# Patient Record
Sex: Female | Born: 1945
Health system: Southern US, Community
[De-identification: ages and names within clinical notes are randomized; demographics above are authoritative.]

## PROBLEM LIST (undated history)

## (undated) DIAGNOSIS — B029 Zoster without complications: Secondary | ICD-10-CM

## (undated) DIAGNOSIS — R000 Tachycardia, unspecified: Secondary | ICD-10-CM

## (undated) DIAGNOSIS — I4711 Inappropriate sinus tachycardia, so stated: Secondary | ICD-10-CM

## (undated) DIAGNOSIS — K529 Noninfective gastroenteritis and colitis, unspecified: Secondary | ICD-10-CM

## (undated) DIAGNOSIS — M109 Gout, unspecified: Secondary | ICD-10-CM

## (undated) DIAGNOSIS — K219 Gastro-esophageal reflux disease without esophagitis: Secondary | ICD-10-CM

## (undated) DIAGNOSIS — H409 Unspecified glaucoma: Secondary | ICD-10-CM

## (undated) DIAGNOSIS — Z85828 Personal history of other malignant neoplasm of skin: Secondary | ICD-10-CM

## (undated) DIAGNOSIS — M1990 Unspecified osteoarthritis, unspecified site: Secondary | ICD-10-CM

## (undated) DIAGNOSIS — F419 Anxiety disorder, unspecified: Secondary | ICD-10-CM

## (undated) DIAGNOSIS — Q872 Congenital malformation syndromes predominantly involving limbs: Secondary | ICD-10-CM

## (undated) HISTORY — DX: Noninfective gastroenteritis and colitis, unspecified: K52.9

## (undated) HISTORY — DX: Congenital malformation syndromes predominantly involving limbs: Q87.2

## (undated) HISTORY — DX: Tachycardia, unspecified: R00.0

## (undated) HISTORY — DX: Inappropriate sinus tachycardia, so stated: I47.11

## (undated) HISTORY — DX: Personal history of other malignant neoplasm of skin: Z85.828

## (undated) HISTORY — DX: Zoster without complications: B02.9

## (undated) HISTORY — DX: Gastro-esophageal reflux disease without esophagitis: K21.9

## (undated) HISTORY — PX: TONSILLECTOMY: SUR1361

## (undated) HISTORY — DX: Gout, unspecified: M10.9

## (undated) HISTORY — PX: CATARACT EXTRACTION: SUR2

## (undated) HISTORY — PX: KNEE SURGERY: SHX244

## (undated) HISTORY — PX: OTHER SURGICAL HISTORY: SHX169

---

## 1980-01-25 HISTORY — PX: CHOLECYSTECTOMY: SHX55

## 2006-05-29 ENCOUNTER — Encounter: Payer: Self-pay | Admitting: Cardiology

## 2007-06-04 ENCOUNTER — Encounter: Payer: Self-pay | Admitting: Cardiology

## 2007-07-10 ENCOUNTER — Ambulatory Visit: Payer: Self-pay | Admitting: Cardiology

## 2007-07-20 ENCOUNTER — Ambulatory Visit: Payer: Self-pay | Admitting: Cardiology

## 2007-08-09 ENCOUNTER — Ambulatory Visit: Payer: Self-pay | Admitting: Cardiology

## 2008-09-18 ENCOUNTER — Encounter (INDEPENDENT_AMBULATORY_CARE_PROVIDER_SITE_OTHER): Payer: Self-pay | Admitting: *Deleted

## 2010-06-08 NOTE — Assessment & Plan Note (Signed)
[facility]                          [facility] OFFICE NOTE   Kathryn, Meyers                     MRN:          [id number]  DATE:07/10/2007                            DOB:          1945-03-27    Kathryn Meyers is here for the evaluation of increased heart rate.  Historically, she has had an increased resting heart rate.  For overall  evaluation in 2004, she had a 2D echo showing normal LV function.  She  also had a Cardiolite scan.  At the time of that scan, her resting heart  rate was 100.  She exercised to a heart rate of 105% predicted max.  Ejection fraction was 63%, and there were no ischemic abnormalities.   The patient does note some palpitations at times.  However, in general,  she is used to her heart rate.  She says that many many years ago, she  took some shots from a doctor in [place] practicing alternative  medicine and that this helped her heart rate.  However, this was some  type of vaccine that is no longer available.  I made it clear to her  that I could not help with any recommendations in this regard.  Dr.  Margo Common has recommended low-dose beta blockade.  The patient is hesitant  to take any medication that she has to take regularly for this at this  time unless we know why she is having it.  She does not have chest pain.  She does not have syncope.  There is no significant shortness of breath.  I do not have any thyroid studies, but she tells me she thinks they have  been checked through Dr. Margo Common.  Certainly, this would be indicated if  they have not been.  I do not want to repeat any lab studies that she  has already had.   ALLERGIES:  No known drug allergies.   MEDICATIONS:  Sulfamethoxazole for an ongoing respiratory infection that  has been treated with other meds also.   SOCIAL HISTORY:  The patient does smoke.  She smokes one-pack per day.  We had a discussion concerning stopping.  She has not inclined to  do so.   FAMILY HISTORY:  There is a no strong history of coronary artery  disease.   REVIEW OF SYSTEMS:  In general, she does well.  She has some mild  arthritis and some mild anxiety.  She has some mild allergy symptoms.  Otherwise, review of systems is negative.   PHYSICAL EXAMINATION:  VITAL SIGNS:  Blood pressure today is 117/79 with  pulse of 101.  Her weight is 133.  The patient is oriented to person,  time, and place.  Affect is normal.  HEENT:  No xanthelasma.  She has normal extraocular motion.  There are  no carotid bruits.  There is no jugular venous distention.  LUNGS:  Clear.  Respiratory effort is not labored.  CARDIAC:  S1 and S2.  There are no clicks or significant murmurs.  ABDOMEN:  Soft.  EXTREMITIES:  She has no significant peripheral edema.  There are no  major musculoskeletal deformities.   EKG reveals sinus tachycardia with a rate of 102 and nonspecific ST-T  wave changes.   PROBLEMS:  1. Status post tonsillectomy, cholecystectomy, right and left knee      surgery, and C-sections.  2. History of ongoing cigarette use.  Of course, she is encouraged and      counseled to stop smoking.  3. Some mild arthritis.  4. Mild anxiety.  5. *Persistent sinus tachycardia.  By history, she has had this      problem for many years.  In 2004, she had good LV function.  I      discussed with her that in general we would like for her overall      heart rate to be slower.  I told her I could not state any      literature, however, that a heart rate of 100 would necessarily      lead to a cardiomyopathy.  She says that a monitor at one point had      shown that at other parts of the day her heart rate gets slower.      We discussed the fact that a 24-hour monitor would help assess what      her range of heart rate is at this time, so that we could make the      best decisions possible.  Also, a 2D echo would be helpful to be      sure that she has not developed any changes  in her LV function.      These studies will be arranged.  We will see if we can get a TSH      value from Dr. Margo Common.  I told her it will be okay for her to use      the beta blocker on a p.r.n. basis if she feels some palpitations.      I will then see her back, and we will make further decisions about      the approach to her tachycardia.     Luis Abed, MD, [facility]  Electronically Signed    JDK/MedQ  DD: 07/10/2007  DT: 07/11/2007  Job #: [id number]   cc:   Wyvonnia Lora

## 2010-06-08 NOTE — Assessment & Plan Note (Signed)
[facility]                          [facility]   SHENANDOAH, YEATS                     MRN:          [id number]  DATE:08/09/2007                            DOB:          February 27, 1945    Kathryn Meyers is seen for followup.  I saw her last on July 10, 2007.  We  did a 2D echo, and fortunately, she has the continuation of normal LV  function.  I was worried that she could have a cardiomyopathy from her  persistent sinus tachycardia.  This is not the case.  Her ejection  fraction is 60%-65%.  We also did a Holter monitor.  This documents her  heart rates.  She definitely has persistent sinus tachycardia.  Her mean  heart rate for the entire 24-hour period was 111.  It can range in the  range of 115 during the day and at nighttime, the slowest mean rate was  in the range of 89-90.   I had a full discussion with the patient about this today.  She is doing  well.  There is no evidence that she has a rate-related cardiomyopathy.  I told her that in my opinion, I would still like for her to use a beta  blocker.  We will consider using metoprolol succinate 25 mg daily.  We  had a full discussion about this, and once again, we talked about my  request that she stop smoking.   PAST MEDICAL HISTORY:   ALLERGIES:  No known drug allergies.   MEDICATIONS:  Fish oil and multivitamin.   OTHER MEDICAL PROBLEMS:  See the note of July 10, 2007.   REVIEW OF SYSTEMS:  Negative and she feels fine.   PHYSICAL EXAMINATION:  VITAL SIGNS:  Blood pressure today is 127/82, and  her heart rate is 111.  Weight is 136 pounds.  The patient is stable.  LUNGS:  Clear.  CARDIAC:  Reveals an S1 with an S2, but no clicks or significant  murmurs.   Problems are listed on the note of July 10, 2007.  We had a complete  discussion about her persistent sinus tachycardia.  No further workup is  to be done.  I have recommended low-dose long-acting beta  blockade.     Luis Abed, MD, [facility]  Electronically Signed    JDK/MedQ  DD: 08/09/2007  DT: 08/10/2007  Job #: [id number]   cc:   Wyvonnia Lora

## 2010-07-22 ENCOUNTER — Ambulatory Visit (HOSPITAL_COMMUNITY)
Admission: RE | Admit: 2010-07-22 | Discharge: 2010-07-22 | Disposition: A | Discharge status: Home / Self Care | Payer: BC Managed Care – PPO | Source: Ambulatory Visit | Attending: Orthopedic Surgery | Admitting: Orthopedic Surgery

## 2010-07-22 DIAGNOSIS — Z01818 Encounter for other preprocedural examination: Secondary | ICD-10-CM | POA: Insufficient documentation

## 2010-07-22 DIAGNOSIS — IMO0002 Reserved for concepts with insufficient information to code with codable children: Secondary | ICD-10-CM | POA: Insufficient documentation

## 2010-07-22 DIAGNOSIS — Z01812 Encounter for preprocedural laboratory examination: Secondary | ICD-10-CM | POA: Insufficient documentation

## 2010-07-22 DIAGNOSIS — M1710 Unilateral primary osteoarthritis, unspecified knee: Secondary | ICD-10-CM | POA: Insufficient documentation

## 2010-07-22 LAB — CBC
MCH: 29.9 pg (ref 26.0–34.0)
MCHC: 34.2 g/dL (ref 30.0–36.0)
Platelets: 330 10*3/uL (ref 150–400)
RBC: 5.22 MIL/uL — ABNORMAL HIGH (ref 3.87–5.11)
RDW: 13.9 % (ref 11.5–15.5)

## 2010-07-22 LAB — COMPREHENSIVE METABOLIC PANEL
BUN: 8 mg/dL (ref 6–23)
Calcium: 8.8 mg/dL (ref 8.4–10.5)
Creatinine, Ser: 0.47 mg/dL — ABNORMAL LOW (ref 0.50–1.10)
GFR calc Af Amer: >60 mL/min (ref >60)
GFR calc non Af Amer: >60 mL/min (ref >60)
Glucose, Bld: 80 mg/dL (ref 70–99)
Sodium: 142 mEq/L (ref 135–145)
Total Protein: 6.8 g/dL (ref 6.0–8.3)

## 2010-07-22 LAB — DIFFERENTIAL
Basophils Relative: 1 % (ref 0–1)
Eosinophils Absolute: 0.3 10*3/uL (ref 0.0–0.7)
Eosinophils Relative: 2 % (ref 0–5)
Monocytes Relative: 6 % (ref 3–12)
Neutrophils Relative %: 62 % (ref 43–77)

## 2010-07-22 LAB — SURGICAL PCR SCREEN: MRSA, PCR: POSITIVE — AB

## 2010-07-22 LAB — URINALYSIS, ROUTINE W REFLEX MICROSCOPIC
Hgb urine dipstick: NEGATIVE
Ketones, ur: NEGATIVE mg/dL
Protein, ur: NEGATIVE mg/dL
Urobilinogen, UA: 0.2 mg/dL (ref 0.0–1.0)

## 2010-07-22 LAB — PROTIME-INR: Prothrombin Time: 12.7 seconds (ref 11.6–15.2)

## 2010-07-24 LAB — URINE CULTURE

## 2010-08-02 ENCOUNTER — Inpatient Hospital Stay (HOSPITAL_COMMUNITY)
Admission: RE | Admit: 2010-08-02 | Payer: BC Managed Care – PPO | Source: Ambulatory Visit | Admitting: Orthopedic Surgery

## 2011-03-10 ENCOUNTER — Encounter: Payer: Self-pay | Admitting: Cardiology

## 2011-03-10 DIAGNOSIS — Z720 Tobacco use: Secondary | ICD-10-CM | POA: Insufficient documentation

## 2011-03-10 DIAGNOSIS — R000 Tachycardia, unspecified: Secondary | ICD-10-CM | POA: Insufficient documentation

## 2011-03-11 ENCOUNTER — Encounter: Payer: Self-pay | Admitting: Cardiology

## 2011-03-11 ENCOUNTER — Ambulatory Visit (INDEPENDENT_AMBULATORY_CARE_PROVIDER_SITE_OTHER): Payer: BC Managed Care – PPO | Admitting: Cardiology

## 2011-03-11 VITALS — BP 143/88 | HR 112 | Ht <= 58 in | Wt 140.0 lb

## 2011-03-11 DIAGNOSIS — Q872 Congenital malformation syndromes predominantly involving limbs: Secondary | ICD-10-CM

## 2011-03-11 DIAGNOSIS — R002 Palpitations: Secondary | ICD-10-CM

## 2011-03-11 DIAGNOSIS — R9431 Abnormal electrocardiogram [ECG] [EKG]: Secondary | ICD-10-CM

## 2011-03-11 DIAGNOSIS — Q798 Other congenital malformations of musculoskeletal system: Secondary | ICD-10-CM

## 2011-03-11 DIAGNOSIS — Z720 Tobacco use: Secondary | ICD-10-CM

## 2011-03-11 DIAGNOSIS — R000 Tachycardia, unspecified: Secondary | ICD-10-CM

## 2011-03-11 DIAGNOSIS — I498 Other specified cardiac arrhythmias: Secondary | ICD-10-CM

## 2011-03-11 DIAGNOSIS — J329 Chronic sinusitis, unspecified: Secondary | ICD-10-CM

## 2011-03-11 DIAGNOSIS — F17200 Nicotine dependence, unspecified, uncomplicated: Secondary | ICD-10-CM

## 2011-03-11 MED ORDER — AMOXICILLIN 500 MG PO CAPS: 500.0000 mg | ORAL_CAPSULE | Freq: Two times a day (BID) | ORAL | Status: AC

## 2011-03-11 MED ORDER — METOPROLOL TARTRATE 50 MG PO TABS: 50.0000 mg | ORAL_TABLET | Freq: Two times a day (BID) | ORAL | Status: DC

## 2011-03-11 NOTE — Patient Instructions (Signed)
Follow up as scheduled. Stop Atenolol. Start Lopressor (metoprolol) 50 mg two times a day. Decrease caffeine. Your physician discussed the hazards of tobacco use. Tobacco use cessation is recommended and techniques and options to help you quit were discussed.

## 2011-03-11 NOTE — Assessment & Plan Note (Signed)
The patient's white count was elevated in the emergency room although she has no fever. She says she has symptoms of a sinus infection. I was willing to give her a trial of amoxicillin as it has been used in the past. She will see her primary physician for any further followup.

## 2011-03-11 NOTE — Assessment & Plan Note (Signed)
I have urged the patient to stop smoking. We discussed at length. Her husband was in the room and he does not smoke.

## 2011-03-11 NOTE — Assessment & Plan Note (Signed)
There is a history of very mild persistent sinus tachycardia that she has treated with beta blockers for many years. I am adjusting her beta blocker. I will reconsider 2-D echo to followup again to be sure that she has not developed a rate related cardiomyopathy. We know that this was not the case between 2004 and 2009

## 2011-03-11 NOTE — Assessment & Plan Note (Signed)
I was not familiar with this syndrome until the patient mentioned it to me. I have researched it. It is associated with abnormal nails of the hands and of abnormal patellas. In a small percentage of the patient's 13 be significant renal dysfunction. Therefore very careful attention has to be paid to not using any medications that can affect renal function. I cannot find any evidence of cardiac involvement in this autosomal dominant abnormality

## 2011-03-11 NOTE — Assessment & Plan Note (Signed)
The patient has ST scooping throughout her EKG. This is an old finding. It is unchanged.

## 2011-03-11 NOTE — Assessment & Plan Note (Signed)
The patient's primary complaint is palpitations. She has had excess caffeine intake. With this she gets sinus tachycardia. I suspect she also has some PACs or PVCs. I will change her atenolol to Lopressor 50 by mouth twice a day. She is to cut down her caffeine intake. I her sugar to stop smoking. We talked about the possibility of proceeding with a 2-D echo and an event recorder. However I do not feel either of these are immediately necessary as we have data from the past. She was willing to see how she feels with decreased caffeine and adjustment to her beta blocker. I will see her for followup and we will make further decisions.

## 2011-03-11 NOTE — Progress Notes (Signed)
HPI Patient is seen today to evaluate Palpitations. I had seen her in 2004 and 2009. She had Sinus tachycardia at that time.. She's had normal LV function and her ejection fraction in 2004 was 63% By echo.. She had a stress Nuclear scan in 2004  that showed no significant abnormalities. With recurrent problems in 2009 she had a followup echo again showing normal function with an ejection fraction of 60%. She wore a Holter monitor. She had persistent sinus tachycardia. The rates range from 90-115 with an average of about 105. I recommended that we try a small dose of beta blocker at that time. She has been on atenolol.  The patient was scheduled to see me today. Yesterday she went to the ER with some recurrent palpitations. In the ER she had some sinus tachycardia. Her potassium was 3.4. She was felt to be stable. Her initial EKG revealed sinus tachycardia with a rate of 120. Over time this slowed to a rate of 90. She does have nonspecific ST scooping on her EKG. Chest x-ray revealed slight hyperinflation. She says that she has felt palpitations in general over the past few weeks. She has not had any syncope or presyncope.  As part of today's evaluation I have reviewed old records from the old chart of 2009. I've carefully up dated the new electronic medical record. I have reviewed the hospital records from yesterday including the notes and x-ray and labs. I also spent greater than an hour talking with the patient and her husband and reviewing all the data.  An additional problem is that the patient thinks she has sinusitis. Her white blood count was mildly elevated in the emergency room yesterday.  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 Patient's have absent or abnormal fingernails. There is absent hypoplastic patella of the knee. Elbow joints can be abnormal. A small percentage of the patients can have significant renal dysfunction. Results for orders  placed during the hospital encounter of 07/22/10  URINALYSIS, ROUTINE W REFLEX MICROSCOPIC      Component Value Range   Color, Urine YELLOW  YELLOW    APPearance CLEAR  CLEAR    Specific Gravity, Urine 1.016  1.005 - 1.030    pH 6.0  5.0 - 8.0    Glucose, UA NEGATIVE  NEGATIVE (mg/dL)   Hgb urine dipstick NEGATIVE  NEGATIVE    Bilirubin Urine NEGATIVE  NEGATIVE    Ketones, ur NEGATIVE  NEGATIVE (mg/dL)   Protein, ur NEGATIVE  NEGATIVE (mg/dL)   Urobilinogen, UA 0.2  0.0 - 1.0 (mg/dL)   Nitrite NEGATIVE  NEGATIVE    Leukocytes, UA NEGATIVE  NEGATIVE   DIFFERENTIAL      Component Value Range   Neutrophils Relative 62  43 - 77 (%)   Neutro Abs 7.6  1.7 - 7.7 (K/uL)   Lymphocytes Relative 28  12 - 46 (%)   Lymphs Abs 3.4  0.7 - 4.0 (K/uL)   Monocytes Relative 6  3 - 12 (%)   Monocytes Absolute 0.8  0.1 - 1.0 (K/uL)   Eosinophils Relative 2  0 - 5 (%)   Eosinophils Absolute 0.3  0.0 - 0.7 (K/uL)   Basophils Relative 1  0 - 1 (%)   Basophils Absolute 0.1  0.0 - 0.1 (K/uL)  CBC      Component Value Range   WBC 12.1 (*) 4.0 - 10.5 (K/uL)   RBC 5.22 (*) 3.87 - 5.11 (MIL/uL)  Hemoglobin 15.6 (*) 12.0 - 15.0 (g/dL)   HCT 09.6  04.5 - 40.9 (%)   MCV 87.4  78.0 - 100.0 (fL)   MCH 29.9  26.0 - 34.0 (pg)   MCHC 34.2  30.0 - 36.0 (g/dL)   RDW 81.1  91.4 - 78.2 (%)   Platelets 330  150 - 400 (K/uL)  PROTIME-INR      Component Value Range   Prothrombin Time 12.7  11.6 - 15.2 (seconds)   INR 0.93  0.00 - 1.49   APTT      Component Value Range   aPTT 29  24 - 37 (seconds)  COMPREHENSIVE METABOLIC PANEL      Component Value Range   Sodium 142  135 - 145 (mEq/L)   Potassium 3.6  3.5 - 5.1 (mEq/L)   Chloride 104  96 - 112 (mEq/L)   CO2 29  19 - 32 (mEq/L)   Glucose, Bld 80  70 - 99 (mg/dL)   BUN 8  6 - 23 (mg/dL)   Creatinine, Ser 9.56 (*) 0.50 - 1.10 (mg/dL)   Calcium 8.8  8.4 - 21.3 (mg/dL)   Total Protein 6.8  6.0 - 8.3 (g/dL)   Albumin 3.8  3.5 - 5.2 (g/dL)   AST 22  0 - 37  (U/L)   ALT 26  0 - 35 (U/L)   Alkaline Phosphatase 117  39 - 117 (U/L)   Total Bilirubin 0.3  0.3 - 1.2 (mg/dL)   GFR calc non Af Amer >60  >60 (mL/min)   GFR calc Af Amer >60  >60 (mL/min)  SURGICAL PCR SCREEN      Component Value Range   MRSA, PCR POSITIVE (*) NEGATIVE    Staphylococcus aureus POSITIVE (*) NEGATIVE   URINE CULTURE      Component Value Range   Specimen Description URINE, CLEAN CATCH     Special Requests NONE     Culture  Setup Time [id number]     Colony Count 15,000 COLONIES/ML     Culture       Value: Multiple bacterial morphotypes present, none predominant. Suggest appropriate recollection if clinically indicated.   Report Status 07/24/2010 FINAL      Allergies  Allergen Reactions  . Nsaids     Current Outpatient Prescriptions  Medication Sig Dispense Refill  . atenolol (TENORMIN) 50 MG tablet Take 50 mg by mouth daily.        History   Social History  . Marital Status: Married    Spouse Name: N/A    Number of Children: N/A  . Years of Education: N/A   Occupational History  . Not on file.   Social History Main Topics  . Smoking status: Current Everyday Smoker -- 1.0 packs/day    Types: Cigarettes  . Smokeless tobacco: Never Used  . Alcohol Use: No  . Drug Use: No  . Sexually Active: Not on file   Other Topics Concern  . Not on file   Social History Narrative  . No narrative on file    No family history on file.  Past Medical History  Diagnosis Date  . Ejection fraction     EF 60-65%, 2009  . Sinus tachycardia     Mild persistent sinus tachycardia 2009,   . Tobacco abuse     No past surgical history on file.  ROS  Patient denies fever, chills, headache, sweats, rash, change in vision, change in hearing, chest pain, cough, nausea vomiting, urinary symptoms. All other  systems are reviewed and are negative.  PHYSICAL EXAM Patient is overweight. She is oriented to person time and place. Affect is normal. She is here with her  husband. Head is atraumatic. There is no xanthelasma. There is no jugular venous distention. Lungs are clear. Respiratory effort is nonlabored. Cardiac exam reveals S1 and S2. There no clicks or significant murmurs. The abdomen is soft. There is no peripheral edema. There no skin rashes. The patient does have an absent thumb nail. She has prosthetic nails. She does not have a patella. Her elbow joints are abnormal. Filed Vitals:   03/11/11 1320  BP: 143/88  Pulse: 112  Height: 4\' 10"  (1.473 m)  Weight: 140 lb (63.504 kg)   EKG was not done in the office today. I have to EKG from the emergency room from March 10, 2011. The first EKG reveals sinus tachycardia. Second EKG shows normal sinus rhythm. There is diffuse ST scooping. This is unchanged since the tracing of 2009.  ASSESSMENT & PLAN

## 2011-04-06 ENCOUNTER — Ambulatory Visit: Payer: BC Managed Care – PPO | Admitting: Cardiology

## 2011-04-08 ENCOUNTER — Telehealth: Payer: Self-pay | Admitting: *Deleted

## 2011-04-08 NOTE — Telephone Encounter (Signed)
Pt left a message on nurse's voicemail asking for a return call regarding what medications she can take with her metoprolol.   Spoke with pt who states she thinks she is getting congestion in her chest. She wants to know what she can take with the metoprolol. Pt specifically asking if she can take pseudoephedrine. Pt notified there should w/her h/o ST she may want to avoid pseudoephedrine as it may make her heart race. Pt verbalized understanding.

## 2011-04-14 ENCOUNTER — Ambulatory Visit: Payer: BC Managed Care – PPO | Admitting: Cardiology

## 2011-06-01 ENCOUNTER — Ambulatory Visit: Payer: BC Managed Care – PPO | Admitting: Cardiology

## 2011-06-07 ENCOUNTER — Ambulatory Visit (INDEPENDENT_AMBULATORY_CARE_PROVIDER_SITE_OTHER): Payer: BC Managed Care – PPO | Admitting: Cardiology

## 2011-06-07 ENCOUNTER — Encounter: Payer: Self-pay | Admitting: Cardiology

## 2011-06-07 VITALS — BP 144/87 | HR 109 | Resp 16 | Ht <= 58 in | Wt 141.0 lb

## 2011-06-07 DIAGNOSIS — Z720 Tobacco use: Secondary | ICD-10-CM

## 2011-06-07 DIAGNOSIS — F17200 Nicotine dependence, unspecified, uncomplicated: Secondary | ICD-10-CM

## 2011-06-07 DIAGNOSIS — R000 Tachycardia, unspecified: Secondary | ICD-10-CM

## 2011-06-07 DIAGNOSIS — R002 Palpitations: Secondary | ICD-10-CM

## 2011-06-07 DIAGNOSIS — I498 Other specified cardiac arrhythmias: Secondary | ICD-10-CM

## 2011-06-07 NOTE — Assessment & Plan Note (Signed)
Patient continues to smoke. As always she is encouraged to stop.

## 2011-06-07 NOTE — Assessment & Plan Note (Signed)
She is not having any marked palpitations.

## 2011-06-07 NOTE — Progress Notes (Signed)
   HPI  Patient is seen today to followup palpitations and sinus tachycardia. I had changed her atenolol to Lopressor. She is still only on small dose. She says she's having some mild dizziness. This sounds mostly like mild vertigo. I do not think it is related to the Lopressor or her cardiac status in general. The patient takes several supplements. We have reviewed some of them . I've encouraged her to use as little as possible.  Allergies  Allergen Reactions  . Nsaids     Current Outpatient Prescriptions  Medication Sig Dispense Refill  . Acetaminophen (PAIN RELIEF PO) Take by mouth as needed.      . metoprolol (LOPRESSOR) 50 MG tablet Take 1 tablet (50 mg total) by mouth 2 (two) times daily.  60 tablet  6  . naproxen sodium (ANAPROX) 220 MG tablet Take 220 mg by mouth as needed.        History   Social History  . Marital Status: Married    Spouse Name: N/A    Number of Children: N/A  . Years of Education: N/A   Occupational History  . Not on file.   Social History Main Topics  . Smoking status: Current Everyday Smoker -- 1.0 packs/day    Types: Cigarettes  . Smokeless tobacco: Never Used  . Alcohol Use: No  . Drug Use: No  . Sexually Active: Not on file   Other Topics Concern  . Not on file   Social History Narrative  . No narrative on file    No family history on file.  Past Medical History  Diagnosis Date  . Ejection fraction     EF 60-65%, 2009  . Sinus tachycardia     Mild persistent sinus tachycardia 2009,   . Tobacco abuse   . Palpitations     February, 2013  . Sinus infection     February, 2013  . Nail patellar syndrome     Abnormal nails of the hands usually the thumb, absent or hypertrophic patella,  abnormal elbows, autosomal dominant, must be very careful with renal function  . Abnormal EKG     Old diffuse ST scooping    No past surgical history on file.  ROS  Patient denies fever, chills, headache, sweats, rash, change in vision, change  in hearing, chest pain, cough, nausea vomiting, urinary symptoms. All of the systems are reviewed and are negative.  PHYSICAL EXAM  Patient is here with her husband. She is oriented to person time and place. Affect is normal. Head is atraumatic. There is no jugulovenous distention. There no carotid bruits. Lungs are clear. Respiratory effort is nonlabored. Cardiac exam reveals S1 and S2. There no clicks or significant murmurs. The abdomen is soft. There's no peripheral edema.  Filed Vitals:   06/07/11 1436  BP: 144/87  Pulse: 109  Resp: 16  Height: 4\' 10"  (1.473 m)  Weight: 141 lb (63.957 kg)    ASSESSMENT & PLAN

## 2011-06-07 NOTE — Assessment & Plan Note (Signed)
Patient continues to have mild persistent sinus tachycardia. Would like to try will higher dose of beta blocker but she is hesitant. I do want to proceed with 2-D echo to be sure that were not seen left ventricular dysfunction. Otherwise no change.

## 2011-06-07 NOTE — Patient Instructions (Signed)
Your physician recommends that you schedule a follow-up appointment in: 3-5 weeks with Dr. Myrtis Ser. You will be given an appointment at the check out desk today.  Your physician recommends that you continue on your current medications as directed. Please refer to the Current Medication list given to you today.   Your physician has requested that you have an echocardiogram. Echocardiography is a painless test that uses sound waves to create images of your heart. It provides your doctor with information about the size and shape of your heart and how well your heart's chambers and valves are working. This procedure takes approximately one hour. There are no restrictions for this procedure.

## 2011-06-16 ENCOUNTER — Other Ambulatory Visit: Payer: BC Managed Care – PPO

## 2011-06-23 ENCOUNTER — Other Ambulatory Visit (INDEPENDENT_AMBULATORY_CARE_PROVIDER_SITE_OTHER): Payer: BC Managed Care – PPO

## 2011-06-23 ENCOUNTER — Other Ambulatory Visit: Payer: Self-pay

## 2011-06-23 DIAGNOSIS — R002 Palpitations: Secondary | ICD-10-CM

## 2011-06-23 DIAGNOSIS — R000 Tachycardia, unspecified: Secondary | ICD-10-CM

## 2011-07-01 ENCOUNTER — Telehealth: Payer: Self-pay | Admitting: *Deleted

## 2011-07-01 NOTE — Telephone Encounter (Signed)
Patient informed via message machine and verbally.

## 2011-07-01 NOTE — Telephone Encounter (Signed)
Message copied by Eustace Moore on Fri Jul 01, 2011  9:30 AM ------      Message from: Myrtis Ser, [place] D      Created: Wed Jun 29, 2011  5:18 PM       Echo result is good

## 2011-07-12 ENCOUNTER — Encounter: Payer: Self-pay | Admitting: Cardiology

## 2011-07-12 DIAGNOSIS — R9430 Abnormal result of cardiovascular function study, unspecified: Secondary | ICD-10-CM | POA: Insufficient documentation

## 2011-07-13 ENCOUNTER — Ambulatory Visit (INDEPENDENT_AMBULATORY_CARE_PROVIDER_SITE_OTHER): Payer: BC Managed Care – PPO | Admitting: Cardiology

## 2011-07-13 ENCOUNTER — Encounter: Payer: Self-pay | Admitting: Cardiology

## 2011-07-13 VITALS — BP 126/82 | HR 83 | Ht <= 58 in | Wt 141.8 lb

## 2011-07-13 DIAGNOSIS — I498 Other specified cardiac arrhythmias: Secondary | ICD-10-CM

## 2011-07-13 DIAGNOSIS — R000 Tachycardia, unspecified: Secondary | ICD-10-CM

## 2011-07-13 DIAGNOSIS — Z720 Tobacco use: Secondary | ICD-10-CM

## 2011-07-13 DIAGNOSIS — F17200 Nicotine dependence, unspecified, uncomplicated: Secondary | ICD-10-CM

## 2011-07-13 NOTE — Assessment & Plan Note (Signed)
Her rate today is 90. This is good for her. I have encouraged her to stay on her low dose Lopressor. We have discussed the safety of this drug. She will continue it.

## 2011-07-13 NOTE — Patient Instructions (Addendum)
No follow up appointment needed.  Your physician recommends that you continue on your current medications as directed. Please refer to the Current Medication list given to you today.

## 2011-07-13 NOTE — Progress Notes (Signed)
   HPI  Patient is seen to followup tachycardia. I saw her last Jun 07, 2011. She has a long history of sinus tachycardia.I had changed her atenolol to Lopressor. She tolerates this relatively well. She has some mild dizziness that is from probable vertigo. When I saw her we decided to obtain a 2-D echo to assess her LV function. This was done in her EF is 60-65%.  Allergies  Allergen Reactions  . Nsaids     Current Outpatient Prescriptions  Medication Sig Dispense Refill  . Acetaminophen (PAIN RELIEF PO) Take by mouth as needed.      . metoprolol (LOPRESSOR) 50 MG tablet Take 1 tablet (50 mg total) by mouth 2 (two) times daily.  60 tablet  6  . naproxen sodium (ANAPROX) 220 MG tablet Take 220 mg by mouth as needed.        History   Social History  . Marital Status: Married    Spouse Name: N/A    Number of Children: N/A  . Years of Education: N/A   Occupational History  . Not on file.   Social History Main Topics  . Smoking status: Current Everyday Smoker -- 1.0 packs/day    Types: Cigarettes  . Smokeless tobacco: Never Used  . Alcohol Use: No  . Drug Use: No  . Sexually Active: Not on file   Other Topics Concern  . Not on file   Social History Narrative  . No narrative on file    No family history on file.  Past Medical History  Diagnosis Date  . Ejection fraction     EF 60-65%, 2009  //   EF 60-65%, no wall motion abnormalities, echo, Jun 23, 2011  . Sinus tachycardia     Mild persistent sinus tachycardia 2009,   . Tobacco abuse   . Palpitations     February, 2013  . Sinus infection     February, 2013  . Nail patellar syndrome     Abnormal nails of the hands usually the thumb, absent or hypertrophic patella,  abnormal elbows, autosomal dominant, must be very careful with renal function  . Abnormal EKG     Old diffuse ST scooping    No past surgical history on file.  ROS  Patient denies fever, chills, headache, sweats, rash, change in vision, change in  hearing, chest pain, cough, nausea vomiting, urinary symptoms. All other systems are reviewed and are negative.  PHYSICAL EXAM  Patients here with her husband. She is oriented to person time and place. Affect is normal. Lungs are clear. Respiratory effort is nonlabored. There is no jugulovenous distention. Cardiac exam reveals an S1 and S2. There no clicks or significant murmurs. The abdomen is soft. She is no peripheral edema.  Filed Vitals:   07/13/11 1331  BP: 126/82  Pulse: 83  Height: 4\' 10"  (1.473 m)  Weight: 141 lb 12.8 oz (64.32 kg)    ASSESSMENT & PLAN

## 2011-07-13 NOTE — Assessment & Plan Note (Signed)
I have again encouraged the patient to stop smoking.

## 2011-07-26 ENCOUNTER — Other Ambulatory Visit: Payer: Self-pay | Admitting: Family Medicine

## 2011-07-26 DIAGNOSIS — R921 Mammographic calcification found on diagnostic imaging of breast: Secondary | ICD-10-CM

## 2011-08-04 ENCOUNTER — Ambulatory Visit
Admission: RE | Admit: 2011-08-04 | Discharge: 2011-08-04 | Disposition: A | Discharge status: Home / Self Care | Payer: BC Managed Care – PPO | Source: Ambulatory Visit | Attending: Family Medicine | Admitting: Family Medicine

## 2011-08-04 DIAGNOSIS — R921 Mammographic calcification found on diagnostic imaging of breast: Secondary | ICD-10-CM

## 2011-10-10 ENCOUNTER — Other Ambulatory Visit: Payer: Self-pay | Admitting: Cardiology

## 2011-10-10 NOTE — Telephone Encounter (Signed)
Patient called regarding medication.  Please call when done.

## 2011-11-08 ENCOUNTER — Other Ambulatory Visit: Payer: Self-pay | Admitting: *Deleted

## 2011-11-08 MED ORDER — METOPROLOL TARTRATE 50 MG PO TABS: 50.0000 mg | ORAL_TABLET | Freq: Two times a day (BID) | ORAL | Status: DC

## 2012-07-04 ENCOUNTER — Encounter: Payer: Self-pay | Admitting: Cardiology

## 2013-01-02 ENCOUNTER — Other Ambulatory Visit: Payer: Self-pay | Admitting: Cardiology

## 2013-04-03 ENCOUNTER — Other Ambulatory Visit: Payer: Self-pay | Admitting: Cardiology

## 2013-04-03 ENCOUNTER — Other Ambulatory Visit: Payer: Self-pay

## 2013-04-03 MED ORDER — METOPROLOL TARTRATE 50 MG PO TABS: ORAL_TABLET | ORAL | Status: DC

## 2013-04-12 ENCOUNTER — Encounter: Payer: Self-pay | Admitting: Cardiology

## 2013-04-12 ENCOUNTER — Ambulatory Visit (INDEPENDENT_AMBULATORY_CARE_PROVIDER_SITE_OTHER): Payer: Medicare Other | Admitting: Cardiology

## 2013-04-12 VITALS — BP 149/81 | HR 95 | Ht <= 58 in | Wt 150.0 lb

## 2013-04-12 DIAGNOSIS — R000 Tachycardia, unspecified: Secondary | ICD-10-CM

## 2013-04-12 DIAGNOSIS — Q872 Congenital malformation syndromes predominantly involving limbs: Secondary | ICD-10-CM

## 2013-04-12 DIAGNOSIS — Q798 Other congenital malformations of musculoskeletal system: Secondary | ICD-10-CM

## 2013-04-12 DIAGNOSIS — R002 Palpitations: Secondary | ICD-10-CM

## 2013-04-12 DIAGNOSIS — I498 Other specified cardiac arrhythmias: Secondary | ICD-10-CM

## 2013-04-12 MED ORDER — METOPROLOL TARTRATE 50 MG PO TABS: ORAL_TABLET | ORAL | Status: DC

## 2013-04-12 NOTE — Assessment & Plan Note (Signed)
She's not having any significant palpitations at this time. No change in therapy.

## 2013-04-12 NOTE — Assessment & Plan Note (Signed)
The patient has mild persistent sinus tachycardia since 2009. She does well with a beta blocker. No change in therapy.

## 2013-04-12 NOTE — Assessment & Plan Note (Signed)
Patient has a very uncommon syndrome that includes abnormal nails of the hands, usually the thumb. It can be associated with an abnormal patella and abnormal elbows.

## 2013-04-12 NOTE — Patient Instructions (Signed)

## 2013-04-12 NOTE — Progress Notes (Signed)
Patient ID: Kathryn Meyers, female   DOB: [birth year], 68 y.o.   MRN: [id number]    HPI  Patient is seen to followup a history of sinus tachycardia. I've seen her in the past for this. She has normal left ventricular function. We have found no etiology for her sinus tachycardia in the past. She does well with beta blockade.  Allergies  Allergen Reactions  . Nsaids     Current Outpatient Prescriptions  Medication Sig Dispense Refill  . Acetaminophen (PAIN RELIEF PO) Take by mouth as needed.      . metoprolol (LOPRESSOR) 50 MG tablet TAKE ONE TABLET BY MOUTH TWO TIMES A DAY  60 tablet  11  . naproxen sodium (ANAPROX) 220 MG tablet Take 220 mg by mouth as needed.       No current facility-administered medications for this visit.    History   Social History  . Marital Status: Married    Spouse Name: N/A    Number of Children: N/A  . Years of Education: N/A   Occupational History  . Not on file.   Social History Main Topics  . Smoking status: Current Every Day Smoker -- 1.00 packs/day    Types: Cigarettes  . Smokeless tobacco: Never Used  . Alcohol Use: No  . Drug Use: No  . Sexual Activity: Not on file   Other Topics Concern  . Not on file   Social History Narrative  . No narrative on file    No family history on file.  Past Medical History  Diagnosis Date  . Ejection fraction     EF 60-65%, 2009  //   EF 60-65%, no wall motion abnormalities, echo, Jun 23, 2011  . Sinus tachycardia     Mild persistent sinus tachycardia 2009,   . Tobacco abuse   . Palpitations     February, 2013  . Sinus infection     February, 2013  . Nail patellar syndrome     Abnormal nails of the hands usually the thumb, absent or hypertrophic patella,  abnormal elbows, autosomal dominant, must be very careful with renal function  . Abnormal EKG     Old diffuse ST scooping    History reviewed. No pertinent past surgical history.  Patient Active Problem List   Diagnosis Date Noted  .  Ejection fraction   . Nail patellar syndrome   . Abnormal EKG   . Palpitations   . Sinus infection   . Sinus tachycardia   . Tobacco abuse     ROS   Patient denies fever, chills, headache, sweats, rash, change in vision, change in hearing, chest pain, cough, nausea or vomiting, urinary symptoms. All other systems are reviewed and are negative.  PHYSICAL EXAM  Patient is oriented to person time and place. Affect is normal. She's here with her husband. There is no jugulovenous distention. Lungs are clear. Respiratory effort is nonlabored. Cardiac exam reveals S1 and S2. There no clicks or significant murmurs. The abdomen is soft. There is no peripheral edema.  Filed Vitals:   04/12/13 1531  BP: 149/81  Pulse: 95  Height: 4\' 8"  (1.422 m)  Weight: 150 lb (68.04 kg)    EKG is done today and reviewed by me. She has a heart rate at rest 95. There is a short PR. There are old nonspecific ST-T wave changes. This is unchanged from the past.  ASSESSMENT & PLAN

## 2013-12-09 ENCOUNTER — Other Ambulatory Visit: Payer: Self-pay | Admitting: *Deleted

## 2013-12-09 MED ORDER — METOPROLOL TARTRATE 50 MG PO TABS: ORAL_TABLET | ORAL | Status: DC

## 2013-12-09 NOTE — Telephone Encounter (Signed)
Metoprolol refill sent to [facility] 

## 2013-12-23 ENCOUNTER — Encounter: Payer: Self-pay | Admitting: Cardiology

## 2013-12-23 ENCOUNTER — Ambulatory Visit (INDEPENDENT_AMBULATORY_CARE_PROVIDER_SITE_OTHER): Payer: Medicare Other | Admitting: Cardiology

## 2013-12-23 VITALS — BP 176/90 | HR 97 | Ht <= 58 in | Wt 148.4 lb

## 2013-12-23 DIAGNOSIS — I471 Supraventricular tachycardia: Secondary | ICD-10-CM

## 2013-12-23 DIAGNOSIS — R0609 Other forms of dyspnea: Secondary | ICD-10-CM | POA: Insufficient documentation

## 2013-12-23 DIAGNOSIS — R002 Palpitations: Secondary | ICD-10-CM

## 2013-12-23 DIAGNOSIS — R000 Tachycardia, unspecified: Secondary | ICD-10-CM

## 2013-12-23 DIAGNOSIS — R42 Dizziness and giddiness: Secondary | ICD-10-CM

## 2013-12-23 DIAGNOSIS — R9431 Abnormal electrocardiogram [ECG] [EKG]: Secondary | ICD-10-CM

## 2013-12-23 MED ORDER — METOPROLOL TARTRATE 50 MG PO TABS: 50.0000 mg | ORAL_TABLET | Freq: Three times a day (TID) | ORAL | Status: DC

## 2013-12-23 NOTE — Patient Instructions (Signed)
**Note De-Identified Kathryn Meyers Obfuscation** Your physician has recommended you make the following change in your medication: increase Metoprolol as advised by Dr Ron Parker  Your physician recommends that you schedule a follow-up appointment in: 1 week at the [facility] office.

## 2013-12-23 NOTE — Assessment & Plan Note (Signed)
The patient has known vertigo from the past. I told her to try meclizine carefully.

## 2013-12-23 NOTE — Assessment & Plan Note (Signed)
I think she may have exertional shortness of breath because of increased heart rate when trying to walk. I will see how she feels on a higher dose of beta blocker. She may need a follow-up echo.

## 2013-12-23 NOTE — Progress Notes (Signed)
Patient ID: Kathryn Meyers, female   DOB: 1946/01/07, 68 y.o.   MRN: [id number]    HPI The patient called the [facility] office today to say that she was feeling increased heart rate and shortness of breath and dizziness. She and her husband chose to drive to the [place] office so that she could be seen today. Historically she has mild resting sinus tachycardia. She usually responds to her ongoing dose of metoprolol. Recently she feels that she has episodes of increased heart rate during the day. She also feels some exertional shortness of breath. She is not having PND or orthopnea. She also has dizziness that clearly sounds like vertigo. She has had this in the past. She did not one to take any meclizine until she saw me for further assessment. She does admit to high caffeine intake until recently. However she cut this back significantly more than 2 weeks ago and she still feels the increased heart rate.  Allergies  Allergen Reactions  . Nsaids     Current Outpatient Prescriptions  Medication Sig Dispense Refill  . Acetaminophen (PAIN RELIEF PO) Take by mouth as needed.    . metoprolol (LOPRESSOR) 50 MG tablet Take 1 tablet (50 mg total) by mouth 3 (three) times daily. 270 tablet 3  . naproxen sodium (ANAPROX) 220 MG tablet Take 220 mg by mouth as needed.     No current facility-administered medications for this visit.    History   Social History  . Marital Status: Married    Spouse Name: N/A    Number of Children: N/A  . Years of Education: N/A   Occupational History  . Not on file.   Social History Main Topics  . Smoking status: Current Every Day Smoker -- 1.00 packs/day    Types: Cigarettes  . Smokeless tobacco: Never Used  . Alcohol Use: No  . Drug Use: No  . Sexual Activity: Not on file   Other Topics Concern  . Not on file   Social History Narrative    History reviewed. No pertinent family history.  Past Medical History  Diagnosis Date  . Ejection fraction    EF 60-65%, 2009  //   EF 60-65%, no wall motion abnormalities, echo, Jun 23, 2011  . Sinus tachycardia     Mild persistent sinus tachycardia 2009,   . Tobacco abuse   . Palpitations     February, 2013  . Sinus infection     February, 2013  . Nail patellar syndrome     Abnormal nails of the hands usually the thumb, absent or hypertrophic patella,  abnormal elbows, autosomal dominant, must be very careful with renal function  . Abnormal EKG     Old diffuse ST scooping    History reviewed. No pertinent past surgical history.  Patient Active Problem List   Diagnosis Date Noted  . Vertigo 12/23/2013  . Ejection fraction   . Nail patellar syndrome   . Abnormal EKG   . Palpitations   . Sinus infection   . Sinus tachycardia   . Tobacco abuse     ROS  Patient denies fever, chills, headache, sweats, rash, change in vision, change in hearing, chest pain, cough, nausea or vomiting, urinary symptoms. All other systems are reviewed and are negative.  PHYSICAL EXAM Patient is here with her husband and a young child. She is oriented to person time and place. Affect is normal. She is overweight. Head is atraumatic. Sclera and conjunctiva are normal. There is no  jugular venous distention. Lungs are clear. Respiratory effort is nonlabored. Cardiac exam reveals S1 and S2. The abdomen is soft. There is no peripheral edema. There are no musculoskeletal deformities. During no skin rashes. Her heart rate today is 95.  Filed Vitals:   12/23/13 1511  BP: 176/90  Pulse: 97  Height: 4\' 8"  (1.422 m)  Weight: 148 lb 6.4 oz (67.314 kg)   EKG is done today and reviewed by me. She has sinus rhythm with a rate of 97. She has diffuse ST scooping. This is unchanged from the past.  ASSESSMENT & PLAN

## 2013-12-23 NOTE — Assessment & Plan Note (Signed)
The patient has old diffuse ST scooping on her EKG. There is no change.

## 2013-12-23 NOTE — Assessment & Plan Note (Signed)
The patient has mild persistent resting sinus tachycardia. She usually feels quite well with her beta blocker. It seems that she has some breakthrough periods during the day. She is able to get metoprolol tartrate very inexpensively. Therefore we will increase the dose to 3 times daily instead of changing her to metoprolol succinate or increasing the actual dose of twice a day metoprolol tartrate. I will see how this affects her feeling of increased heart rate and her exertional shortness of breath. I will be planning to see her back for very early follow-up. We will have to recheck her thyroid status at some point.

## 2013-12-31 ENCOUNTER — Encounter: Payer: Self-pay | Admitting: Cardiology

## 2013-12-31 ENCOUNTER — Ambulatory Visit (INDEPENDENT_AMBULATORY_CARE_PROVIDER_SITE_OTHER): Payer: Medicare Other | Admitting: Cardiology

## 2013-12-31 VITALS — BP 164/87 | HR 95 | Ht <= 58 in | Wt 150.4 lb

## 2013-12-31 DIAGNOSIS — I471 Supraventricular tachycardia: Secondary | ICD-10-CM

## 2013-12-31 DIAGNOSIS — R000 Tachycardia, unspecified: Secondary | ICD-10-CM

## 2013-12-31 DIAGNOSIS — R42 Dizziness and giddiness: Secondary | ICD-10-CM

## 2013-12-31 DIAGNOSIS — R0609 Other forms of dyspnea: Secondary | ICD-10-CM

## 2013-12-31 NOTE — Patient Instructions (Signed)
Your physician recommends that you schedule a follow-up appointment in: 8 weeks  Your physician recommends that you continue on your current medications as directed. Please refer to the Current Medication list given to you today.   

## 2013-12-31 NOTE — Progress Notes (Signed)
Patient ID: Kathryn Meyers, female   DOB: 02-15-1945, 68 y.o.   MRN: [id number]    HPI Patient is seen today to follow-up her tachycardia. I saw her last week and the [facility] office for palpitations. She has a history of resting sinus tachycardia. She's had significant stress. She's also had some vertigo. We decided to adjust her beta blocker up on a when necessary basis. She has done that. Also meclizine helped her vertigo. It is very clear from our discussion today that her sensation of tachycardia relates to her excess stress.  Allergies  Allergen Reactions  . Nsaids     Current Outpatient Prescriptions  Medication Sig Dispense Refill  . Acetaminophen (PAIN RELIEF PO) Take by mouth as needed.    . metoprolol (LOPRESSOR) 50 MG tablet Take 1 tablet (50 mg total) by mouth 3 (three) times daily. 270 tablet 3  . naproxen sodium (ANAPROX) 220 MG tablet Take 220 mg by mouth as needed.     No current facility-administered medications for this visit.    History   Social History  . Marital Status: Married    Spouse Name: N/A    Number of Children: N/A  . Years of Education: N/A   Occupational History  . Not on file.   Social History Main Topics  . Smoking status: Current Every Day Smoker -- 1.00 packs/day    Types: Cigarettes  . Smokeless tobacco: Never Used  . Alcohol Use: No  . Drug Use: No  . Sexual Activity: Not on file   Other Topics Concern  . Not on file   Social History Narrative    History reviewed. No pertinent family history.  Past Medical History  Diagnosis Date  . Ejection fraction     EF 60-65%, 2009  //   EF 60-65%, no wall motion abnormalities, echo, Jun 23, 2011  . Sinus tachycardia     Mild persistent sinus tachycardia 2009,   . Tobacco abuse   . Palpitations     February, 2013  . Sinus infection     February, 2013  . Nail patellar syndrome     Abnormal nails of the hands usually the thumb, absent or hypertrophic patella,  abnormal elbows,  autosomal dominant, must be very careful with renal function  . Abnormal EKG     Old diffuse ST scooping    History reviewed. No pertinent past surgical history.  Patient Active Problem List   Diagnosis Date Noted  . Vertigo 12/23/2013  . Dyspnea on exertion 12/23/2013  . Ejection fraction   . Nail patellar syndrome   . Abnormal EKG   . Palpitations   . Sinus infection   . Sinus tachycardia   . Tobacco abuse     ROS  Patient denies fever, chills, headache, sweats, rash, change in vision, change in hearing, chest pain, cough, nausea or vomiting, urinary symptoms. All other systems are reviewed and are negative.  PHYSICAL EXAM Patient is oriented to person time and place. Affect is normal. She's here with her husband. Head is atraumatic. Sclera and conjunctiva are normal. There is no jugulovenous distention. She is overweight. Lungs are clear. Respiratory effort is nonlabored. Cardiac exam reveals S1 and S2. Abdomen is soft. There is no peripheral edema.  Filed Vitals:   12/31/13 0856  BP: 164/87  Pulse: 95  Height: 4\' 10"  (1.473 m)  Weight: 150 lb 6.4 oz (68.221 kg)  SpO2: 96%     ASSESSMENT & PLAN

## 2013-12-31 NOTE — Assessment & Plan Note (Signed)
She has old mild persistent resting sinus tachycardia. This is stable. She will adjust her beta blockers accordingly.

## 2013-12-31 NOTE — Assessment & Plan Note (Signed)
Vertigo is playing a role in meclizine helps. No change in therapy.

## 2013-12-31 NOTE — Assessment & Plan Note (Signed)
Her recent dyspnea on exertion is related to tachycardia that she feels when she is stressed. I decided at this point not to do a Holter and not to do an echo. I will see her back in 8 weeks for reassessment.

## 2014-02-09 ENCOUNTER — Encounter (HOSPITAL_COMMUNITY): Payer: Self-pay | Admitting: Emergency Medicine

## 2014-02-09 ENCOUNTER — Emergency Department (HOSPITAL_COMMUNITY)
Admission: EM | Admit: 2014-02-09 | Discharge: 2014-02-09 | Disposition: A | Discharge status: Home / Self Care | Payer: Medicare Other | Attending: Emergency Medicine | Admitting: Emergency Medicine

## 2014-02-09 ENCOUNTER — Emergency Department (HOSPITAL_COMMUNITY): Payer: Medicare Other

## 2014-02-09 DIAGNOSIS — E669 Obesity, unspecified: Secondary | ICD-10-CM | POA: Insufficient documentation

## 2014-02-09 DIAGNOSIS — Z792 Long term (current) use of antibiotics: Secondary | ICD-10-CM | POA: Insufficient documentation

## 2014-02-09 DIAGNOSIS — Z720 Tobacco use: Secondary | ICD-10-CM | POA: Insufficient documentation

## 2014-02-09 DIAGNOSIS — Z79899 Other long term (current) drug therapy: Secondary | ICD-10-CM | POA: Insufficient documentation

## 2014-02-09 DIAGNOSIS — Z8679 Personal history of other diseases of the circulatory system: Secondary | ICD-10-CM | POA: Insufficient documentation

## 2014-02-09 DIAGNOSIS — R110 Nausea: Secondary | ICD-10-CM | POA: Insufficient documentation

## 2014-02-09 DIAGNOSIS — R61 Generalized hyperhidrosis: Secondary | ICD-10-CM | POA: Diagnosis not present

## 2014-02-09 DIAGNOSIS — Q872 Congenital malformation syndromes predominantly involving limbs: Secondary | ICD-10-CM | POA: Insufficient documentation

## 2014-02-09 DIAGNOSIS — Z8709 Personal history of other diseases of the respiratory system: Secondary | ICD-10-CM | POA: Insufficient documentation

## 2014-02-09 DIAGNOSIS — R079 Chest pain, unspecified: Secondary | ICD-10-CM

## 2014-02-09 LAB — CBC WITH DIFFERENTIAL/PLATELET
Basophils Absolute: 0 10*3/uL (ref 0.0–0.1)
Basophils Relative: 0 % (ref 0–1)
EOS PCT: 4 % (ref 0–5)
Eosinophils Absolute: 0.5 10*3/uL (ref 0.0–0.7)
HCT: 47 % — ABNORMAL HIGH (ref 36.0–46.0)
Hemoglobin: 15.6 g/dL — ABNORMAL HIGH (ref 12.0–15.0)
LYMPHS ABS: 2.3 10*3/uL (ref 0.7–4.0)
Lymphocytes Relative: 18 % (ref 12–46)
MCH: 29.8 pg (ref 26.0–34.0)
MCHC: 33.2 g/dL (ref 30.0–36.0)
MCV: 89.7 fL (ref 78.0–100.0)
MONOS PCT: 3 % (ref 3–12)
Monocytes Absolute: 0.4 10*3/uL (ref 0.1–1.0)
NEUTROS ABS: 9.8 10*3/uL — AB (ref 1.7–7.7)
Neutrophils Relative %: 75 % (ref 43–77)
Platelets: 349 10*3/uL (ref 150–400)
RBC: 5.24 MIL/uL — AB (ref 3.87–5.11)
RDW: 13.5 % (ref 11.5–15.5)
WBC: 13.1 10*3/uL — ABNORMAL HIGH (ref 4.0–10.5)

## 2014-02-09 LAB — BASIC METABOLIC PANEL
Anion gap: 10 (ref 5–15)
BUN: 7 mg/dL (ref 6–23)
CO2: 27 mmol/L (ref 19–32)
CREATININE: 0.55 mg/dL (ref 0.50–1.10)
Calcium: 9.7 mg/dL (ref 8.4–10.5)
Chloride: 102 mEq/L (ref 96–112)
GFR calc Af Amer: >90 mL/min (ref >90)
GFR calc non Af Amer: >90 mL/min (ref >90)
Glucose, Bld: 126 mg/dL — ABNORMAL HIGH (ref 70–99)
POTASSIUM: 2.4 mmol/L — AB (ref 3.5–5.1)
SODIUM: 139 mmol/L (ref 135–145)

## 2014-02-09 LAB — TROPONIN I: Troponin I: <0.03 ng/mL (ref <0.031)

## 2014-02-09 MED ORDER — POTASSIUM CHLORIDE CRYS ER 20 MEQ PO TBCR: 20.0000 meq | EXTENDED_RELEASE_TABLET | Freq: Two times a day (BID) | ORAL | Status: DC

## 2014-02-09 MED ORDER — FAMOTIDINE 20 MG PO TABS: 20.0000 mg | ORAL_TABLET | Freq: Two times a day (BID) | ORAL | Status: DC

## 2014-02-09 MED ORDER — ONDANSETRON HCL 4 MG/2ML IJ SOLN
4.0000 mg | Freq: Once | INTRAMUSCULAR | Status: AC
  Administered 2014-02-09: 4 mg via INTRAVENOUS
  Filled 2014-02-09: qty 2

## 2014-02-09 MED ORDER — POTASSIUM CHLORIDE CRYS ER 20 MEQ PO TBCR
40.0000 meq | EXTENDED_RELEASE_TABLET | Freq: Once | ORAL | Status: AC
  Administered 2014-02-09: 40 meq via ORAL
  Filled 2014-02-09: qty 2

## 2014-02-09 MED ORDER — PANTOPRAZOLE SODIUM 40 MG IV SOLR
40.0000 mg | Freq: Once | INTRAVENOUS | Status: AC
  Administered 2014-02-09: 40 mg via INTRAVENOUS
  Filled 2014-02-09: qty 40

## 2014-02-09 NOTE — ED Notes (Signed)
Pt reports recent treatment for bronchitis, and hx of anxiety, pt states she has been expierencing" warm water" come up the back of her throat

## 2014-02-09 NOTE — ED Notes (Signed)
CRITICAL VALUE ALERT  Critical value received: K+:2.4  Date of notification:  02/09/2014  Time of notification: 11:17  Critical value read back:Yes.    Nurse who received alert:  Ernestene Mention  Responding MD:  Dr.Cook  Time MD responded:  11:17

## 2014-02-09 NOTE — ED Notes (Signed)
Pt states that she is feeling better and could go home. Pt denies nausea at this time states she still has some burning in her stomach. [place] notified.

## 2014-02-09 NOTE — ED Provider Notes (Signed)
CSN: [id number]     Arrival date & time 02/09/14  6861 History  This chart was scribed for Nat Christen, MD by Zola Button, ED Scribe. This patient was seen in room APA01/APA01 and the patient's care was started at 10:06 AM.       Chief Complaint  Patient presents with  . Chest Pain   The history is provided by the patient. No language interpreter was used.   HPI Comments: Kathryn Meyers is a 69 y.o. female with a hx of anxiety and sinus tachycardia who presents to the Emergency Department complaining of gradual onset, gradually worsening burning chest pain that was felt this morning around 8:00 AM after waking up. Patient states the pain feels like indigestion and she also states that it feels like "warm water" is coming up to her throat from her stomach. She reports having associated nausea last night and today. Patient also reports having diaphoresis, but she attributes this to anxiety. She took a Zofran last night with relief to her nausea. This morning, she took antacids and ASA but without relief to her chest pain. Patient denies SOB. She has been on a course of Augmentin for the past 10 days for bronchitis, and she still currently has URI symptoms, including cough. Patient notes that she does get indigestion from time to time.  Patient is on metoprolol for her tachycardia, which she states she has had for life.  PCP: Dr. Scotty Court in [place] Cardiologist: Dr. Ron Parker  Past Medical History  Diagnosis Date  . Ejection fraction     EF 60-65%, 2009  //   EF 60-65%, no wall motion abnormalities, echo, Jun 23, 2011  . Sinus tachycardia     Mild persistent sinus tachycardia 2009,   . Tobacco abuse   . Palpitations     February, 2013  . Sinus infection     February, 2013  . Nail patellar syndrome     Abnormal nails of the hands usually the thumb, absent or hypertrophic patella,  abnormal elbows, autosomal dominant, must be very careful with renal function  . Abnormal EKG     Old diffuse ST  scooping   History reviewed. No pertinent past surgical history. No family history on file. History  Substance Use Topics  . Smoking status: Current Every Day Smoker -- 1.00 packs/day    Types: Cigarettes  . Smokeless tobacco: Never Used  . Alcohol Use: No   OB History    No data available     Review of Systems  Constitutional: Positive for diaphoresis.  Respiratory: Positive for cough. Negative for shortness of breath.   Cardiovascular: Positive for chest pain.  Gastrointestinal: Positive for nausea.  All other systems reviewed and are negative.     Allergies  Nsaids  Home Medications   Prior to Admission medications   Medication Sig Start Date End Date Taking? Authorizing Provider  Acetaminophen (PAIN RELIEF PO) Take 500 mg by mouth every 6 (six) hours as needed (pain and fever).    Yes Historical Provider, MD  albuterol (PROVENTIL HFA;VENTOLIN HFA) 108 (90 BASE) MCG/ACT inhaler Inhale 2 puffs into the lungs 2 (two) times daily.   Yes Historical Provider, MD  amoxicillin-clavulanate (AUGMENTIN) 875-125 MG per tablet Take 1 tablet by mouth 2 (two) times daily.   Yes Historical Provider, MD  benzonatate (TESSALON) 200 MG capsule Take 200 mg by mouth 3 (three) times daily as needed. 11/06/13  Yes Historical Provider, MD  HYDROcodone-homatropine (HYCODAN) 5-1.5 MG/5ML syrup Take 5  mLs by mouth every 8 (eight) hours as needed for cough.   Yes Historical Provider, MD  metoprolol (LOPRESSOR) 50 MG tablet Take 1 tablet (50 mg total) by mouth 3 (three) times daily. Patient taking differently: Take 50 mg by mouth 2 (two) times daily. May take a third dose if her heart acts up 12/23/13  Yes Carlena Bjornstad, MD  MUCINEX 600 MG 12 hr tablet Take 1 tablet by mouth 2 (two) times daily. 11/06/13  Yes Historical Provider, MD  promethazine-dextromethorphan (PROMETHAZINE-DM) 6.25-15 MG/5ML syrup Take 5 mLs by mouth every 6 (six) hours as needed for cough.   Yes Historical Provider, MD   famotidine (PEPCID) 20 MG tablet Take 1 tablet (20 mg total) by mouth 2 (two) times daily. 02/09/14   Nat Christen, MD  potassium chloride SA (K-DUR,KLOR-CON) 20 MEQ tablet Take 1 tablet (20 mEq total) by mouth 2 (two) times daily. 02/09/14   Nat Christen, MD   BP 128/72 mmHg  Pulse 76  Temp(Src) 98.2 F (36.8 C)  Resp 15  Ht 4\' 10"  (1.473 m)  Wt 148 lb (67.132 kg)  BMI 30.94 kg/m2  SpO2 93% Physical Exam  Constitutional: She is oriented to person, place, and time. She appears well-developed and well-nourished.  Obese.  HENT:  Head: Normocephalic and atraumatic.  Eyes: Conjunctivae and EOM are normal. Pupils are equal, round, and reactive to light.  Neck: Normal range of motion. Neck supple.  Cardiovascular: Normal rate and regular rhythm.   Pulmonary/Chest: Effort normal and breath sounds normal.  Abdominal: Soft. Bowel sounds are normal.  Musculoskeletal: Normal range of motion.  Neurological: She is alert and oriented to person, place, and time.  Skin: Skin is warm and dry.  Psychiatric: She has a normal mood and affect. Her behavior is normal.  Nursing note and vitals reviewed.   ED Course  Procedures  DIAGNOSTIC STUDIES: Oxygen Saturation is 92% on room air, adequate by my interpretation.    COORDINATION OF CARE: 10:13 AM-Discussed treatment plan which includes EKG, CXR and blood work with pt at bedside and pt agreed to plan.   Labs Review Labs Reviewed  BASIC METABOLIC PANEL - Abnormal; Notable for the following:    Potassium 2.4 (*)    Glucose, Bld 126 (*)    All other components within normal limits  CBC WITH DIFFERENTIAL - Abnormal; Notable for the following:    WBC 13.1 (*)    RBC 5.24 (*)    Hemoglobin 15.6 (*)    HCT 47.0 (*)    Neutro Abs 9.8 (*)    All other components within normal limits  TROPONIN I    Imaging Review Dg Chest Port 1 View  02/09/2014   CLINICAL DATA:  Chest pain and cough beginning last night  EXAM: PORTABLE CHEST - 1 VIEW   COMPARISON:  Portable exam 1102 hr compared to 03/10/2011  FINDINGS: Upper normal heart size.  Normal mediastinal contours and pulmonary vascularity.  Lungs clear.  No pleural effusion or pneumothorax.  Bones unremarkable.  IMPRESSION: No acute abnormalities.   Electronically Signed   By: Lavonia Dana M.D.   On: 02/09/2014 11:27     EKG Interpretation   Date/Time:  Sunday February 09 2014 10:02:02 EST Ventricular Rate:  96 PR Interval:  79 QRS Duration: 89 QT Interval:  337 QTC Calculation: 426 R Axis:   64 Text Interpretation:  Sinus rhythm Short PR interval Repol abnrm, severe  global ischemia (LM/MVD) Confirmed by Lacinda Axon  MD, Dalyah Pla ([id number])  on 02/09/2014  10:21:59 AM      MDM   Final diagnoses:  Chest pain    History and physical more consistent with dyspepsia and reflux. EKG, chest x-ray, troponin all negative. She feels better after IV Zofran, IV Protonix. Potassium replacement ordered. Discussed findings with patient and her husband. She will follow-up with her primary care doctor. Discharge medication potassium.  I personally performed the services described in this documentation, which was scribed in my presence. The recorded information has been reviewed and is accurate.    Nat Christen, MD 02/09/14 1539

## 2014-02-09 NOTE — Discharge Instructions (Signed)
Your potassium is low. This will need to be rechecked in approximately 1 week. Prescription for potassium and stomach medicine

## 2014-02-09 NOTE — ED Notes (Signed)
Pt c/o burning cp radiating throughout chest and shoulders since last night. Some tingling in bilateral arms and nausea. Pt states no relief from 325mg  asa and otc antacids. nad noted.

## 2014-03-05 ENCOUNTER — Ambulatory Visit: Payer: 59 | Admitting: Cardiology

## 2014-03-12 ENCOUNTER — Ambulatory Visit (INDEPENDENT_AMBULATORY_CARE_PROVIDER_SITE_OTHER): Payer: Medicare Other | Admitting: Cardiology

## 2014-03-12 ENCOUNTER — Encounter: Payer: Self-pay | Admitting: *Deleted

## 2014-03-12 ENCOUNTER — Encounter: Payer: Self-pay | Admitting: Cardiology

## 2014-03-12 VITALS — BP 156/92 | HR 103 | Ht <= 58 in | Wt 147.0 lb

## 2014-03-12 DIAGNOSIS — Q872 Congenital malformation syndromes predominantly involving limbs: Secondary | ICD-10-CM

## 2014-03-12 DIAGNOSIS — I471 Supraventricular tachycardia: Secondary | ICD-10-CM

## 2014-03-12 DIAGNOSIS — E876 Hypokalemia: Secondary | ICD-10-CM

## 2014-03-12 DIAGNOSIS — R000 Tachycardia, unspecified: Secondary | ICD-10-CM

## 2014-03-12 NOTE — Progress Notes (Signed)
Cardiology Office Note   Date:  03/12/2014   ID:  Kathryn Meyers, DOB December 28, 1945, MRN [id number]  PCP:  Deloria Lair, MD  Cardiologist:  Dola Argyle, MD   Chief Complaint  Patient presents with  . Appointment    Follow-up sinus tachycardia      History of Present Illness: Kathryn Meyers is a 69 y.o. female who presents today to follow-up sinus tachycardia. I saw her last December 31, 2013. She was stable then. Since then she's had an emergency room visit on February 09, 2014. It appears that she had some significant reflux symptoms. Also she'd had some diarrhea and her potassium was low. She has been treated with potassium and she tells me that follow-up labs with her primary physician have shown stabilization of her potassium. Potassium was now been stopped. She is not feeling any marked palpitations.     Past Medical History  Diagnosis Date  . Ejection fraction     EF 60-65%, 2009  //   EF 60-65%, no wall motion abnormalities, echo, Jun 23, 2011  . Sinus tachycardia     Mild persistent sinus tachycardia 2009,   . Tobacco abuse   . Palpitations     February, 2013  . Sinus infection     February, 2013  . Nail patellar syndrome     Abnormal nails of the hands usually the thumb, absent or hypertrophic patella,  abnormal elbows, autosomal dominant, must be very careful with renal function  . Abnormal EKG     Old diffuse ST scooping    History reviewed. No pertinent past surgical history.  Patient Active Problem List   Diagnosis Date Noted  . Vertigo 12/23/2013  . Dyspnea on exertion 12/23/2013  . Ejection fraction   . Nail patellar syndrome   . Abnormal EKG   . Palpitations   . Sinus infection   . Sinus tachycardia   . Tobacco abuse       Current Outpatient Prescriptions  Medication Sig Dispense Refill  . Acetaminophen (PAIN RELIEF PO) Take 500 mg by mouth every 6 (six) hours as needed (pain and fever).     Marland Kitchen albuterol (PROVENTIL HFA;VENTOLIN HFA) 108 (90  BASE) MCG/ACT inhaler Inhale 2 puffs into the lungs 2 (two) times daily.    . benzonatate (TESSALON) 200 MG capsule Take 200 mg by mouth 3 (three) times daily as needed.  0  . famotidine (PEPCID) 20 MG tablet Take 1 tablet (20 mg total) by mouth 2 (two) times daily. 30 tablet 0  . HYDROcodone-homatropine (HYCODAN) 5-1.5 MG/5ML syrup Take 5 mLs by mouth every 8 (eight) hours as needed for cough.    . metoprolol (LOPRESSOR) 50 MG tablet Take 1 tablet (50 mg total) by mouth 3 (three) times daily. (Patient taking differently: Take 50 mg by mouth 2 (two) times daily. May take a third dose if her heart acts up) 270 tablet 3  . MUCINEX 600 MG 12 hr tablet Take 1 tablet by mouth 2 (two) times daily.  2  . potassium chloride SA (K-DUR,KLOR-CON) 20 MEQ tablet Take 1 tablet (20 mEq total) by mouth 2 (two) times daily. 20 tablet 0  . promethazine-dextromethorphan (PROMETHAZINE-DM) 6.25-15 MG/5ML syrup Take 5 mLs by mouth every 6 (six) hours as needed for cough.     No current facility-administered medications for this visit.    Allergies:   Nsaids    Social History:  The patient  reports that she has been smoking Cigarettes.  She  has been smoking about 1.00 pack per day. She has never used smokeless tobacco. She reports that she does not drink alcohol or use illicit drugs.   Family History:  There is no significant family history of coronary disease or significant arrhythmias.   ROS:  Please see the history of present illness.    Patient denies fever, chills, headache, sweats, rash, change in vision, change in hearing, chest pain, cough, nausea or vomiting, urinary symptoms. All other systems are reviewed and are negative.    PHYSICAL EXAM: VS:  BP 156/92 mmHg  Pulse 103  Ht 4\' 10"  (1.473 m)  Wt 147 lb (66.679 kg)  BMI 30.73 kg/m2  SpO2 95% , Patient is stable. She is here with her husband. Head is atraumatic. Sclera and conjunctiva are normal. There is no jugular venous distention. Lungs are  clear. Respiratory effort is nonlabored. Cardiac exam reveals S1 and S2. Abdomen is soft. There is no peripheral edema. There are no musculoskeletal form of these. There are no skin rashes. Neurologic is grossly intact.  EKG:   No EKG is done today.   Recent Labs: 02/09/2014: BUN 7; Creatinine 0.55; Hemoglobin 15.6*; Platelets 349; Potassium 2.4*; Sodium 139    Lipid Panel No results found for: CHOL, TRIG, HDL, CHOLHDL, VLDL, LDLCALC, LDLDIRECT    Wt Readings from Last 3 Encounters:  03/12/14 147 lb (66.679 kg)  02/09/14 148 lb (67.132 kg)  12/31/13 150 lb 6.4 oz (68.221 kg)      Current medicines are reviewed. The patient understands her medicines.       ASSESSMENT AND PLAN:

## 2014-03-12 NOTE — Assessment & Plan Note (Signed)
Recent hypokalemia was due to diarrhea. This has been treated and is resolved.

## 2014-03-12 NOTE — Assessment & Plan Note (Signed)
She has mild persistent resting sinus tachycardia. She responds appropriately to beta blockers. No further workup.

## 2014-03-12 NOTE — Patient Instructions (Addendum)

## 2014-03-12 NOTE — Assessment & Plan Note (Signed)
This is a congenital abnormality. It is recommended to be careful with the patient's renal function. Her BUN and creatinine were normal when checked in the hospital in January, 2016.

## 2014-04-02 ENCOUNTER — Encounter (INDEPENDENT_AMBULATORY_CARE_PROVIDER_SITE_OTHER): Payer: Self-pay | Admitting: *Deleted

## 2014-04-04 ENCOUNTER — Other Ambulatory Visit (HOSPITAL_COMMUNITY): Payer: Self-pay | Admitting: Internal Medicine

## 2014-04-04 DIAGNOSIS — Z801 Family history of malignant neoplasm of trachea, bronchus and lung: Secondary | ICD-10-CM

## 2014-04-04 DIAGNOSIS — R0602 Shortness of breath: Secondary | ICD-10-CM

## 2014-04-09 ENCOUNTER — Ambulatory Visit (HOSPITAL_COMMUNITY)
Admission: RE | Admit: 2014-04-09 | Discharge: 2014-04-09 | Disposition: A | Discharge status: Home / Self Care | Payer: Medicare Other | Source: Ambulatory Visit | Attending: Internal Medicine | Admitting: Internal Medicine

## 2014-04-09 ENCOUNTER — Encounter (HOSPITAL_COMMUNITY): Payer: Self-pay

## 2014-04-09 DIAGNOSIS — Z801 Family history of malignant neoplasm of trachea, bronchus and lung: Secondary | ICD-10-CM | POA: Diagnosis not present

## 2014-04-09 DIAGNOSIS — F17200 Nicotine dependence, unspecified, uncomplicated: Secondary | ICD-10-CM | POA: Insufficient documentation

## 2014-04-09 DIAGNOSIS — R0602 Shortness of breath: Secondary | ICD-10-CM | POA: Diagnosis present

## 2014-04-09 DIAGNOSIS — I2510 Atherosclerotic heart disease of native coronary artery without angina pectoris: Secondary | ICD-10-CM | POA: Insufficient documentation

## 2014-04-29 ENCOUNTER — Ambulatory Visit (INDEPENDENT_AMBULATORY_CARE_PROVIDER_SITE_OTHER): Payer: Medicare Other | Admitting: Internal Medicine

## 2014-05-01 ENCOUNTER — Other Ambulatory Visit (INDEPENDENT_AMBULATORY_CARE_PROVIDER_SITE_OTHER): Payer: Self-pay | Admitting: *Deleted

## 2014-05-01 ENCOUNTER — Telehealth (INDEPENDENT_AMBULATORY_CARE_PROVIDER_SITE_OTHER): Payer: Self-pay | Admitting: *Deleted

## 2014-05-01 ENCOUNTER — Ambulatory Visit (INDEPENDENT_AMBULATORY_CARE_PROVIDER_SITE_OTHER): Payer: Medicare Other | Admitting: Internal Medicine

## 2014-05-01 ENCOUNTER — Encounter (INDEPENDENT_AMBULATORY_CARE_PROVIDER_SITE_OTHER): Payer: Self-pay | Admitting: Internal Medicine

## 2014-05-01 VITALS — BP 140/72 | HR 76 | Temp 97.5°F | Ht <= 58 in | Wt 146.6 lb

## 2014-05-01 DIAGNOSIS — R197 Diarrhea, unspecified: Secondary | ICD-10-CM | POA: Diagnosis not present

## 2014-05-01 DIAGNOSIS — R195 Other fecal abnormalities: Secondary | ICD-10-CM

## 2014-05-01 DIAGNOSIS — A0472 Enterocolitis due to Clostridium difficile, not specified as recurrent: Secondary | ICD-10-CM

## 2014-05-01 NOTE — Patient Instructions (Addendum)
GI pathogen Imodium. Probiotic Colonoscopy. The risks and benefits such as perforation, bleeding, and infection were reviewed with the patient and is agreeable.

## 2014-05-01 NOTE — Progress Notes (Signed)
Subjective:    Patient ID: Kathryn Meyers, female    DOB: 10/17/1945, 69 y.o.   MRN: [id number]  HPI Referred rto our office by Dr. Janae Sauce for chronic diarrhea. Every since she was treated with H. Pylori last summer she has had diarrhea.  She tells me she has chronic diarrhea but this is worse. She was treated for H. Pylori. H. Pylori was negative, however. She was having epigastric pain and nausea. When she vomited it smelled like stool.  She says since last year she has not had a formed stool. Her stools are soft. Sometimes she has some incontience (small amt). She is having 3 stools a day.  Her last colonoscopy was 8 yrs ago by Dr. Anthony Sar and was normal.  Her appetite is okay. She says she has lost about 3 pounds over the past year. If she takes NSAIDS she has rectal bleeding. She does have a hemorrhoid.  Rarely has acid reflux. Treated in January for Bronchitis with Augmentin. In July 2015 stools studies were negative. Review of Systems  Married. Hairdresser. Four children in good health. Three have Nail Patellar syndrome Amazing Grace 1 a day.   Vitamin B12 one a month injection.   Past Medical History  Diagnosis Date  . Ejection fraction     EF 60-65%, 2009  //   EF 60-65%, no wall motion abnormalities, echo, Jun 23, 2011  . Sinus tachycardia     Mild persistent sinus tachycardia 2009,   . Tobacco abuse   . Palpitations     February, 2013  . Sinus infection     February, 2013  . Nail patellar syndrome     Abnormal nails of the hands usually the thumb, absent or hypertrophic patella,  abnormal elbows, autosomal dominant, must be very careful with renal function  . Abnormal EKG     Old diffuse ST scooping    Past Surgical History  Procedure Laterality Date  . Growth in ear removed      1994  . Tonsillectomy      age 12  . Cholecystectomy  1982    cholecystitis  . Cesarean section      x 4  . Two knee surgeries      Allergies  Allergen Reactions  . Nsaids      Current Outpatient Prescriptions on File Prior to Visit  Medication Sig Dispense Refill  . Acetaminophen (PAIN RELIEF PO) Take 500 mg by mouth every 6 (six) hours as needed (pain and fever).     Marland Kitchen albuterol (PROVENTIL HFA;VENTOLIN HFA) 108 (90 BASE) MCG/ACT inhaler Inhale 2 puffs into the lungs 2 (two) times daily.    . metoprolol (LOPRESSOR) 50 MG tablet Take 1 tablet (50 mg total) by mouth 3 (three) times daily. (Patient taking differently: Take 50 mg by mouth 2 (two) times daily. May take a third dose if her heart acts up) 270 tablet 3   No current facility-administered medications on file prior to visit.       Objective:   Physical Exam Blood pressure 140/72, pulse 76, temperature 97.5 F (36.4 C), height 4\' 10"  (1.473 m), weight 146 lb 9.6 oz (66.497 kg). Alert and oriented. Skin warm and dry. Oral mucosa is moist.   . Sclera anicteric, conjunctivae is pink. Thyroid not enlarged. No cervical lymphadenopathy. Lungs clear. Heart regular rate and rhythm.  Abdomen is soft. Bowel sounds are positive. Liver 1-2 fingerbreaths below costal margin. . No abdominal masses felt. No tenderness.  No  edema to lower extremities.      Developer: [id number], Exp 9-17 Card: Lot 02-14, Exp 07.18     Assessment & Plan:  Guaiac positive stool. Colonic neoplasm needs to be ruled. Colonoscopy. The risks and benefits such as perforation, bleeding, and infection were reviewed with the patient and is agreeable. Chronic diarrhea Get a Probiotic Imodium once a day. Stool studies.

## 2014-05-01 NOTE — Telephone Encounter (Signed)
Patient needs trilyte 

## 2014-05-02 ENCOUNTER — Other Ambulatory Visit (INDEPENDENT_AMBULATORY_CARE_PROVIDER_SITE_OTHER): Payer: Self-pay | Admitting: Internal Medicine

## 2014-05-02 LAB — CBC WITH DIFFERENTIAL/PLATELET
BASOS ABS: 0.1 10*3/uL (ref 0.0–0.1)
Basophils Relative: 1 % (ref 0–1)
Eosinophils Absolute: 0.3 10*3/uL (ref 0.0–0.7)
Eosinophils Relative: 3 % (ref 0–5)
HCT: 45.8 % (ref 36.0–46.0)
Hemoglobin: 15.4 g/dL — ABNORMAL HIGH (ref 12.0–15.0)
LYMPHS PCT: 33 % (ref 12–46)
Lymphs Abs: 3.7 10*3/uL (ref 0.7–4.0)
MCH: 30 pg (ref 26.0–34.0)
MCHC: 33.6 g/dL (ref 30.0–36.0)
MCV: 89.3 fL (ref 78.0–100.0)
MPV: 10 fL (ref 8.6–12.4)
Monocytes Absolute: 0.7 10*3/uL (ref 0.1–1.0)
Monocytes Relative: 6 % (ref 3–12)
Neutro Abs: 6.4 10*3/uL (ref 1.7–7.7)
Neutrophils Relative %: 57 % (ref 43–77)
PLATELETS: 363 10*3/uL (ref 150–400)
RBC: 5.13 MIL/uL — ABNORMAL HIGH (ref 3.87–5.11)
RDW: 13.6 % (ref 11.5–15.5)
WBC: 11.2 10*3/uL — ABNORMAL HIGH (ref 4.0–10.5)

## 2014-05-05 LAB — GASTROINTESTINAL PATHOGEN PANEL PCR
C. difficile Tox A/B, PCR: POSITIVE — CR
CRYPTOSPORIDIUM, PCR: NEGATIVE
Campylobacter, PCR: NEGATIVE
E COLI (STEC) STX1/STX2, PCR: NEGATIVE
E COLI 0157, PCR: NEGATIVE
E coli (ETEC) LT/ST PCR: NEGATIVE
GIARDIA LAMBLIA, PCR: NEGATIVE
NOROVIRUS, PCR: NEGATIVE
Rotavirus A, PCR: NEGATIVE
Salmonella, PCR: NEGATIVE
Shigella, PCR: NEGATIVE

## 2014-05-06 ENCOUNTER — Telehealth: Payer: Self-pay | Admitting: Internal Medicine

## 2014-05-06 ENCOUNTER — Telehealth (INDEPENDENT_AMBULATORY_CARE_PROVIDER_SITE_OTHER): Payer: Self-pay | Admitting: Internal Medicine

## 2014-05-06 MED ORDER — METRONIDAZOLE 500 MG PO TABS: 500.0000 mg | ORAL_TABLET | Freq: Three times a day (TID) | ORAL | Status: DC

## 2014-05-06 MED ORDER — PEG 3350-KCL-NA BICARB-NACL 420 G PO SOLR: 4000.0000 mL | Freq: Once | ORAL | Status: DC

## 2014-05-06 NOTE — Telephone Encounter (Signed)
Rx has been sent to her pharmacy for Flagyl

## 2014-05-06 NOTE — Telephone Encounter (Signed)
Kathryn Meyers, postpone colonoscopy for at least 6 weeks. Patient has C-diff

## 2014-05-06 NOTE — Telephone Encounter (Signed)
Rx for Flagyl sent to her pharmacy 

## 2014-05-06 NOTE — Telephone Encounter (Signed)
TCS resch'd to 06/19/14, patient aware

## 2014-05-06 NOTE — Telephone Encounter (Signed)
Lab called after 10 pm last night to inform me pt has a positive Cdiff assay. No action taken.  This note being sent to Allen Norris and Dr. Laural Golden for their recommendations for management.

## 2014-05-08 ENCOUNTER — Encounter (INDEPENDENT_AMBULATORY_CARE_PROVIDER_SITE_OTHER): Payer: Self-pay

## 2014-05-27 ENCOUNTER — Telehealth (INDEPENDENT_AMBULATORY_CARE_PROVIDER_SITE_OTHER): Payer: Self-pay | Admitting: *Deleted

## 2014-05-27 NOTE — Telephone Encounter (Signed)
Would like to speak with Kathryn Meyers if could return the call to [phone]. This is very important.

## 2014-05-27 NOTE — Telephone Encounter (Signed)
Stools are better. She is having 3 stools a day. Stools are formed.  Had some redness to her feet and hands: I advised her to follow up with Dr. Scotty Court.

## 2014-06-19 ENCOUNTER — Encounter (HOSPITAL_COMMUNITY): Payer: Self-pay | Admitting: *Deleted

## 2014-06-19 ENCOUNTER — Encounter (HOSPITAL_COMMUNITY)
Admission: RE | Disposition: A | Discharge status: Home / Self Care | Payer: Self-pay | Source: Ambulatory Visit | Attending: Internal Medicine

## 2014-06-19 ENCOUNTER — Ambulatory Visit (HOSPITAL_COMMUNITY)
Admission: RE | Admit: 2014-06-19 | Discharge: 2014-06-19 | Disposition: A | Discharge status: Home / Self Care | Payer: Medicare Other | Source: Ambulatory Visit | Attending: Internal Medicine | Admitting: Internal Medicine

## 2014-06-19 DIAGNOSIS — D120 Benign neoplasm of cecum: Secondary | ICD-10-CM | POA: Insufficient documentation

## 2014-06-19 DIAGNOSIS — K5730 Diverticulosis of large intestine without perforation or abscess without bleeding: Secondary | ICD-10-CM | POA: Insufficient documentation

## 2014-06-19 DIAGNOSIS — D124 Benign neoplasm of descending colon: Secondary | ICD-10-CM | POA: Insufficient documentation

## 2014-06-19 DIAGNOSIS — D123 Benign neoplasm of transverse colon: Secondary | ICD-10-CM

## 2014-06-19 DIAGNOSIS — R195 Other fecal abnormalities: Secondary | ICD-10-CM

## 2014-06-19 DIAGNOSIS — F17210 Nicotine dependence, cigarettes, uncomplicated: Secondary | ICD-10-CM | POA: Insufficient documentation

## 2014-06-19 DIAGNOSIS — K648 Other hemorrhoids: Secondary | ICD-10-CM

## 2014-06-19 DIAGNOSIS — K644 Residual hemorrhoidal skin tags: Secondary | ICD-10-CM | POA: Insufficient documentation

## 2014-06-19 DIAGNOSIS — Z79899 Other long term (current) drug therapy: Secondary | ICD-10-CM | POA: Insufficient documentation

## 2014-06-19 DIAGNOSIS — D175 Benign lipomatous neoplasm of intra-abdominal organs: Secondary | ICD-10-CM

## 2014-06-19 DIAGNOSIS — R197 Diarrhea, unspecified: Secondary | ICD-10-CM | POA: Diagnosis not present

## 2014-06-19 HISTORY — PX: COLONOSCOPY: SHX5424

## 2014-06-19 SURGERY — COLONOSCOPY
Anesthesia: Moderate Sedation

## 2014-06-19 MED ORDER — STERILE WATER FOR IRRIGATION IR SOLN
Status: DC | PRN
  Administered 2014-06-19: 12:00:00

## 2014-06-19 MED ORDER — DICYCLOMINE HCL 10 MG PO CAPS: 10.0000 mg | ORAL_CAPSULE | Freq: Two times a day (BID) | ORAL | Status: DC

## 2014-06-19 MED ORDER — MIDAZOLAM HCL 5 MG/5ML IJ SOLN
INTRAMUSCULAR | Status: DC | PRN
  Administered 2014-06-19 (×2): 2 mg via INTRAVENOUS
  Administered 2014-06-19 (×2): 1 mg via INTRAVENOUS
  Administered 2014-06-19 (×3): 2 mg via INTRAVENOUS
  Administered 2014-06-19: 1 mg via INTRAVENOUS

## 2014-06-19 MED ORDER — MEPERIDINE HCL 50 MG/ML IJ SOLN
INTRAMUSCULAR | Status: AC
  Filled 2014-06-19: qty 1

## 2014-06-19 MED ORDER — SODIUM CHLORIDE 0.9 % IV SOLN
INTRAVENOUS | Status: DC
  Administered 2014-06-19: 11:00:00 via INTRAVENOUS

## 2014-06-19 MED ORDER — MIDAZOLAM HCL 5 MG/5ML IJ SOLN
INTRAMUSCULAR | Status: DC
Start: 2014-06-19 — End: 2014-06-19
  Filled 2014-06-19: qty 5

## 2014-06-19 MED ORDER — MIDAZOLAM HCL 5 MG/5ML IJ SOLN
INTRAMUSCULAR | Status: AC
  Filled 2014-06-19: qty 10

## 2014-06-19 MED ORDER — FENTANYL CITRATE (PF) 100 MCG/2ML IJ SOLN
INTRAMUSCULAR | Status: AC
  Filled 2014-06-19: qty 2

## 2014-06-19 MED ORDER — FENTANYL CITRATE (PF) 100 MCG/2ML IJ SOLN
INTRAMUSCULAR | Status: DC | PRN
  Administered 2014-06-19 (×4): 25 ug via INTRAVENOUS

## 2014-06-19 NOTE — H&P (Addendum)
Kathryn Meyers is an 69 y.o. female.   Chief Complaint: Patient is here for colonoscopy. HPI: Patient is 69 year old Caucasian female was at diarrhea for several years. She believes diarrhea started following gallbladder surgery. She has 3-4 stools on most days usually after meals. She was seen in the office last month for worsening diarrhea and GI pathogen panel was significant for C. difficile. She's been treated. She states acute symptoms have resolved. She was noted to have heme-positive stool. She has intermittent hematochezia which she believes is secondary to hemorrhoids. Family history is negative for CRC or IBD.  Past Medical History  Diagnosis Date  . Ejection fraction     EF 60-65%, 2009  //   EF 60-65%, no wall motion abnormalities, echo, Jun 23, 2011  . Sinus tachycardia     Mild persistent sinus tachycardia 2009,   . Tobacco abuse   . Palpitations     February, 2013  . Sinus infection     February, 2013  . Nail patellar syndrome     Abnormal nails of the hands usually the thumb, absent or hypertrophic patella,  abnormal elbows, autosomal dominant, must be very careful with renal function  . Abnormal EKG     Old diffuse ST scooping    Past Surgical History  Procedure Laterality Date  . Growth in ear removed      1994  . Tonsillectomy      age 61  . Cholecystectomy  1982    cholecystitis  . Cesarean section      x 4  . Two knee surgeries      History reviewed. No pertinent family history. Social History:  reports that she has been smoking Cigarettes.  She has been smoking about 1.00 pack per day. She has never used smokeless tobacco. She reports that she does not drink alcohol or use illicit drugs.  Allergies:  Allergies  Allergen Reactions  . Nsaids Other (See Comments)    Rectal bleeding.  . Cortisone Other (See Comments)    Dizzy/flush/rapid heart beat.  . Demerol [Meperidine] Nausea And Vomiting    Medications Prior to Admission  Medication Sig  Dispense Refill  . acetaminophen (TYLENOL) 500 MG tablet Take 500 mg by mouth every 6 (six) hours as needed for moderate pain.    . calcium carbonate (OS-CAL) 600 MG TABS tablet Take 600 mg by mouth 2 (two) times daily with a meal.    . cholecalciferol (VITAMIN D) 400 UNITS TABS tablet Take 5,000 Units by mouth.    . cyanocobalamin (,VITAMIN B-12,) 1000 MCG/ML injection Inject 1,000 mcg into the muscle every 30 (thirty) days.    . metoprolol (LOPRESSOR) 50 MG tablet Take 1 tablet (50 mg total) by mouth 3 (three) times daily. (Patient taking differently: Take 50 mg by mouth 2 (two) times daily. May take a third dose if her heart acts up) 270 tablet 3  . metroNIDAZOLE (FLAGYL) 500 MG tablet Take 1 tablet (500 mg total) by mouth 3 (three) times daily. 42 tablet 0  . OVER THE COUNTER MEDICATION Take 3 tablets by mouth 2 (two) times daily. Amazingrape    . polyethylene glycol-electrolytes (NULYTELY/GOLYTELY) 420 G solution Take 4,000 mLs by mouth once. 4000 mL 0  . albuterol (PROVENTIL HFA;VENTOLIN HFA) 108 (90 BASE) MCG/ACT inhaler Inhale 2 puffs into the lungs daily as needed for wheezing or shortness of breath.       No results found for this or any previous visit (from the past 48  hour(s)). No results found.  ROS  Blood pressure 167/90, pulse 115, temperature 97.7 F (36.5 C), temperature source Oral, resp. rate 18, height 4\' 10"  (1.473 m), weight 146 lb (66.225 kg), SpO2 96 %. Physical Exam  Constitutional: She appears well-developed and well-nourished.  HENT:  Mouth/Throat: Oropharynx is clear and moist.  Eyes: Conjunctivae are normal. No scleral icterus.  Neck: No thyromegaly present.  Cardiovascular: Normal rate, regular rhythm and normal heart sounds.   No murmur heard. Respiratory: Effort normal and breath sounds normal.  GI:  Abdomen is full but soft and nontender without organomegaly or masses.  Lymphadenopathy:    She has no cervical adenopathy.  Neurological: She is alert.   Skin: Skin is warm and dry.     Assessment/Plan Chronic diarrhea. Diagnostic colonoscopy.  REHMAN,NAJEEB U 06/19/2014, 11:29 AM

## 2014-06-19 NOTE — Op Note (Signed)
COLONOSCOPY PROCEDURE REPORT  PATIENT:  Kathryn Meyers  MR#:  [id number] Birthdate:  Sep 04, 1945, 69 y.o., female Endoscopist:  Dr. Rogene Houston, MD Referred By:  Dr. Audree Camel, MD Procedure Date: 06/19/2014  Procedure:   Colonoscopy with snare polypectomy and APC therapy  Indications:  Patient is 69 year old Caucasian female who presents with chronic diarrhea which she said for several years. She was noted to have heme-positive stool. Recent worsening of for diarrhea was due to C. difficile and now she is back to having 3-4 stools per day. Patient has undergone two colonoscopies previously elsewhere and these are reportedly normal. Last colonoscopy was 8 years ago. Family history is negative for CRC.  Informed Consent:  The procedure and risks were reviewed with the patient and informed consent was obtained.  Medications:  Fentanyl 100 g IV Versed 13 mg IV  Description of procedure:  After a digital rectal exam was performed, that colonoscope was advanced from the anus through the rectum and colon to the area of the cecum, ileocecal valve and appendiceal orifice. The cecum was deeply intubated. These structures were well-seen and photographed for the record. From the level of the cecum and ileocecal valve, the scope was slowly and cautiously withdrawn. The mucosal surfaces were carefully surveyed utilizing scope tip to flexion to facilitate fold flattening as needed. The scope was pulled down into the rectum where a thorough exam including retroflexion was performed.  Findings:   Prep satisfactory. Small cecal polyp was cold snared. Complex cecal polyp noted across from ileocecal valve. This polyp was 20 cm in maximal diameter. It was snared piecemeal and residual polyp ablated with APC. Both cecal polyps were submitted together. 10 polyps were snared from transverse colon. 3 of these were snared piecemeal. All of the fragments were submitted together. Largest polyp was 25 mm in  diameter. Two polyps were snared from descending colon measuring about 7 and 10 mm submitted together. Patient had at least 6 small polyps which were not removed on today's exam. Scattered diverticula at sigmoid colon. Normal rectal mucosa. Small hemorrhoids below the dentate line.   Therapeutic/Diagnostic Maneuvers Performed:  See above  Complications:  None  Cecal Withdrawal Time: 70 minutes  Impression:  Examination performed to cecum. Fourteen polyps were removed varying in size from 5 to 25 mm. Of these two were located at cecum,  Ten were at transverse colon and two at descending colon. Patient has at least 6 more small polyps that were not removed today. Mild sigmoid colon diverticulosis. Small external hemorrhoids   Recommendations:  Standard instructions given. Dicyclomine 10 mg by mouth 30 minutes before breakfast and lunch daily. I will contact patient with biopsy results and further recommendations.  Kamorah Nevils U  06/19/2014 1:08 PM  CC: Dr. Deloria Lair, MD & Dr. Rayne Du ref. provider found

## 2014-06-19 NOTE — Discharge Instructions (Signed)
No aspirin or NSAIDs for 2 weeks. Resume usual medications and diet. Dicyclomine 10 mg by mouth 30 minutes before breakfast and lunch daily. No driving for 24 hours. Physician will call with biopsy results.   Colon Polyps Polyps are lumps of extra tissue growing inside the body. Polyps can grow in the large intestine (colon). Most colon polyps are noncancerous (benign). However, some colon polyps can become cancerous over time. Polyps that are larger than a pea may be harmful. To be safe, caregivers remove and test all polyps. CAUSES  Polyps form when mutations in the genes cause your cells to grow and divide even though no more tissue is needed. RISK FACTORS There are a number of risk factors that can increase your chances of getting colon polyps. They include:  Being older than 50 years.  Family history of colon polyps or colon cancer.  Long-term colon diseases, such as colitis or Crohn disease.  Being overweight.  Smoking.  Being inactive.  Drinking too much alcohol. SYMPTOMS  Most small polyps do not cause symptoms. If symptoms are present, they may include:  Blood in the stool. The stool may look dark red or black.  Constipation or diarrhea that lasts longer than 1 week. DIAGNOSIS People often do not know they have polyps until their caregiver finds them during a regular checkup. Your caregiver can use 4 tests to check for polyps:  Digital rectal exam. The caregiver wears gloves and feels inside the rectum. This test would find polyps only in the rectum.  Barium enema. The caregiver puts a liquid called barium into your rectum before taking X-rays of your colon. Barium makes your colon look white. Polyps are dark, so they are easy to see in the X-ray pictures.  Sigmoidoscopy. A thin, flexible tube (sigmoidoscope) is placed into your rectum. The sigmoidoscope has a light and tiny camera in it. The caregiver uses the sigmoidoscope to look at the last third of your  colon.  Colonoscopy. This test is like sigmoidoscopy, but the caregiver looks at the entire colon. This is the most common method for finding and removing polyps. TREATMENT  Any polyps will be removed during a sigmoidoscopy or colonoscopy. The polyps are then tested for cancer. PREVENTION  To help lower your risk of getting more colon polyps:  Eat plenty of fruits and vegetables. Avoid eating fatty foods.  Do not smoke.  Avoid drinking alcohol.  Exercise every day.  Lose weight if recommended by your caregiver.  Eat plenty of calcium and folate. Foods that are rich in calcium include milk, cheese, and broccoli. Foods that are rich in folate include chickpeas, kidney beans, and spinach. HOME CARE INSTRUCTIONS Keep all follow-up appointments as directed by your caregiver. You may need periodic exams to check for polyps. SEEK MEDICAL CARE IF: You notice bleeding during a bowel movement. Document Released: 10/07/2003 Document Revised: 04/04/2011 Document Reviewed: 03/22/2011 [facility] Patient Information 2015 [place], [place]. This information is not intended to replace advice given to you by your health care provider. Make sure you discuss any questions you have with your health care provider. Colonoscopy, Care After These instructions give you information on caring for yourself after your procedure. Your doctor may also give you more specific instructions. Call your doctor if you have any problems or questions after your procedure. HOME CARE  Do not drive for 24 hours.  Do not sign important papers or use machinery for 24 hours.  You may shower.  You may go back to your usual  activities, but go slower for the first 24 hours.  Take rest breaks often during the first 24 hours.  Walk around or use warm packs on your belly (abdomen) if you have belly cramping or gas.  Drink enough fluids to keep your pee (urine) clear or pale yellow.  Resume your normal diet. Avoid heavy or fried  foods.  Avoid drinking alcohol for 24 hours or as told by your doctor.  Only take medicines as told by your doctor. If a tissue sample (biopsy) was taken during the procedure:   Do not take aspirin or blood thinners for 7 days, or as told by your doctor.  Do not drink alcohol for 7 days, or as told by your doctor.  Eat soft foods for the first 24 hours. GET HELP IF: You still have a small amount of blood in your poop (stool) 2-3 days after the procedure. GET HELP RIGHT AWAY IF:  You have more than a small amount of blood in your poop.  You see clumps of tissue (blood clots) in your poop.  Your belly is puffy (swollen).  You feel sick to your stomach (nauseous) or throw up (vomit).  You have a fever.  You have belly pain that gets worse and medicine does not help. MAKE SURE YOU:  Understand these instructions.  Will watch your condition.  Will get help right away if you are not doing well or get worse. Document Released: 02/12/2010 Document Revised: 01/15/2013 Document Reviewed: 09/17/2012 [facility] Patient Information 2015 [place], [place]. This information is not intended to replace advice given to you by your health care provider. Make sure you discuss any questions you have with your health care provider.

## 2014-06-20 ENCOUNTER — Encounter (HOSPITAL_COMMUNITY): Payer: Self-pay | Admitting: Internal Medicine

## 2014-06-24 ENCOUNTER — Encounter (INDEPENDENT_AMBULATORY_CARE_PROVIDER_SITE_OTHER): Payer: Self-pay | Admitting: *Deleted

## 2014-08-21 ENCOUNTER — Other Ambulatory Visit (INDEPENDENT_AMBULATORY_CARE_PROVIDER_SITE_OTHER): Payer: Self-pay | Admitting: Internal Medicine

## 2014-08-21 ENCOUNTER — Telehealth (INDEPENDENT_AMBULATORY_CARE_PROVIDER_SITE_OTHER): Payer: Self-pay | Admitting: *Deleted

## 2014-08-21 DIAGNOSIS — R197 Diarrhea, unspecified: Secondary | ICD-10-CM

## 2014-08-21 MED ORDER — METRONIDAZOLE 500 MG PO TABS: 500.0000 mg | ORAL_TABLET | Freq: Three times a day (TID) | ORAL | Status: DC

## 2014-08-21 NOTE — Telephone Encounter (Signed)
Rx for flagyl sent to her pharmacy.She says it smells like C-diff. No recent antibiotics. Will cover her and she will call me after taking to let me know how she is doing.

## 2014-08-21 NOTE — Telephone Encounter (Signed)
Kathryn Meyers would like for [name] to please give her a call at [phone]. Her symptoms have returned just like before she started the Flagyl.

## 2014-09-01 ENCOUNTER — Encounter (INDEPENDENT_AMBULATORY_CARE_PROVIDER_SITE_OTHER): Payer: Self-pay | Admitting: *Deleted

## 2014-09-05 ENCOUNTER — Telehealth (INDEPENDENT_AMBULATORY_CARE_PROVIDER_SITE_OTHER): Payer: Self-pay | Admitting: Internal Medicine

## 2014-09-05 ENCOUNTER — Other Ambulatory Visit (INDEPENDENT_AMBULATORY_CARE_PROVIDER_SITE_OTHER): Payer: Self-pay | Admitting: Internal Medicine

## 2014-09-05 ENCOUNTER — Telehealth (INDEPENDENT_AMBULATORY_CARE_PROVIDER_SITE_OTHER): Payer: Self-pay | Admitting: *Deleted

## 2014-09-05 DIAGNOSIS — A0472 Enterocolitis due to Clostridium difficile, not specified as recurrent: Secondary | ICD-10-CM

## 2014-09-05 NOTE — Telephone Encounter (Signed)
GI pathogen for next Wednesday

## 2014-09-05 NOTE — Telephone Encounter (Signed)
Patient left message stating you wanted her to call you when she finished her medicine, she asked that you call her at [phone]

## 2014-09-11 ENCOUNTER — Other Ambulatory Visit (INDEPENDENT_AMBULATORY_CARE_PROVIDER_SITE_OTHER): Payer: Self-pay | Admitting: *Deleted

## 2014-09-11 ENCOUNTER — Telehealth (INDEPENDENT_AMBULATORY_CARE_PROVIDER_SITE_OTHER): Payer: Self-pay | Admitting: *Deleted

## 2014-09-11 DIAGNOSIS — Z86010 Personal history of colonic polyps: Secondary | ICD-10-CM

## 2014-09-11 NOTE — Telephone Encounter (Signed)
Patient needs trilyte 

## 2014-09-11 NOTE — Telephone Encounter (Signed)
Referring MD/PCP: tapper   Procedure: tcs  Reason/Indication:  Hx polyps, fam hx colon ca  Has patient had this procedure before?  Yes, 05/2014  If so, when, by whom and where?    Is there a family history of colon cancer?  Yes, grandmother  Who?  What age when diagnosed?    Is patient diabetic?   no      Does patient have prosthetic heart valve?  no  Do you have a pacemaker?  no  Has patient ever had endocarditis? no  Has patient had joint replacement within last 12 months?  no  Does patient tend to be constipated or take laxatives? no  Is patient on Coumadin, Plavix and/or Aspirin? no  Medications: see epic  Allergies: see epic  Medication Adjustment:   Procedure date & time: 09/26/14 at 1020

## 2014-09-12 MED ORDER — PEG 3350-KCL-NA BICARB-NACL 420 G PO SOLR: 4000.0000 mL | Freq: Once | ORAL | Status: DC

## 2014-09-12 NOTE — Telephone Encounter (Signed)
agree

## 2014-09-16 LAB — GASTROINTESTINAL PATHOGEN PANEL PCR

## 2014-09-19 ENCOUNTER — Telehealth (INDEPENDENT_AMBULATORY_CARE_PROVIDER_SITE_OTHER): Payer: Self-pay | Admitting: *Deleted

## 2014-09-19 NOTE — Telephone Encounter (Signed)
No answer at home #

## 2014-09-19 NOTE — Telephone Encounter (Signed)
GI pathogen not done.

## 2014-09-19 NOTE — Telephone Encounter (Signed)
addressed

## 2014-09-19 NOTE — Telephone Encounter (Signed)
Patient left message, wants you to call her at [phone] -- wants to know results of test you ordered

## 2014-09-24 ENCOUNTER — Encounter: Payer: Self-pay | Admitting: Cardiology

## 2014-09-24 ENCOUNTER — Ambulatory Visit (INDEPENDENT_AMBULATORY_CARE_PROVIDER_SITE_OTHER): Payer: Medicare Other | Admitting: Cardiology

## 2014-09-24 VITALS — BP 131/80 | HR 88 | Ht <= 58 in | Wt 145.1 lb

## 2014-09-24 DIAGNOSIS — Q872 Congenital malformation syndromes predominantly involving limbs: Secondary | ICD-10-CM

## 2014-09-24 DIAGNOSIS — R918 Other nonspecific abnormal finding of lung field: Secondary | ICD-10-CM

## 2014-09-24 DIAGNOSIS — R000 Tachycardia, unspecified: Secondary | ICD-10-CM

## 2014-09-24 DIAGNOSIS — I471 Supraventricular tachycardia: Secondary | ICD-10-CM | POA: Diagnosis not present

## 2014-09-24 NOTE — Patient Instructions (Signed)

## 2014-09-24 NOTE — Progress Notes (Signed)
Cardiology Office Note   Date:  09/24/2014   ID:  Kathryn Meyers, Kathryn Meyers 1945-12-31, MRN [id number]  PCP:  Deloria Lair, MD  Cardiologist:  Dola Argyle, MD   Chief Complaint  Patient presents with  . Appointment    Follow-up sinus tachycardia      History of Present Illness: Kathryn Meyers is a 69 y.o. female who presents today to follow-up benign sinus tachycardia. She is actually doing well with this. She is now comfortable that she does not have any major cardiac problems. If her heart rate speeds up she relaxes and feels better. She is on a beta blocker.    Past Medical History  Diagnosis Date  . Ejection fraction     EF 60-65%, 2009  //   EF 60-65%, no wall motion abnormalities, echo, Jun 23, 2011  . Sinus tachycardia     Mild persistent sinus tachycardia 2009,   . Tobacco abuse   . Palpitations     February, 2013  . Sinus infection     February, 2013  . Nail patellar syndrome     Abnormal nails of the hands usually the thumb, absent or hypertrophic patella,  abnormal elbows, autosomal dominant, must be very careful with renal function  . Abnormal EKG     Old diffuse ST scooping    Past Surgical History  Procedure Laterality Date  . Growth in ear removed      1994  . Tonsillectomy      age 18  . Cholecystectomy  1982    cholecystitis  . Cesarean section      x 4  . Two knee surgeries    . Colonoscopy N/A 06/19/2014    Procedure: COLONOSCOPY;  Surgeon: Rogene Houston, MD;  [facility];  Service: Endoscopy;  Laterality: N/A;  730 - moved to 5/26 @ 12:00    Patient Active Problem List   Diagnosis Date Noted  . Guaiac positive stools 05/01/2014  . Diarrhea 05/01/2014  . Hypokalemia 03/12/2014  . Vertigo 12/23/2013  . Dyspnea on exertion 12/23/2013  . Ejection fraction   . Nail patellar syndrome   . Abnormal EKG   . Palpitations   . Sinus infection   . Sinus tachycardia   . Tobacco abuse       Current Outpatient Prescriptions    Medication Sig Dispense Refill  . acetaminophen (TYLENOL) 500 MG tablet Take 500 mg by mouth every 6 (six) hours as needed for moderate pain.    . calcium carbonate (OS-CAL) 600 MG TABS tablet Take 600 mg by mouth 2 (two) times daily with a meal.    . cholecalciferol (VITAMIN D) 400 UNITS TABS tablet Take 5,000 Units by mouth.    . cyanocobalamin (,VITAMIN B-12,) 1000 MCG/ML injection Inject 1,000 mcg into the muscle every 30 (thirty) days.    . metoprolol (LOPRESSOR) 50 MG tablet Take 50 mg by mouth 2 (two) times daily.     No current facility-administered medications for this visit.    Allergies:   Nsaids; Cortisone; and Demerol    Social History:  The patient  reports that she has been smoking Cigarettes.  She has been smoking about 1.00 pack per day. She has never used smokeless tobacco. She reports that she does not drink alcohol or use illicit drugs.   Family History:  The patient's  There is no significant family history of coronary disease.   ROS:  Please see the history of present illness.  Patient denies fever, chills, headache, sweats, rash, change in vision, change in hearing, chest pain, cough, nausea or vomiting, urinary symptoms. All other systems are reviewed and are negative.   PHYSICAL EXAM: VS:  BP 131/80 mmHg  Pulse 88  Ht 4\' 10"  (1.473 m)  Wt 145 lb 1.9 oz (65.826 kg)  BMI 30.34 kg/m2  SpO2 98% , Patient is oriented to person time and place. Affect is normal. Head is atraumatic. Sclera and conjunctiva are normal. There is no jugulovenous distention. Lungs are clear. Respiratory effort is nonlabored. Cardiac exam reveals S1 and S2. The abdomen is soft. There is no peripheral edema. There are no musculoskeletal deformities. There are no skin rashes.  EKG:   EKG is not done today.   Recent Labs: 02/09/2014: BUN 7; Creatinine, Ser 0.55; Potassium 2.4*; Sodium 139 05/01/2014: Hemoglobin 15.4*; Platelets 363    Lipid Panel No results found for: CHOL, TRIG,  HDL, CHOLHDL, VLDL, LDLCALC, LDLDIRECT    Wt Readings from Last 3 Encounters:  09/24/14 145 lb 1.9 oz (65.826 kg)  06/19/14 146 lb (66.225 kg)  05/01/14 146 lb 9.6 oz (66.497 kg)      Current medicines are reviewed  The patient understands her medications.   ASSESSMENT AND PLAN:

## 2014-09-24 NOTE — Assessment & Plan Note (Signed)
The patient has this syndrome. It is associated with abnormal nails of the hands, usually the thumb. The patient's patella by history is either absent or hypertrophic. There can be abnormal elbows. This is autosomal dominant. Renal function has to be followed over time. There is no related cardiac abnormality with this syndrome.

## 2014-09-24 NOTE — Assessment & Plan Note (Signed)
Over time the patient has had sinus tachycardia that is treated with a beta blocker. It is benign sinus tachycardia. Heart rate today is 88. She has normal left ventricular function. No further workup.

## 2014-09-26 ENCOUNTER — Encounter (HOSPITAL_COMMUNITY)
Admission: RE | Disposition: A | Discharge status: Home / Self Care | Payer: Self-pay | Source: Ambulatory Visit | Attending: Internal Medicine

## 2014-09-26 ENCOUNTER — Ambulatory Visit (HOSPITAL_COMMUNITY)
Admission: RE | Admit: 2014-09-26 | Discharge: 2014-09-26 | Disposition: A | Discharge status: Home / Self Care | Payer: Medicare Other | Source: Ambulatory Visit | Attending: Internal Medicine | Admitting: Internal Medicine

## 2014-09-26 ENCOUNTER — Encounter (HOSPITAL_COMMUNITY): Payer: Self-pay | Admitting: *Deleted

## 2014-09-26 DIAGNOSIS — K648 Other hemorrhoids: Secondary | ICD-10-CM

## 2014-09-26 DIAGNOSIS — F17210 Nicotine dependence, cigarettes, uncomplicated: Secondary | ICD-10-CM | POA: Insufficient documentation

## 2014-09-26 DIAGNOSIS — Z888 Allergy status to other drugs, medicaments and biological substances status: Secondary | ICD-10-CM | POA: Insufficient documentation

## 2014-09-26 DIAGNOSIS — D123 Benign neoplasm of transverse colon: Secondary | ICD-10-CM | POA: Insufficient documentation

## 2014-09-26 DIAGNOSIS — D125 Benign neoplasm of sigmoid colon: Secondary | ICD-10-CM | POA: Diagnosis not present

## 2014-09-26 DIAGNOSIS — Z86010 Personal history of colonic polyps: Secondary | ICD-10-CM

## 2014-09-26 DIAGNOSIS — Z09 Encounter for follow-up examination after completed treatment for conditions other than malignant neoplasm: Secondary | ICD-10-CM | POA: Insufficient documentation

## 2014-09-26 DIAGNOSIS — Z800 Family history of malignant neoplasm of digestive organs: Secondary | ICD-10-CM | POA: Insufficient documentation

## 2014-09-26 DIAGNOSIS — K5730 Diverticulosis of large intestine without perforation or abscess without bleeding: Secondary | ICD-10-CM | POA: Insufficient documentation

## 2014-09-26 DIAGNOSIS — D128 Benign neoplasm of rectum: Secondary | ICD-10-CM | POA: Diagnosis not present

## 2014-09-26 DIAGNOSIS — Z79899 Other long term (current) drug therapy: Secondary | ICD-10-CM | POA: Insufficient documentation

## 2014-09-26 DIAGNOSIS — D124 Benign neoplasm of descending colon: Secondary | ICD-10-CM

## 2014-09-26 DIAGNOSIS — Z886 Allergy status to analgesic agent status: Secondary | ICD-10-CM | POA: Insufficient documentation

## 2014-09-26 DIAGNOSIS — K644 Residual hemorrhoidal skin tags: Secondary | ICD-10-CM | POA: Diagnosis not present

## 2014-09-26 HISTORY — PX: COLONOSCOPY: SHX5424

## 2014-09-26 SURGERY — COLONOSCOPY
Anesthesia: Moderate Sedation

## 2014-09-26 MED ORDER — MIDAZOLAM HCL 5 MG/5ML IJ SOLN
INTRAMUSCULAR | Status: AC
  Filled 2014-09-26: qty 5

## 2014-09-26 MED ORDER — FENTANYL CITRATE (PF) 100 MCG/2ML IJ SOLN
INTRAMUSCULAR | Status: DC | PRN
  Administered 2014-09-26 (×4): 25 ug via INTRAVENOUS

## 2014-09-26 MED ORDER — MIDAZOLAM HCL 5 MG/5ML IJ SOLN
INTRAMUSCULAR | Status: AC
  Filled 2014-09-26: qty 10

## 2014-09-26 MED ORDER — MIDAZOLAM HCL 5 MG/5ML IJ SOLN
INTRAMUSCULAR | Status: DC | PRN
  Administered 2014-09-26: 2 mg via INTRAVENOUS
  Administered 2014-09-26 (×2): 1 mg via INTRAVENOUS
  Administered 2014-09-26: 2 mg via INTRAVENOUS
  Administered 2014-09-26: 3 mg via INTRAVENOUS
  Administered 2014-09-26: 1 mg via INTRAVENOUS
  Administered 2014-09-26: 2 mg via INTRAVENOUS

## 2014-09-26 MED ORDER — MEPERIDINE HCL 50 MG/ML IJ SOLN
INTRAMUSCULAR | Status: DC
Start: 2014-09-26 — End: 2014-09-26
  Filled 2014-09-26: qty 1

## 2014-09-26 MED ORDER — FENTANYL CITRATE (PF) 100 MCG/2ML IJ SOLN
INTRAMUSCULAR | Status: AC
  Filled 2014-09-26: qty 2

## 2014-09-26 MED ORDER — METRONIDAZOLE 500 MG PO TABS: 500.0000 mg | ORAL_TABLET | Freq: Three times a day (TID) | ORAL | Status: DC

## 2014-09-26 MED ORDER — STERILE WATER FOR IRRIGATION IR SOLN
Status: DC | PRN
  Administered 2014-09-26 (×2)

## 2014-09-26 MED ORDER — SODIUM CHLORIDE 0.9 % IV SOLN
INTRAVENOUS | Status: DC
  Administered 2014-09-26: 11:00:00 via INTRAVENOUS

## 2014-09-26 NOTE — H&P (Signed)
Kathryn Meyers is an 69 y.o. female.   Chief Complaint: Patient is here for colonoscopy with polypectomy. HPI: Patient is 69 year old Caucasian female underwent colonoscopy on 06/19/2014 with removal of multiple polyps. She has a few more small ones left and therefore returning for repeat exam. She was treated for C. difficile over 2 months ago. She's not having normal stools. She denies rectal bleeding abdominal pain. Family history significant for colon carcinoma in maternal grandmother who was 35 at the time of diagnosis and died within few months of advanced disease.  Past Medical History  Diagnosis Date  . Ejection fraction     EF 60-65%, 2009  //   EF 60-65%, no wall motion abnormalities, echo, Jun 23, 2011  . Sinus tachycardia     Mild persistent sinus tachycardia 2009,   . Tobacco abuse   . Palpitations     February, 2013  . Sinus infection     February, 2013  . Nail patellar syndrome     Abnormal nails of the hands usually the thumb, absent or hypertrophic patella,  abnormal elbows, autosomal dominant, must be very careful with renal function  . Abnormal EKG     Old diffuse ST scooping    Past Surgical History  Procedure Laterality Date  . Growth in ear removed      1994  . Tonsillectomy      age 57  . Cholecystectomy  1982    cholecystitis  . Cesarean section      x 4  . Two knee surgeries    . Colonoscopy N/A 06/19/2014    Procedure: COLONOSCOPY;  Surgeon: Rogene Houston, MD;  [facility];  Service: Endoscopy;  Laterality: N/A;  730 - moved to 5/26 @ 12:00    No family history on file. Social History:  reports that she has been smoking Cigarettes.  She has been smoking about 1.00 pack per day. She has never used smokeless tobacco. She reports that she does not drink alcohol or use illicit drugs.  Allergies:  Allergies  Allergen Reactions  . Nsaids Other (See Comments)    Rectal bleeding.  . Cortisone Other (See Comments)    Dizzy/flush/rapid  heart beat.  . Demerol [Meperidine] Nausea And Vomiting    Medications Prior to Admission  Medication Sig Dispense Refill  . acetaminophen (TYLENOL) 500 MG tablet Take 500 mg by mouth every 6 (six) hours as needed for moderate pain.    . calcium carbonate (OS-CAL) 600 MG TABS tablet Take 600 mg by mouth 2 (two) times daily with a meal.    . cholecalciferol (VITAMIN D) 400 UNITS TABS tablet Take 5,000 Units by mouth.    . cyanocobalamin (,VITAMIN B-12,) 1000 MCG/ML injection Inject 1,000 mcg into the muscle every 30 (thirty) days.    . metoprolol (LOPRESSOR) 50 MG tablet Take 50 mg by mouth 2 (two) times daily.      No results found for this or any previous visit (from the past 48 hour(s)). No results found.  ROS  Blood pressure 136/86, pulse 91, temperature 98.4 F (36.9 C), temperature source Oral, resp. rate 18, SpO2 98 %. Physical Exam  Constitutional: She appears well-developed and well-nourished.  HENT:  Mouth/Throat: Oropharynx is clear and moist.  Eyes: Conjunctivae are normal. No scleral icterus.  Neck: No thyromegaly present.  Cardiovascular: Normal rate, regular rhythm and normal heart sounds.   No murmur heard. Respiratory: Effort normal and breath sounds normal.  GI: Soft. She exhibits no  distension and no mass. There is no tenderness.  Musculoskeletal: She exhibits no edema.  Lymphadenopathy:    She has no cervical adenopathy.  Neurological: She is alert.  Skin: Skin is warm and dry.     Assessment/Plan History of multiple colonic adenomas. Colonoscopy with polypectomy.  Dhwani Venkatesh U 09/26/2014, 11:05 AM

## 2014-09-26 NOTE — Discharge Instructions (Signed)
No aspirin or NSAIDs for 10 days. Resume usual medications and diet. No driving for 24 hours. Call office if you have to go back on metronidazole for diarrhea. Physician will call with biopsy results.    Colonoscopy A colonoscopy is an exam to look at the entire large intestine (colon). This exam can help find problems such as tumors, polyps, inflammation, and areas of bleeding. The exam takes about 1 hour.  LET [facility] CARE PROVIDER KNOW ABOUT:   Any allergies you have.  All medicines you are taking, including vitamins, herbs, eye drops, creams, and over-the-counter medicines.  Previous problems you or members of your family have had with the use of anesthetics.  Any blood disorders you have.  Previous surgeries you have had.  Medical conditions you have. RISKS AND COMPLICATIONS  Generally, this is a safe procedure. However, as with any procedure, complications can occur. Possible complications include:  Bleeding.  Tearing or rupture of the colon wall.  Reaction to medicines given during the exam.  Infection (rare). BEFORE THE PROCEDURE   Ask your health care provider about changing or stopping your regular medicines.  You may be prescribed an oral bowel prep. This involves drinking a large amount of medicated liquid, starting the day before your procedure. The liquid will cause you to have multiple loose stools until your stool is almost clear or light green. This cleans out your colon in preparation for the procedure.  Do not eat or drink anything else once you have started the bowel prep, unless your health care provider tells you it is safe to do so.  Arrange for someone to drive you home after the procedure. PROCEDURE   You will be given medicine to help you relax (sedative).  You will lie on your side with your knees bent.  A long, flexible tube with a light and camera on the end (colonoscope) will be inserted through the rectum and into the colon. The  camera sends video back to a computer screen as it moves through the colon. The colonoscope also releases carbon dioxide gas to inflate the colon. This helps your health care provider see the area better.  During the exam, your health care provider may take a small tissue sample (biopsy) to be examined under a microscope if any abnormalities are found.  The exam is finished when the entire colon has been viewed. AFTER THE PROCEDURE   Do not drive for 24 hours after the exam.  You may have a small amount of blood in your stool.  You may pass moderate amounts of gas and have mild abdominal cramping or bloating. This is caused by the gas used to inflate your colon during the exam.  Ask when your test results will be ready and how you will get your results. Make sure you get your test results.   Colon Polyps Polyps are lumps of extra tissue growing inside the body. Polyps can grow in the large intestine (colon). Most colon polyps are noncancerous (benign). However, some colon polyps can become cancerous over time. Polyps that are larger than a pea may be harmful. To be safe, caregivers remove and test all polyps. CAUSES  Polyps form when mutations in the genes cause your cells to grow and divide even though no more tissue is needed. RISK FACTORS There are a number of risk factors that can increase your chances of getting colon polyps. They include:  Being older than 50 years.  Family history of colon polyps or colon  cancer.  Long-term colon diseases, such as colitis or Crohn disease.  Being overweight.  Smoking.  Being inactive.  Drinking too much alcohol. SYMPTOMS  Most small polyps do not cause symptoms. If symptoms are present, they may include:  Blood in the stool. The stool may look dark red or black.  Constipation or diarrhea that lasts longer than 1 week. DIAGNOSIS People often do not know they have polyps until their caregiver finds them during a regular checkup. Your  caregiver can use 4 tests to check for polyps:  Digital rectal exam. The caregiver wears gloves and feels inside the rectum. This test would find polyps only in the rectum.  Barium enema. The caregiver puts a liquid called barium into your rectum before taking X-rays of your colon. Barium makes your colon look white. Polyps are dark, so they are easy to see in the X-ray pictures.  Sigmoidoscopy. A thin, flexible tube (sigmoidoscope) is placed into your rectum. The sigmoidoscope has a light and tiny camera in it. The caregiver uses the sigmoidoscope to look at the last third of your colon.  Colonoscopy. This test is like sigmoidoscopy, but the caregiver looks at the entire colon. This is the most common method for finding and removing polyps. TREATMENT  Any polyps will be removed during a sigmoidoscopy or colonoscopy. The polyps are then tested for cancer. PREVENTION  To help lower your risk of getting more colon polyps:  Eat plenty of fruits and vegetables. Avoid eating fatty foods.  Do not smoke.  Avoid drinking alcohol.  Exercise every day.  Lose weight if recommended by your caregiver.  Eat plenty of calcium and folate. Foods that are rich in calcium include milk, cheese, and broccoli. Foods that are rich in folate include chickpeas, kidney beans, and spinach. HOME CARE INSTRUCTIONS Keep all follow-up appointments as directed by your caregiver. You may need periodic exams to check for polyps. SEEK MEDICAL CARE IF: You notice bleeding during a bowel movement.

## 2014-09-26 NOTE — Op Note (Addendum)
COLONOSCOPY PROCEDURE REPORT  PATIENT:  Kathryn Meyers  MR#:  [id number] Birthdate:  [birth year], 69 y.o., female Endoscopist:  Dr. Rogene Houston, MD Referred By:  Dr. Audree Camel, MD mProcedure Date: 09/26/2014  Procedure:   Colonoscopy with snare polypectomy and APC therapy.  Indications:  Patient is 69 year old Caucasian female who underwent colonoscopy in May 2016 with removal of 14 polyps she said has or polyps to be removed.  Informed Consent:  The procedure and risks were reviewed with the patient and informed consent was obtained.  Medications:  Fentanyl 100 g IV Versed 12 mg IV  Description of procedure:  After a digital rectal exam was performed, that colonoscope was advanced from the anus through the rectum and colon to the area of the cecum, ileocecal valve and appendiceal orifice. The cecum was deeply intubated. These structures were well-seen and photographed for the record. From the level of the cecum and ileocecal valve, the scope was slowly and cautiously withdrawn. The mucosal surfaces were carefully surveyed utilizing scope tip to flexion to facilitate fold flattening as needed. The scope was pulled down into the rectum where a thorough exam including retroflexion was performed.  Findings:   Prep fair to satisfactory. 6 mm polyp hot snare from hepatic flexure. Three sessile polyps at transverse colon were snared and residual polyps treated with APC. These polyps were submitted along with one from hepatic flexure. Two small polyps distal transverse colon were ablated with APC. 12 mm polyp was snared piecemeal from region of splenic flexure. Margins were treated with APC. This polyp was submitted with olives her sigmoid colon. Five polyps were hot snared from sigmoid colon about 6 to 8 mm in diameter. 5 mm rectal polyp was hot snared and submitted with polyp from sigmoid colon and splenic flexure. Few small diverticula at sigmoid colon. Small hemorrhoids below the  dentate line.   Therapeutic/Diagnostic Maneuvers Performed:  See above  Complications:  None  Cecal Withdrawal Time:  49  minutes  Impression:  Examination performed to cecum. Four polyps were submitted together(hepatic flexure and transverse colon). Two small polyps distal transverse colon were ablated with APC. Six polyps were submitted together(splenic flexure, sigmoid colon and rectum). Mild sigmoid colon diverticulosis. Small external hemorrhoids.   APC was also used to ablated residual polyp at polypectomy site at transverse colon and splenic flexure.  Recommendations:  Standard instructions given. No aspirin or NSAIDs for 10 days. I will contact patient with biopsy results and further recommendations.  Aneudy Champlain U  09/26/2014 12:30 PM  CC: Dr. Deloria Lair, MD & Dr. Rayne Du ref. provider found

## 2014-10-02 ENCOUNTER — Encounter (HOSPITAL_COMMUNITY): Payer: Self-pay | Admitting: Internal Medicine

## 2015-04-01 ENCOUNTER — Telehealth (INDEPENDENT_AMBULATORY_CARE_PROVIDER_SITE_OTHER): Payer: Self-pay | Admitting: *Deleted

## 2015-04-01 NOTE — Telephone Encounter (Signed)
Spoke to patient, states you told her if she was ever given antibiotic to let you know -- she has had total knee replacement and one of the suture sites has gotten infected and her doctor wants her to take Keflex (cephalexin) 500 mg 4 tab daily time 7 days -- patient wants to know if this is ok with you (ph# [phone])

## 2015-04-02 NOTE — Telephone Encounter (Signed)
Patient aware of Dr Rehman's recommendation 

## 2015-04-02 NOTE — Telephone Encounter (Signed)
It is okay for her take Keflex as recommended. Please call patient.

## 2015-05-12 ENCOUNTER — Other Ambulatory Visit (HOSPITAL_COMMUNITY): Payer: Self-pay | Admitting: Internal Medicine

## 2015-05-12 DIAGNOSIS — R918 Other nonspecific abnormal finding of lung field: Secondary | ICD-10-CM

## 2015-05-19 ENCOUNTER — Ambulatory Visit (HOSPITAL_COMMUNITY)
Admission: RE | Admit: 2015-05-19 | Discharge: 2015-05-19 | Disposition: A | Discharge status: Home / Self Care | Payer: Medicare Other | Source: Ambulatory Visit | Attending: Internal Medicine | Admitting: Internal Medicine

## 2015-05-19 DIAGNOSIS — J439 Emphysema, unspecified: Secondary | ICD-10-CM | POA: Diagnosis not present

## 2015-05-19 DIAGNOSIS — R918 Other nonspecific abnormal finding of lung field: Secondary | ICD-10-CM | POA: Diagnosis not present

## 2015-05-19 DIAGNOSIS — F17219 Nicotine dependence, cigarettes, with unspecified nicotine-induced disorders: Secondary | ICD-10-CM | POA: Insufficient documentation

## 2015-05-19 DIAGNOSIS — Z720 Tobacco use: Secondary | ICD-10-CM | POA: Insufficient documentation

## 2015-05-19 DIAGNOSIS — I2510 Atherosclerotic heart disease of native coronary artery without angina pectoris: Secondary | ICD-10-CM | POA: Diagnosis not present

## 2015-09-23 ENCOUNTER — Encounter: Payer: Self-pay | Admitting: *Deleted

## 2015-09-23 NOTE — Progress Notes (Signed)
Cardiology Office Note  Date: 09/24/2015   ID: Kathryn Meyers, DOB 1945-06-01, MRN [id number]  PCP: Kathryn Lair, MD  Primary Cardiologist: Kathryn Lesches, MD   Chief Complaint  Patient presents with  . Follow-up palpitations    History of Present Illness: Kathryn Meyers is a 70 y.o. female former patient of Kathryn Meyers, last seen in August 2016. She presents to establish follow-up with me, I reviewed records and updated the chart. It sounds like she has a history of inappropriate sinus tachycardia, has been on beta blocker long-term. She does report interval palpitations, but has had no prolonged symptoms recently.  In the past she was instructed to use Lopressor 50 mg 3 times a day, although she has preferred to keep it twice daily and only use an extra dose of Lopressor as needed.  She is here with her husband today. States that she had right knee surgery at [facility] in the interval since last evaluated. She has done well in this respect.  I reviewed her ECG today which shows sinus rhythm with diffuse inferior and anterolateral ST-T wave abnormalities, actually less prominent in comparison to the prior tracing in 2016.  Past Medical History:  Diagnosis Date  . Abnormal EKG   . 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 Palpitations   . Sinus infection   . Sinus tachycardia (HCC)    Mild persistent sinus tachycardia 2009 - on beta blocker    Past Surgical History:  Procedure Laterality Date  . CESAREAN SECTION     x 4  . CHOLECYSTECTOMY  1982   cholecystitis  . COLONOSCOPY N/A 06/19/2014   Procedure: COLONOSCOPY;  Surgeon: Rogene Houston, MD;  [facility];  Service: Endoscopy;  Laterality: N/A;  730 - moved to 5/26 @ 12:00  . COLONOSCOPY N/A 09/26/2014   Procedure: COLONOSCOPY;  Surgeon: Rogene Houston, MD;  [facility];  Service: Endoscopy;  Laterality: N/A;  1020 - moved to 11:15 - [name] to notify pt  . Growth in ear removed     1994  . KNEE SURGERY    . TONSILLECTOMY     age 59    Current Outpatient Prescriptions  Medication Sig Dispense Refill  . acetaminophen (TYLENOL) 500 MG tablet Take 500 mg by mouth every 6 (six) hours as needed for moderate pain.    . cholecalciferol (VITAMIN D) 400 UNITS TABS tablet Take 5,000 Units by mouth.    . metoprolol (LOPRESSOR) 50 MG tablet Take 50 mg by mouth 2 (two) times daily.    Marland Kitchen PREBIOTIC PRODUCT PO Take by mouth. Restore    . Probiotic Product (PROBIOTIC ADVANCED PO) Take by mouth. prosynbiotic     No current facility-administered medications for this visit.    Allergies:  Nsaids; Cortisone; and Demerol [meperidine]   Social History: The patient  reports that she has been smoking Cigarettes.  She has been smoking about 1.00 pack per day. She has never used smokeless tobacco. She reports that she does not drink alcohol or use drugs.   ROS:  Please see the history of present illness. Otherwise, complete review of systems is positive for none.  All other systems are reviewed and negative.   Physical Exam: VS:  BP 138/82   Pulse 95   Ht 4\' 10"  (1.473 m)   Wt  140 lb (63.5 kg)   SpO2 95%   BMI 29.26 kg/m , BMI Body mass index is 29.26 kg/m.  Wt Readings from Last 3 Encounters:  09/24/15 140 lb (63.5 kg)  09/24/14 145 lb 1.9 oz (65.8 kg)  06/19/14 146 lb (66.2 kg)    General: Overweight, short statured woman, appears comfortable at rest. HEENT: Conjunctiva and lids normal, oropharynx clear. Neck: Supple, no elevated JVP or carotid bruits, no thyromegaly. Lungs: Clear to auscultation, nonlabored breathing at rest. Cardiac: Regular rate and rhythm, no S3 or significant systolic murmur. Abdomen: Soft, nontender, bowel sounds present, no guarding or rebound. Extremities: No pitting edema, distal pulses 2+. Skin: Warm and  dry. Musculoskeletal: No kyphosis. Neuropsychiatric: Alert and oriented x3, affect grossly appropriate.  ECG: I personally reviewed the tracing from 02/09/2014 which showed sinus rhythm with short PR and prominent diffuse ST-T wave abnormalities that are old.  Recent Labwork:  January 2016: BUN 8, creatinine 0.7, potassium 3.7  Other Studies Reviewed Today:  Echocardiogram 5/70/2013: Study Conclusions  - Left ventricle: The cavity size was normal. Wall thickness was at the upper limits of normal. Systolic function was normal. The estimated ejection fraction was in the range of 60% to 65%. Wall motion was normal; there were no regional wall motion abnormalities. Left ventricular diastolic function parameters were normal. - Mitral valve: Trivial regurgitation. - Tricuspid valve: Mild regurgitation directed eccentrically. Peak RV-RA gradient: 4mm Hg (S). - Pulmonary arteries: Systolic pressure was within the normal range. - Pericardium, extracardiac: There was no pericardial effusion.  Assessment and Plan:  1. Apparent inappropriate sinus tachycardia based on chart review, former patient of Kathryn Meyers. She has been on Lopressor long-term, currently taking 50 mg twice daily. Heart rate at rest is in the 80s to 90s. She does report occasional palpitations. If symptoms worsen, would suggest increasing Lopressor to 75 mg twice daily. Alternatively, she could use Lopressor 50 mg tablet in addition to her standing dose as needed.  2. Chronically abnormal ECG.  Current medicines were reviewed with the patient today.   Orders Placed This Encounter  Procedures  . EKG 12-Lead    Disposition: Follow-up with me in one year.  Signed, Kathryn Sark, MD, [facility] 09/24/2015 11:26 AM    [facility] [place], [place],  [address] Phone: [phone]; Fax: [phone]

## 2015-09-24 ENCOUNTER — Encounter: Payer: Self-pay | Admitting: Cardiology

## 2015-09-24 ENCOUNTER — Ambulatory Visit (INDEPENDENT_AMBULATORY_CARE_PROVIDER_SITE_OTHER): Payer: Medicare Other | Admitting: Cardiology

## 2015-09-24 VITALS — BP 138/82 | HR 95 | Ht <= 58 in | Wt 140.0 lb

## 2015-09-24 DIAGNOSIS — R000 Tachycardia, unspecified: Secondary | ICD-10-CM | POA: Diagnosis not present

## 2015-09-24 DIAGNOSIS — R9431 Abnormal electrocardiogram [ECG] [EKG]: Secondary | ICD-10-CM

## 2015-09-24 MED ORDER — METOPROLOL TARTRATE 50 MG PO TABS: 50.0000 mg | ORAL_TABLET | Freq: Three times a day (TID) | ORAL | 3 refills | Status: DC

## 2015-09-24 NOTE — Patient Instructions (Signed)
Medication Instructions:   Lopressor refilled today - sent to [facility] for 90 day supply.  Continue all other medications.    Labwork: none  Testing/Procedures: none  Follow-Up: Your physician wants you to follow up in:  1 year.  You will receive a reminder letter in the mail one-two months in advance.  If you don't receive a letter, please call our office to schedule the follow up appointment   Any Other Special Instructions Will Be Listed Below (If Applicable).  If you need a refill on your cardiac medications before your next appointment, please call your pharmacy.

## 2015-10-13 ENCOUNTER — Encounter (INDEPENDENT_AMBULATORY_CARE_PROVIDER_SITE_OTHER): Payer: Self-pay | Admitting: Internal Medicine

## 2015-10-13 ENCOUNTER — Ambulatory Visit (INDEPENDENT_AMBULATORY_CARE_PROVIDER_SITE_OTHER): Payer: Medicare Other | Admitting: Internal Medicine

## 2015-10-13 VITALS — BP 122/64 | HR 80 | Temp 97.7°F | Ht <= 58 in | Wt 144.8 lb

## 2015-10-13 DIAGNOSIS — R197 Diarrhea, unspecified: Secondary | ICD-10-CM

## 2015-10-13 MED ORDER — DICYCLOMINE HCL 10 MG PO CAPS: 10.0000 mg | ORAL_CAPSULE | Freq: Three times a day (TID) | ORAL | 3 refills | Status: DC

## 2015-10-13 NOTE — Progress Notes (Signed)
Subjective:    Patient ID: Kathryn Meyers, female    DOB: 1945/04/23, 70 y.o.   MRN: [id number]  HPI Here today for f/u. She says she has diarrhea which is chronic.  frequently. She has about 3 stools a day on a bad day. Takes Imodium as needed.  Appetite is good. No weight loss. No abdominal pain.  She denies any melena or BRRB.  Hx of C difficile in 05/02/2014. Treated with Flagyl. Had rt knee replacement this year.      09/26/2014:   Colonoscopy with snare polypectomy and APC therapy.  Indications:  Patient is 70 year old Caucasian female who underwent colonoscopy in May 2016 with removal of 14 polyps she said has or polyps to be removed. Prep fair to satisfactory. 6 mm polyp hot snare from hepatic flexure. Three sessile polyps at transverse colon were snared and residual polyps treated with APC. These polyps were submitted along with one from hepatic flexure. Two small polyps distal transverse colon were ablated with APC. 12 mm polyp was snared piecemeal from region of splenic flexure. Margins were treated with APC. This polyp was submitted with olives her sigmoid colon. Five polyps were hot snared from sigmoid colon about 6 to 8 mm in diameter. 5 mm rectal polyp was hot snared and submitted with polyp from sigmoid colon and splenic flexure. Few small diverticula at sigmoid colon. Small hemorrhoids below the dentate line.  All polyps are tubular adenomas. Results reviewed with patient. She says she has heartburn every now and then. She states is generally once a week. She tried omeprazole but it did not help. She denies dysphagia. She wonders if she should undergo EGD. So for I do not see any need or indication. Patient advised to keep symptom diary for the next month and call us with progress reports. Report to PCP and Dr. Loistine Simas. Repeat colonoscopy in 3 years     Review of Systems     Past Medical History:  Diagnosis Date  . Abnormal EKG   . Nail patellar  syndrome    Abnormal nails of the hands usually the thumb, absent or hypertrophic patella, abnormal elbows, autosomal dominant, must be very careful with renal function  . Palpitations   . Shingles   . Sinus infection   . Sinus tachycardia (HCC)    Mild persistent sinus tachycardia 2009 - on beta blocker    Past Surgical History:  Procedure Laterality Date  . CESAREAN SECTION     x 4  . CHOLECYSTECTOMY  1982   cholecystitis  . COLONOSCOPY N/A 06/19/2014   Procedure: COLONOSCOPY;  Surgeon: Rogene Houston, MD;  [facility];  Service: Endoscopy;  Laterality: N/A;  730 - moved to 5/26 @ 12:00  . COLONOSCOPY N/A 09/26/2014   Procedure: COLONOSCOPY;  Surgeon: Rogene Houston, MD;  Location: [facility];  Service: Endoscopy;  Laterality: N/A;  1020 - moved to 11:15 - [name] to notify pt  . Growth in ear removed     1994  . KNEE SURGERY    . rt knee replacement     2017  . TONSILLECTOMY     age 25    Allergies  Allergen Reactions  . Nsaids Other (See Comments)    Rectal bleeding.  . Cortisone Other (See Comments)    Dizzy/flush/rapid heart beat.  . Demerol [Meperidine] Nausea And Vomiting    Current Outpatient Prescriptions on File Prior to Visit  Medication Sig Dispense Refill  . acetaminophen (TYLENOL)  500 MG tablet Take 500 mg by mouth every 6 (six) hours as needed for moderate pain.    . cholecalciferol (VITAMIN D) 400 UNITS TABS tablet Take 5,000 Units by mouth.    . metoprolol (LOPRESSOR) 50 MG tablet Take 1 tablet (50 mg total) by mouth 3 (three) times daily. (Patient taking differently: Take 50 mg by mouth 2 (two) times daily. ) 270 tablet 3  . PREBIOTIC PRODUCT PO Take by mouth. Restore    . Probiotic Product (PROBIOTIC ADVANCED PO) Take by mouth. prosynbiotic     No current facility-administered medications on file prior to visit.     Objective:   Physical Exam Blood pressure 122/64, pulse 80, temperature 97.7 F (36.5 C), height 4\' 8"  (1.422 m), weight  144 lb 12.8 oz (65.7 kg).  Alert and oriented. Skin warm and dry. Oral mucosa is moist.   . Sclera anicteric, conjunctivae is pink. Thyroid not enlarged. No cervical lymphadenopathy. Lungs clear. Heart regular rate and rhythm.  Abdomen is soft. Bowel sounds are positive. No hepatomegaly. No abdominal masses felt. No tenderness.  No edema to lower extremities.        Assessment & Plan:  Chronic diarrhea. Continue the Imodium as needed.  Rx for Dicyclomine TID.  OV in 1 year.

## 2015-10-13 NOTE — Patient Instructions (Signed)
OV in 1 year.  

## 2016-07-12 ENCOUNTER — Other Ambulatory Visit: Payer: Self-pay | Admitting: Cardiology

## 2016-08-26 ENCOUNTER — Encounter (INDEPENDENT_AMBULATORY_CARE_PROVIDER_SITE_OTHER): Payer: Self-pay | Admitting: Internal Medicine

## 2016-09-09 NOTE — Progress Notes (Signed)
Cardiology Office Note  Date: 09/12/2016   ID: Kathryn, Meyers 17-Apr-1945, MRN [id number]  PCP: Deloria Lair., MD  Primary Cardiologist: Rozann Lesches, MD   Chief Complaint  Patient presents with  . History of inappropriate sinus tachycardia    History of Present Illness: Kathryn Meyers is a 71 y.o. female that I met in August of last year. She is here with her husband today for a routine follow-up visit. She reports no significant palpitations or chest pain. No syncope.  I reviewed her medications. She is now on Aldactone per Dr. Scotty Court for treatment of hypertension, also reportedly had low potassium which has improved.  I personally reviewed her ECG today which shows sinus rhythm with short PR interval and diffuse nonspecific ST-T wave abnormalities.  Past Medical History:  Diagnosis Date  . Abnormal EKG   . 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 Palpitations   . Shingles   . Sinus infection   . Sinus tachycardia    Mild persistent sinus tachycardia 2009 - on beta blocker    Past Surgical History:  Procedure Laterality Date  . CESAREAN SECTION     x 4  . CHOLECYSTECTOMY  1982   cholecystitis  . COLONOSCOPY N/A 06/19/2014   Procedure: COLONOSCOPY;  Surgeon: Kathryn Houston, MD;  [facility];  Service: Endoscopy;  Laterality: N/A;  730 - moved to 5/26 @ 12:00  . COLONOSCOPY N/A 09/26/2014   Procedure: COLONOSCOPY;  Surgeon: Kathryn Houston, MD;  [facility];  Service: Endoscopy;  Laterality: N/A;  1020 - moved to 11:15 - [name] to notify pt  . Growth in ear removed     1994  . KNEE SURGERY    . rt knee replacement     2017  . TONSILLECTOMY     age 89    Current Outpatient Prescriptions  Medication Sig Dispense Refill  . acetaminophen (TYLENOL) 500 MG tablet Take 500 mg by mouth every 6  (six) hours as needed for moderate pain.    . cholecalciferol (VITAMIN D) 400 UNITS TABS tablet Take 5,000 Units by mouth.    . dicyclomine (BENTYL) 10 MG capsule Take 1 capsule (10 mg total) by mouth 3 (three) times daily before meals. 90 capsule 3  . latanoprost (XALATAN) 0.005 % ophthalmic solution Place 1 drop into both eyes at bedtime.    Marland Kitchen loperamide (IMODIUM A-D) 2 MG tablet Take 2 mg by mouth 4 (four) times daily as needed for diarrhea or loose stools.    . metoprolol tartrate (LOPRESSOR) 50 MG tablet TAKE 1 TABLET THREE TIMES DAILY 270 tablet 3  . PREBIOTIC PRODUCT PO Take by mouth. Restore    . Probiotic Product (PROBIOTIC ADVANCED PO) Take by mouth. prosynbiotic    . spironolactone (ALDACTONE) 25 MG tablet Take 25 mg by mouth 2 (two) times daily.     No current facility-administered medications for this visit.    Allergies:  Nsaids; Cortisone; and Demerol [meperidine]   Social History: The patient  reports that she has been smoking Cigarettes.  She has been smoking about 1.00 pack per day. She has never used smokeless tobacco. She reports that she does not drink alcohol or use drugs.   ROS:  Please see the history of present illness. Otherwise, complete review of systems is positive for  right knee replacement.  All other systems are reviewed and negative.   Physical Exam: VS:  BP 130/74   Pulse 78   Ht _0  (1.422 m)   Wt 148 lb (67.1 kg)   SpO2 98%   BMI 33.18 kg/m , BMI Body mass index is 33.18 kg/m.  Wt Readings from Last 3 Encounters:  09/12/16 148 lb (67.1 kg)  10/13/15 144 lb 12.8 oz (65.7 kg)  09/24/15 140 lb (63.5 kg)    General: Overweight, short statured woman, appears comfortable at rest. HEENT: Conjunctiva and lids normal, oropharynx clear. Neck: Supple, no elevated JVP or carotid bruits, no thyromegaly. Lungs: Clear to auscultation, nonlabored breathing at rest. Cardiac: Regular rate and rhythm, no S3 or significant systolic murmur. Abdomen: Soft,  nontender, bowel sounds present, no guarding or rebound. Extremities: No pitting edema, distal pulses 2+.  ECG: I personally reviewed the tracing from 09/24/2015 which showed sinus rhythm with diffuse nonspecific ST-T wave abnormalities.  Recent Labwork:  January 2016: BUN 8, creatinine 0.7, potassium 3.7  Other Studies Reviewed Today:  Echocardiogram 5/70/2013: Study Conclusions  - Left ventricle: The cavity size was normal. Wall thickness was at the upper limits of normal. Systolic function was normal. The estimated ejection fraction was in the range of 60% to 65%. Wall motion was normal; there were no regional wall motion abnormalities. Left ventricular diastolic function parameters were normal. - Mitral valve: Trivial regurgitation. - Tricuspid valve: Mild regurgitation directed eccentrically. Peak RV-RA gradient: 66m Hg (S). - Pulmonary arteries: Systolic pressure was within the normal range. - Pericardium, extracardiac: There was no pericardial effusion.  Assessment and Plan:  1. History of inappropriate sinus tachycardia. She is tolerating Lopressor 50 mg 3 times daily and has good heart rate control today. I recommended a walking plan for exercise.  2. Elevated blood pressure with initiation of Aldactone per Dr. TScotty Courtwho is following lab work.  3. Chronically abnormal ECG.  Current medicines were reviewed with the patient today.   Orders Placed This Encounter  Procedures  . EKG 12-Lead    Disposition: Follow-up in one year, sooner if needed.  Signed, SSatira Sark MD, FThe [facility]8/20/2018 11:38 AM    [place] [id number]: [phone] Fax: [phone]

## 2016-09-12 ENCOUNTER — Encounter: Payer: Self-pay | Admitting: Cardiology

## 2016-09-12 ENCOUNTER — Ambulatory Visit (INDEPENDENT_AMBULATORY_CARE_PROVIDER_SITE_OTHER): Payer: Medicare Other | Admitting: Cardiology

## 2016-09-12 VITALS — BP 130/74 | HR 78 | Ht <= 58 in | Wt 148.0 lb

## 2016-09-12 DIAGNOSIS — R000 Tachycardia, unspecified: Secondary | ICD-10-CM

## 2016-09-12 DIAGNOSIS — R9431 Abnormal electrocardiogram [ECG] [EKG]: Secondary | ICD-10-CM

## 2016-09-12 DIAGNOSIS — R030 Elevated blood-pressure reading, without diagnosis of hypertension: Secondary | ICD-10-CM | POA: Diagnosis not present

## 2016-09-12 NOTE — Patient Instructions (Signed)
Medication Instructions:  Your physician recommends that you continue on your current medications as directed. Please refer to the Current Medication list given to you today.  Labwork: none  Testing/Procedures: none  Follow-Up: Your physician wants you to follow-up in: 1 year with Dr. McDowell You will receive a reminder letter in the mail two months in advance. If you don't receive a letter, please call our office to schedule the follow-up appointment.  Any Other Special Instructions Will Be Listed Below (If Applicable).  If you need a refill on your cardiac medications before your next appointment, please call your pharmacy. 

## 2016-09-22 ENCOUNTER — Telehealth: Payer: Self-pay | Admitting: Cardiology

## 2016-09-22 NOTE — Telephone Encounter (Signed)
Pt c/o "fluttering and palpitations" worsening since last week. Says last night was the worst night could not stay asleep with palpitations waking her up every couple of hours. Denies chest pain/dizziness does c/o some SOB off and on - has not checked her HR when this happens - did check HR this AM was 95 - says she felt some better this morning but was concerned since palpitations kept waking her up last night. Pt has been taking Lopressor 1 & 1/2 tablets twice daily (3 tablets daily)

## 2016-09-22 NOTE — Telephone Encounter (Signed)
I just saw her recently in the office and she indicated that she was doing well at that time. My understanding is that she was taking Lopressor 50 mg 3 times a day, not 75 mg twice daily. May want to consider the 3 times a day dosing pattern with the third dose being closer to bedtime.

## 2016-09-22 NOTE — Telephone Encounter (Signed)
Pt voiced understanding and will take Lopressor 50 mg tid

## 2016-09-22 NOTE — Telephone Encounter (Signed)
Patient called stating that she continues to have palpitations since last Wednesday 09-14-16

## 2016-10-12 ENCOUNTER — Ambulatory Visit (INDEPENDENT_AMBULATORY_CARE_PROVIDER_SITE_OTHER): Payer: Medicare Other | Admitting: Internal Medicine

## 2016-10-12 ENCOUNTER — Encounter (INDEPENDENT_AMBULATORY_CARE_PROVIDER_SITE_OTHER): Payer: Self-pay | Admitting: Internal Medicine

## 2016-10-12 VITALS — BP 170/90 | HR 80 | Temp 97.7°F | Ht <= 58 in | Wt 166.4 lb

## 2016-10-12 DIAGNOSIS — R197 Diarrhea, unspecified: Secondary | ICD-10-CM

## 2016-10-12 NOTE — Patient Instructions (Signed)
Continue the Imodium as needed Increase fiber in diet.

## 2016-10-12 NOTE — Progress Notes (Signed)
Subjective:    Patient ID: Kathryn Meyers, female    DOB: 1945-08-30, 70 y.o.   MRN: [id number]  HPI PCP Dr. Scotty Court. Last seen 10/12/2015.  Hx of chronic diarrhea. She say her appetite is terrible. Lawt wegiht 144. Today her weight is 146.4. No abdominal pain. She has a BM daily. She averages 3 stools a day. When she eats, she will have a BM.  She says her stools go from formed to mushy to watery.  Does not get up during the night to have a BM. Hx of cholecystectomy in 1982 and her diarrhea started at this point.  Eats very little fiber or milk in her diet.    Hx of C difficile in 05/02/2014. Treated with Flagyl.   09/26/2014: Colonoscopy with snare polypectomy and APC therapy.  Indications:Patient is 71 year old Caucasian female who underwent colonoscopy in May 2016 with removal of 14 polyps she said has or polyps to be removed. Prep fair to satisfactory. 6 mm polyp hot snare from hepatic flexure. Three sessile polyps at transverse colon were snared and residual polyps treated with APC. These polyps were submitted along with one from hepatic flexure. Two small polyps distal transverse colon were ablated with APC. 12 mm polyp was snared piecemeal from region of splenic flexure. Margins were treated with APC. This polyp was submitted with olives her sigmoid colon. Five polyps were hot snared from sigmoid colon about 6 to 8 mm in diameter. 5 mm rectal polyp was hot snared and submitted with polyp from sigmoid colon and splenic flexure. Few small diverticula at sigmoid colon. Small hemorrhoids below the dentate line.  All polyps are tubular adenomas. Results reviewed with patient. She says she has heartburn every now and then. She states is generally once a week. She tried omeprazole but it did not help. She denies dysphagia. She wonders if she should undergo EGD. So for I do not see any need or indication. Patient advised to keep symptom diary for the next month and call us with  progress reports. Report to PCP and Dr. Loistine Simas. Repeat colonoscopy in 3 years   Review of Systems Past Medical History:  Diagnosis Date  . Abnormal EKG   . 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 Palpitations   . Shingles   . Sinus infection   . Sinus tachycardia    Mild persistent sinus tachycardia 2009 - on beta blocker    Past Surgical History:  Procedure Laterality Date  . CESAREAN SECTION     x 4  . CHOLECYSTECTOMY  1982   cholecystitis  . COLONOSCOPY N/A 06/19/2014   Procedure: COLONOSCOPY;  Surgeon: Rogene Houston, MD;  [facility];  Service: Endoscopy;  Laterality: N/A;  730 - moved to 5/26 @ 12:00  . COLONOSCOPY N/A 09/26/2014   Procedure: COLONOSCOPY;  Surgeon: Rogene Houston, MD;  [facility];  Service: Endoscopy;  Laterality: N/A;  1020 - moved to 11:15 - [name] to notify pt  . Growth in ear removed     1994  . KNEE SURGERY    . rt knee replacement     2017  . TONSILLECTOMY     age 40    Allergies  Allergen Reactions  . Nsaids Other (See Comments)    Rectal bleeding.  . Cortisone Other (See Comments)    Dizzy/flush/rapid  heart beat.  . Demerol [Meperidine] Nausea And Vomiting    Current Outpatient Prescriptions on File Prior to Visit  Medication Sig Dispense Refill  . acetaminophen (TYLENOL) 500 MG tablet Take 500 mg by mouth every 6 (six) hours as needed for moderate pain.    . cholecalciferol (VITAMIN D) 400 UNITS TABS tablet Take 5,000 Units by mouth.    . dicyclomine (BENTYL) 10 MG capsule Take 1 capsule (10 mg total) by mouth 3 (three) times daily before meals. 90 capsule 3  . latanoprost (XALATAN) 0.005 % ophthalmic solution Place 1 drop into both eyes at bedtime.    Marland Kitchen loperamide (IMODIUM A-D) 2 MG tablet Take 2 mg by mouth 4 (four) times daily as needed for diarrhea or loose  stools.    . metoprolol tartrate (LOPRESSOR) 50 MG tablet TAKE 1 TABLET THREE TIMES DAILY 270 tablet 3  . PREBIOTIC PRODUCT PO Take by mouth. Restore    . Probiotic Product (PROBIOTIC ADVANCED PO) Take by mouth. prosynbiotic    . spironolactone (ALDACTONE) 25 MG tablet Take 25 mg by mouth 2 (two) times daily.     No current facility-administered medications on file prior to visit.         Objective:   Physical Exam  Blood pressure (!) 170/90, pulse 80, temperature 97.7 F (36.5 C), height 4\' 10"  (1.473 m), weight 166 lb 6.4 oz (75.5 kg). Alert and oriented. Skin warm and dry. Oral mucosa is moist.   . Sclera anicteric, conjunctivae is pink. Thyroid not enlarged. No cervical lymphadenopathy. Lungs clear. Heart regular rate and rhythm.  Abdomen is soft. Bowel sounds are positive. No hepatomegaly. No abdominal masses felt. No tenderness.  No edema to lower extremities.         Assessment & Plan:  Chronic diarrhea. Continue the Imodium as needed. Encouraged fiber. Report to Dr. Scotty Court.  OV in 1 year

## 2016-10-14 ENCOUNTER — Telehealth: Payer: Self-pay | Admitting: Cardiology

## 2016-10-14 DIAGNOSIS — R002 Palpitations: Secondary | ICD-10-CM

## 2016-10-14 NOTE — Telephone Encounter (Signed)
Patient called today stating that her heart continues to flutter.. Please call [phone] .

## 2016-10-14 NOTE — Telephone Encounter (Signed)
Patient notified. Coming in Monday for 48 hour monitor to be placed

## 2016-10-14 NOTE — Telephone Encounter (Signed)
Patient states she is having "fluttering and palpitations" that have continued even with taking the medication three times a day. Patient states she is taking Lopressor 50 mg TID. Denies chest pain/dizziness or shortness of breath. Patient states she notices this worse at night or when she is at rest. Patient also states she has a lot going on with her personal life as well and thinks this might have contributed to it as well.

## 2016-10-14 NOTE — Telephone Encounter (Signed)
Not certain what to make of her symptoms. Rather than just continuing to increase the dose of her beta blocker, I would recommend that we get a 48 hour Holter monitor and clearly document what her heart rate variability is doing, also assess for arrhythmia.

## 2016-10-17 ENCOUNTER — Ambulatory Visit (INDEPENDENT_AMBULATORY_CARE_PROVIDER_SITE_OTHER): Payer: Medicare Other

## 2016-10-17 DIAGNOSIS — R002 Palpitations: Secondary | ICD-10-CM

## 2016-10-25 ENCOUNTER — Telehealth: Payer: Self-pay

## 2016-10-25 DIAGNOSIS — R000 Tachycardia, unspecified: Secondary | ICD-10-CM

## 2016-10-25 DIAGNOSIS — R002 Palpitations: Secondary | ICD-10-CM

## 2016-10-25 DIAGNOSIS — R9431 Abnormal electrocardiogram [ECG] [EKG]: Secondary | ICD-10-CM

## 2016-10-25 NOTE — Telephone Encounter (Signed)
-----   Message from Acquanetta Chain, LPN sent at 12/25/2409  2:47 PM EDT -----   ----- Message ----- From: Satira Sark, MD Sent: 10/25/2016   1:24 PM To: Merlene Laughter, LPN  Results reviewed. Monitor shows sinus rhythm but frequent PACs which she is likely feeling as palpitations. There were no sustained arrhythmias. I wonder if she might do better on long-acting metoprolol, consider switching to Toprol-XL 50 mg twice daily and then we can increase the dose from there. Would also please obtain an echocardiogram to ensure stable cardiac function. A copy of this test should be forwarded to Deloria Lair., MD.

## 2016-10-26 ENCOUNTER — Telehealth: Payer: Self-pay | Admitting: Cardiology

## 2016-10-26 MED ORDER — METOPROLOL SUCCINATE ER 50 MG PO TB24: 50.0000 mg | ORAL_TABLET | Freq: Two times a day (BID) | ORAL | 0 refills | Status: DC

## 2016-10-26 NOTE — Telephone Encounter (Signed)
Pre-cert Verification for the following procedure   ECHO scheduled for 11-03-16

## 2016-10-26 NOTE — Telephone Encounter (Signed)
Patient made aware and notified. Routed to PCP. Echo scheduled. New prescription sent to pharmacy.

## 2016-10-26 NOTE — Telephone Encounter (Signed)
-----   Message from Acquanetta Chain, LPN sent at 67/06/1946  2:47 PM EDT -----   ----- Message ----- From: Satira Sark, MD Sent: 10/25/2016   1:24 PM To: Merlene Laughter, LPN  Results reviewed. Monitor shows sinus rhythm but frequent PACs which she is likely feeling as palpitations. There were no sustained arrhythmias. I wonder if she might do better on long-acting metoprolol, consider switching to Toprol-XL 50 mg twice daily and then we can increase the dose from there. Would also please obtain an echocardiogram to ensure stable cardiac function. A copy of this test should be forwarded to Deloria Lair., MD.

## 2016-10-31 ENCOUNTER — Other Ambulatory Visit: Payer: Self-pay

## 2016-11-03 ENCOUNTER — Ambulatory Visit (INDEPENDENT_AMBULATORY_CARE_PROVIDER_SITE_OTHER): Payer: Medicare Other

## 2016-11-03 ENCOUNTER — Other Ambulatory Visit: Payer: Self-pay

## 2016-11-03 ENCOUNTER — Telehealth: Payer: Self-pay

## 2016-11-03 DIAGNOSIS — R000 Tachycardia, unspecified: Secondary | ICD-10-CM

## 2016-11-03 DIAGNOSIS — R9431 Abnormal electrocardiogram [ECG] [EKG]: Secondary | ICD-10-CM | POA: Diagnosis not present

## 2016-11-03 DIAGNOSIS — R002 Palpitations: Secondary | ICD-10-CM | POA: Diagnosis not present

## 2016-11-03 NOTE — Telephone Encounter (Signed)
Patient notified. Routed to PCP 

## 2016-11-03 NOTE — Telephone Encounter (Signed)
-----   Message from Acquanetta Chain, LPN sent at 40/34/7425 12:35 PM EDT -----   ----- Message ----- From: Satira Sark, MD Sent: 11/03/2016  11:57 AM To: Merlene Laughter, LPN  Results reviewed. Please let her know that LVEF remains normal at 60-65%. Continue with current plan. A copy of this test should be forwarded to Deloria Lair., MD.

## 2016-11-03 NOTE — Telephone Encounter (Signed)
LMTCB

## 2017-01-03 ENCOUNTER — Other Ambulatory Visit: Payer: Self-pay | Admitting: Cardiology

## 2017-03-06 ENCOUNTER — Emergency Department (HOSPITAL_COMMUNITY)
Admission: EM | Admit: 2017-03-06 | Discharge: 2017-03-06 | Disposition: A | Discharge status: Home / Self Care | Payer: Medicare Other | Source: Home / Self Care | Attending: Emergency Medicine | Admitting: Emergency Medicine

## 2017-03-06 ENCOUNTER — Emergency Department (HOSPITAL_COMMUNITY): Payer: Medicare Other

## 2017-03-06 ENCOUNTER — Encounter (HOSPITAL_COMMUNITY): Payer: Self-pay | Admitting: Emergency Medicine

## 2017-03-06 ENCOUNTER — Other Ambulatory Visit: Payer: Self-pay

## 2017-03-06 DIAGNOSIS — J449 Chronic obstructive pulmonary disease, unspecified: Secondary | ICD-10-CM | POA: Insufficient documentation

## 2017-03-06 DIAGNOSIS — Z96651 Presence of right artificial knee joint: Secondary | ICD-10-CM | POA: Insufficient documentation

## 2017-03-06 DIAGNOSIS — R1110 Vomiting, unspecified: Secondary | ICD-10-CM

## 2017-03-06 DIAGNOSIS — J111 Influenza due to unidentified influenza virus with other respiratory manifestations: Secondary | ICD-10-CM | POA: Insufficient documentation

## 2017-03-06 DIAGNOSIS — K8050 Calculus of bile duct without cholangitis or cholecystitis without obstruction: Secondary | ICD-10-CM | POA: Diagnosis not present

## 2017-03-06 DIAGNOSIS — R197 Diarrhea, unspecified: Secondary | ICD-10-CM | POA: Insufficient documentation

## 2017-03-06 DIAGNOSIS — Z79899 Other long term (current) drug therapy: Secondary | ICD-10-CM

## 2017-03-06 DIAGNOSIS — F17210 Nicotine dependence, cigarettes, uncomplicated: Secondary | ICD-10-CM

## 2017-03-06 DIAGNOSIS — A0472 Enterocolitis due to Clostridium difficile, not specified as recurrent: Secondary | ICD-10-CM | POA: Diagnosis not present

## 2017-03-06 DIAGNOSIS — E860 Dehydration: Secondary | ICD-10-CM

## 2017-03-06 HISTORY — DX: Unspecified glaucoma: H40.9

## 2017-03-06 LAB — COMPREHENSIVE METABOLIC PANEL
ALT: 46 U/L (ref 14–54)
AST: 70 U/L — ABNORMAL HIGH (ref 15–41)
Albumin: 3.6 g/dL (ref 3.5–5.0)
Alkaline Phosphatase: 91 U/L (ref 38–126)
Anion gap: 14 (ref 5–15)
BILIRUBIN TOTAL: 0.3 mg/dL (ref 0.3–1.2)
BUN: 23 mg/dL — ABNORMAL HIGH (ref 6–20)
CO2: 15 mmol/L — AB (ref 22–32)
CREATININE: 1.16 mg/dL — AB (ref 0.44–1.00)
Calcium: 8.4 mg/dL — ABNORMAL LOW (ref 8.9–10.3)
Chloride: 105 mmol/L (ref 101–111)
GFR calc non Af Amer: 47 mL/min — ABNORMAL LOW (ref >60)
GFR, EST AFRICAN AMERICAN: 54 mL/min — AB (ref >60)
GLUCOSE: 125 mg/dL — AB (ref 65–99)
Potassium: 3.3 mmol/L — ABNORMAL LOW (ref 3.5–5.1)
SODIUM: 134 mmol/L — AB (ref 135–145)
TOTAL PROTEIN: 7.8 g/dL (ref 6.5–8.1)

## 2017-03-06 LAB — CBC
HCT: 50.4 % — ABNORMAL HIGH (ref 36.0–46.0)
Hemoglobin: 16.8 g/dL — ABNORMAL HIGH (ref 12.0–15.0)
MCH: 29.5 pg (ref 26.0–34.0)
MCHC: 33.3 g/dL (ref 30.0–36.0)
MCV: 88.6 fL (ref 78.0–100.0)
Platelets: 276 10*3/uL (ref 150–400)
RBC: 5.69 MIL/uL — AB (ref 3.87–5.11)
RDW: 13.9 % (ref 11.5–15.5)
WBC: 6.1 10*3/uL (ref 4.0–10.5)

## 2017-03-06 LAB — URINALYSIS, ROUTINE W REFLEX MICROSCOPIC
Bilirubin Urine: NEGATIVE
Glucose, UA: NEGATIVE mg/dL
Hgb urine dipstick: NEGATIVE
Ketones, ur: NEGATIVE mg/dL
Leukocytes, UA: NEGATIVE
Nitrite: NEGATIVE
PH: 5 (ref 5.0–8.0)
Protein, ur: 30 mg/dL — AB
SPECIFIC GRAVITY, URINE: 1.015 (ref 1.005–1.030)

## 2017-03-06 LAB — INFLUENZA PANEL BY PCR (TYPE A & B)
INFLBPCR: NEGATIVE
Influenza A By PCR: POSITIVE — AB

## 2017-03-06 MED ORDER — ONDANSETRON HCL 4 MG/2ML IJ SOLN
4.0000 mg | Freq: Once | INTRAMUSCULAR | Status: AC
  Administered 2017-03-06: 4 mg via INTRAVENOUS
  Filled 2017-03-06: qty 2

## 2017-03-06 MED ORDER — ALBUTEROL SULFATE (2.5 MG/3ML) 0.083% IN NEBU
5.0000 mg | INHALATION_SOLUTION | Freq: Once | RESPIRATORY_TRACT | Status: AC
  Administered 2017-03-06: 5 mg via RESPIRATORY_TRACT
  Filled 2017-03-06: qty 6

## 2017-03-06 MED ORDER — ONDANSETRON 4 MG PO TBDP: 4.0000 mg | ORAL_TABLET | Freq: Three times a day (TID) | ORAL | 0 refills | Status: DC | PRN

## 2017-03-06 MED ORDER — METOPROLOL SUCCINATE ER 50 MG PO TB24
50.0000 mg | ORAL_TABLET | ORAL | Status: AC
  Administered 2017-03-06: 50 mg via ORAL
  Filled 2017-03-06: qty 1

## 2017-03-06 MED ORDER — SODIUM CHLORIDE 0.9 % IV SOLN: INTRAVENOUS | Status: DC

## 2017-03-06 MED ORDER — SODIUM CHLORIDE 0.9 % IV BOLUS (SEPSIS)
1000.0000 mL | Freq: Once | INTRAVENOUS | Status: AC
  Administered 2017-03-06: 1000 mL via INTRAVENOUS

## 2017-03-06 NOTE — ED Triage Notes (Signed)
Pt reports chills, body aches,fever,chills since last Monday. Pt reports emesis, diarrhea, cough has developed within last several days. Pt reports last dose of tylenol last night.

## 2017-03-06 NOTE — ED Notes (Signed)
Pt was informed that we need a urine sample. Pt states she can not urinate at this time. 

## 2017-03-06 NOTE — Discharge Instructions (Signed)
Your tests were positive for the flu and some dehydration Please have your kidney test rechecked within the next week Drink lots of Gatorade over the next couple of days Zofran every 6 hours as needed for nausea and vomiting Emergency department for severe or worsening symptoms

## 2017-03-06 NOTE — ED Provider Notes (Signed)
[facility] EMERGENCY DEPARTMENT Provider Note   CSN: [id number] Arrival date & time: 03/06/17  1225     History   Chief Complaint Chief Complaint  Patient presents with  . Generalized Body Aches    HPI Kathryn Meyers is a 72 y.o. female.  HPI  The patient is a 72 year old female, she has a known history of "sinus tachycardia", palpitations, chronic tobacco use and likely COPD as well as a history of hypokalemia.  She reports that about 1 week ago she had developed respiratory symptoms with coughing associated with body aches fevers chills and fatigue.  With taking some over-the-counter cough medications including Robitussin and Coricidin she improved and on Friday she was feeling better with her coughing however that is when she developed severe onset of acute nausea vomiting and diarrhea.  This has been persistent throughout the weekend, she did take a Zofran with some mild improvement but then it came back again.  Over the last 2 days she has not been able to take any food or fluids, she does not remember the last time she made urine, she reports that the stool is watery green without any blood and she is now dry heaving without any vomitus.  There is no chest pain or back pain, no headache, no sore throat though she feels like her mouth is dry.  She denies any rashes or dysuria and has diffuse mild abdominal pain.  Her significant other had similar symptoms though they were short-lived and several other family members have been diagnosed with positive flu.  Past Medical History:  Diagnosis Date  . Abnormal EKG   . Glaucoma   . 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 Palpitations   . Shingles   . Sinus infection   . Sinus tachycardia    Mild persistent sinus tachycardia 2009 - on beta blocker    Patient Active Problem List   Diagnosis Date Noted  . Lung nodules 09/24/2014  . Guaiac positive stools 05/01/2014  . Diarrhea 05/01/2014  . Hypokalemia 03/12/2014  . Vertigo 12/23/2013  . Dyspnea on exertion 12/23/2013  . Ejection fraction   . Nail patellar syndrome   . Abnormal EKG   . Palpitations   . Sinus infection   . Sinus tachycardia   . Tobacco abuse     Past Surgical History:  Procedure Laterality Date  . CESAREAN SECTION     x 4  . CHOLECYSTECTOMY  1982   cholecystitis  . COLONOSCOPY N/A 06/19/2014   Procedure: COLONOSCOPY;  Surgeon: Rogene Houston, MD;  Location: [facility];  Service: Endoscopy;  Laterality: N/A;  730 - moved to 5/26 @ 12:00  . COLONOSCOPY N/A 09/26/2014   Procedure: COLONOSCOPY;  Surgeon: Rogene Houston, MD;  Location: [facility];  Service: Endoscopy;  Laterality: N/A;  1020 - moved to 11:15 - [name] to notify pt  . Growth in ear removed     1994  . KNEE SURGERY    . rt knee replacement     2017  . TONSILLECTOMY     age 74    OB History    No data available       Home Medications    Prior to Admission medications   Medication Sig Start Date End Date Taking? Authorizing Provider  acetaminophen (TYLENOL) 500 MG tablet Take 500 mg  by mouth every 6 (six) hours as needed for moderate pain.   Yes [provider]  cholecalciferol (VITAMIN D) 400 UNITS TABS tablet Take 5,000 Units by mouth.   Yes [provider]  latanoprost (XALATAN) 0.005 % ophthalmic solution Place 1 drop into both eyes at bedtime.   Yes [provider]  loperamide (IMODIUM A-D) 2 MG tablet Take 2 mg by mouth 4 (four) times daily as needed for diarrhea or loose stools.   Yes [provider]  metoprolol succinate (TOPROL-XL) 50 MG 24 hr tablet TAKE 1 TABLET 2 TIMES DAILY. TAKE WITH OR IMMEDIATELY FOLLOWING A MEAL. 01/04/17  Yes Satira Sark, MD  spironolactone (ALDACTONE) 25 MG tablet Take 50 mg by mouth 2 (two) times daily.    Yes [provider]    ondansetron (ZOFRAN ODT) 4 MG disintegrating tablet Take 1 tablet (4 mg total) by mouth every 8 (eight) hours as needed for nausea. 03/06/17   Noemi Chapel, MD    Family History History reviewed. No pertinent family history.  Social History Social History   Tobacco Use  . Smoking status: Current Every Day Smoker    Packs/day: 0.50    Types: Cigarettes  . Smokeless tobacco: Never Used  Substance Use Topics  . Alcohol use: No    Alcohol/week: 0.0 oz  . Drug use: No     Allergies   Nsaids; Cortisone; and Demerol [meperidine]   Review of Systems Review of Systems  All other systems reviewed and are negative.    Physical Exam Updated Vital Signs BP (!) 144/85 (BP Location: Left Arm)   Pulse (!) 106   Temp 98 F (36.7 C) (Oral)   Resp 18   Ht 4\' 8"  (1.422 m)   Wt 67.1 kg (148 lb)   SpO2 97%   BMI 33.18 kg/m   Physical Exam  Constitutional: She appears well-developed and well-nourished. No distress.  HENT:  Head: Normocephalic and atraumatic.  Mouth/Throat: No oropharyngeal exudate.  Mucous membranes are dry  Eyes: Conjunctivae and EOM are normal. Pupils are equal, round, and reactive to light. Right eye exhibits no discharge. Left eye exhibits no discharge. No scleral icterus.  Neck: Normal range of motion. Neck supple. No JVD present. No thyromegaly present.  Cardiovascular: Regular rhythm, normal heart sounds and intact distal pulses. Exam reveals no gallop and no friction rub.  No murmur heard. Tachycardic to 110, palpable radial artery pulses, no edema  Pulmonary/Chest: Effort normal. No respiratory distress. She has wheezes ( Expiratory wheezing, prolonged expiratory phase, mild pursed lipped breathing). She has no rales.  Abdominal: Soft. Bowel sounds are normal. She exhibits no distension and no mass. There is tenderness.  Mild diffuse abdominal tenderness without guarding  Musculoskeletal: Normal range of motion. She exhibits no edema or tenderness.   Lymphadenopathy:    She has no cervical adenopathy.  Neurological: She is alert. Coordination normal.  Skin: Skin is warm and dry. No rash noted. No erythema.  Psychiatric: She has a normal mood and affect. Her behavior is normal.  Nursing note and vitals reviewed.    ED Treatments / Results  Labs (all labs ordered are listed, but only abnormal results are displayed) Labs Reviewed  COMPREHENSIVE METABOLIC PANEL - Abnormal; Notable for the following components:      Result Value   Sodium 134 (*)    Potassium 3.3 (*)    CO2 15 (*)    Glucose, Bld 125 (*)    BUN 23 (*)  Creatinine, Ser 1.16 (*)    Calcium 8.4 (*)    AST 70 (*)    GFR calc non Af Amer 47 (*)    GFR calc Af Amer 54 (*)    All other components within normal limits  CBC - Abnormal; Notable for the following components:   RBC 5.69 (*)    Hemoglobin 16.8 (*)    HCT 50.4 (*)    All other components within normal limits  URINALYSIS, ROUTINE W REFLEX MICROSCOPIC - Abnormal; Notable for the following components:   APPearance CLOUDY (*)    Protein, ur 30 (*)    Bacteria, UA RARE (*)    Squamous Epithelial / LPF 6-30 (*)    All other components within normal limits  INFLUENZA PANEL BY PCR (TYPE A & B) - Abnormal; Notable for the following components:   Influenza A By PCR POSITIVE (*)    All other components within normal limits    EKG  EKG Interpretation None       Radiology Dg Chest 2 View  Result Date: 03/06/2017 CLINICAL DATA:  Productive cough and intermittent shortness of breath for the past week associated with fever and body aches. Patient also reports chest wall discomfort. Current smoker. History of bronchitis. EXAM: CHEST  2 VIEW COMPARISON:  Chest x-ray of February 09, 2014 and CT scan of the chest of May 19, 2015. FINDINGS: The lungs are well-expanded. The interstitial markings are coarse diffusely. There are patchy densities in the lingula which are stable as well. There is no alveolar  infiltrate. There is no pleural effusion or pneumothorax. The heart and pulmonary vascularity are normal. There calcification in the wall of the aortic arch. There is moderate levocurvature centered in the midthoracic spine with multilevel degenerative disc disease. IMPRESSION: COPD. Stable scarring in the lingula. No alveolar pneumonia nor CHF. Thoracic aortic atherosclerosis. Electronically Signed   By: David  Martinique M.D.   On: 03/06/2017 13:46    Procedures Procedures (including critical care time)  Medications Ordered in ED Medications  0.9 %  sodium chloride infusion (not administered)  sodium chloride 0.9 % bolus 1,000 mL (0 mLs Intravenous Stopped 03/06/17 1807)  albuterol (PROVENTIL) (2.5 MG/3ML) 0.083% nebulizer solution 5 mg (5 mg Nebulization Given 03/06/17 1517)  ondansetron (ZOFRAN) injection 4 mg (4 mg Intravenous Given 03/06/17 1515)  metoprolol succinate (TOPROL-XL) 24 hr tablet 50 mg (50 mg Oral Given 03/06/17 1807)     Initial Impression / Assessment and Plan / ED Course  I have reviewed the triage vital signs and the nursing notes.  Pertinent labs & imaging results that were available during my care of the patient were reviewed by me and considered in my medical decision making (see chart for details).  Clinical Course as of Mar 07 1823  Mon Mar 06, 2017  1717 Patient states she feels better after Zofran and fluids.  She has been informed of her positive flu result, her urinalysis was negative according to my interpretation.  She will be given an oral dose of her Toprol-XL which she takes for chronic tachycardia which is likely why she is still tachycardic as well.  [BM]  1822 On repeat exam at 6:20 PM the patient is feeling much better, heart rate is down to 95, she is tolerated oral fluids and her metoprolol, she feels like she wants to go home but is aware that she can return if she worsens.  Stable for discharge with Zofran, she is made aware of her results  and the need to  have these followed up by her family doctor including her creatinine.  [BM]    Clinical Course User Index [BM] Noemi Chapel, MD    Results show that the patient is hypokalemic, she has an acute kidney injury though her creatinine is only 1.16 this is double her baseline and her BUN is up to 23 which I suspect is prerenal.  Blood counts with a white blood cell count of 6100, her hemoglobin is high at 16.8 which I suspect is hemoconcentration secondary to dehydration.  Chest x-ray shows COPD with no signs of acute infiltrate.  There is some stable scarring seen.  Overall the patient appears dehydrated and I suspect she has a flulike illness.  Flu sample pending however at this time supportive care with IV fluids and potassium replacement as indicated.  She does not need nephrology consultation nor acute intervention for this acute renal failure which is likely prerenal and needing IV fluids.  She will also need albuterol for her wheezing.  Final Clinical Impressions(s) / ED Diagnoses   Final diagnoses:  Influenza  Dehydration  Vomiting and diarrhea    ED Discharge Orders        Ordered    ondansetron (ZOFRAN ODT) 4 MG disintegrating tablet  Every 8 hours PRN     03/06/17 Jeri Lager       Noemi Chapel, MD 03/06/17 1825

## 2017-03-08 ENCOUNTER — Other Ambulatory Visit: Payer: Self-pay

## 2017-03-08 ENCOUNTER — Encounter (HOSPITAL_COMMUNITY): Payer: Self-pay

## 2017-03-08 ENCOUNTER — Inpatient Hospital Stay (HOSPITAL_COMMUNITY)
Admission: EM | Admit: 2017-03-08 | Discharge: 2017-03-11 | DRG: 445 | Disposition: A | Discharge status: Home / Self Care | Payer: Medicare Other | Attending: Internal Medicine | Admitting: Internal Medicine

## 2017-03-08 ENCOUNTER — Emergency Department (HOSPITAL_COMMUNITY): Payer: Medicare Other

## 2017-03-08 DIAGNOSIS — H409 Unspecified glaucoma: Secondary | ICD-10-CM | POA: Diagnosis present

## 2017-03-08 DIAGNOSIS — E860 Dehydration: Secondary | ICD-10-CM | POA: Diagnosis present

## 2017-03-08 DIAGNOSIS — I10 Essential (primary) hypertension: Secondary | ICD-10-CM | POA: Diagnosis present

## 2017-03-08 DIAGNOSIS — J101 Influenza due to other identified influenza virus with other respiratory manifestations: Secondary | ICD-10-CM | POA: Diagnosis present

## 2017-03-08 DIAGNOSIS — Z888 Allergy status to other drugs, medicaments and biological substances status: Secondary | ICD-10-CM

## 2017-03-08 DIAGNOSIS — E871 Hypo-osmolality and hyponatremia: Secondary | ICD-10-CM | POA: Diagnosis present

## 2017-03-08 DIAGNOSIS — Z96651 Presence of right artificial knee joint: Secondary | ICD-10-CM | POA: Diagnosis present

## 2017-03-08 DIAGNOSIS — R112 Nausea with vomiting, unspecified: Secondary | ICD-10-CM

## 2017-03-08 DIAGNOSIS — R197 Diarrhea, unspecified: Secondary | ICD-10-CM

## 2017-03-08 DIAGNOSIS — Z9889 Other specified postprocedural states: Secondary | ICD-10-CM

## 2017-03-08 DIAGNOSIS — K639 Disease of intestine, unspecified: Secondary | ICD-10-CM | POA: Diagnosis present

## 2017-03-08 DIAGNOSIS — Z79899 Other long term (current) drug therapy: Secondary | ICD-10-CM | POA: Diagnosis not present

## 2017-03-08 DIAGNOSIS — E876 Hypokalemia: Secondary | ICD-10-CM | POA: Diagnosis present

## 2017-03-08 DIAGNOSIS — Z885 Allergy status to narcotic agent status: Secondary | ICD-10-CM | POA: Diagnosis not present

## 2017-03-08 DIAGNOSIS — R740 Nonspecific elevation of levels of transaminase and lactic acid dehydrogenase [LDH]: Secondary | ICD-10-CM | POA: Diagnosis present

## 2017-03-08 DIAGNOSIS — K838 Other specified diseases of biliary tract: Secondary | ICD-10-CM | POA: Diagnosis present

## 2017-03-08 DIAGNOSIS — Z9049 Acquired absence of other specified parts of digestive tract: Secondary | ICD-10-CM | POA: Diagnosis not present

## 2017-03-08 DIAGNOSIS — F17210 Nicotine dependence, cigarettes, uncomplicated: Secondary | ICD-10-CM | POA: Diagnosis present

## 2017-03-08 DIAGNOSIS — K6389 Other specified diseases of intestine: Secondary | ICD-10-CM | POA: Diagnosis not present

## 2017-03-08 DIAGNOSIS — K8050 Calculus of bile duct without cholangitis or cholecystitis without obstruction: Principal | ICD-10-CM | POA: Diagnosis present

## 2017-03-08 DIAGNOSIS — A0472 Enterocolitis due to Clostridium difficile, not specified as recurrent: Secondary | ICD-10-CM | POA: Diagnosis present

## 2017-03-08 DIAGNOSIS — R52 Pain, unspecified: Secondary | ICD-10-CM

## 2017-03-08 DIAGNOSIS — K839 Disease of biliary tract, unspecified: Secondary | ICD-10-CM | POA: Diagnosis not present

## 2017-03-08 DIAGNOSIS — Z886 Allergy status to analgesic agent status: Secondary | ICD-10-CM

## 2017-03-08 LAB — URINALYSIS, ROUTINE W REFLEX MICROSCOPIC
Bilirubin Urine: NEGATIVE
Glucose, UA: NEGATIVE mg/dL
Hgb urine dipstick: NEGATIVE
Ketones, ur: NEGATIVE mg/dL
Leukocytes, UA: NEGATIVE
NITRITE: NEGATIVE
PROTEIN: 30 mg/dL — AB
Specific Gravity, Urine: 1.014 (ref 1.005–1.030)
pH: 5 (ref 5.0–8.0)

## 2017-03-08 LAB — COMPREHENSIVE METABOLIC PANEL
ALBUMIN: 3.6 g/dL (ref 3.5–5.0)
ALK PHOS: 79 U/L (ref 38–126)
ALT: 68 U/L — AB (ref 14–54)
AST: 99 U/L — AB (ref 15–41)
Anion gap: 13 (ref 5–15)
BUN: 10 mg/dL (ref 6–20)
CALCIUM: 8.8 mg/dL — AB (ref 8.9–10.3)
CO2: 21 mmol/L — ABNORMAL LOW (ref 22–32)
CREATININE: 0.81 mg/dL (ref 0.44–1.00)
Chloride: 104 mmol/L (ref 101–111)
GFR calc Af Amer: >60 mL/min (ref >60)
GFR calc non Af Amer: >60 mL/min (ref >60)
GLUCOSE: 108 mg/dL — AB (ref 65–99)
POTASSIUM: 3.5 mmol/L (ref 3.5–5.1)
Sodium: 138 mmol/L (ref 135–145)
TOTAL PROTEIN: 7.6 g/dL (ref 6.5–8.1)
Total Bilirubin: 0.4 mg/dL (ref 0.3–1.2)

## 2017-03-08 LAB — C DIFFICILE QUICK SCREEN W PCR REFLEX
C DIFFICLE (CDIFF) ANTIGEN: POSITIVE — AB
C Diff toxin: NEGATIVE

## 2017-03-08 LAB — CBC
HEMATOCRIT: 45.9 % (ref 36.0–46.0)
Hemoglobin: 15.6 g/dL — ABNORMAL HIGH (ref 12.0–15.0)
MCH: 29.7 pg (ref 26.0–34.0)
MCHC: 34 g/dL (ref 30.0–36.0)
MCV: 87.4 fL (ref 78.0–100.0)
Platelets: 341 10*3/uL (ref 150–400)
RBC: 5.25 MIL/uL — ABNORMAL HIGH (ref 3.87–5.11)
RDW: 14 % (ref 11.5–15.5)
WBC: 7.8 10*3/uL (ref 4.0–10.5)

## 2017-03-08 LAB — LIPASE, BLOOD: Lipase: 30 U/L (ref 11–51)

## 2017-03-08 MED ORDER — VANCOMYCIN HCL 250 MG PO CAPS: 250.0000 mg | ORAL_CAPSULE | Freq: Four times a day (QID) | ORAL | 0 refills | Status: DC

## 2017-03-08 MED ORDER — ACETAMINOPHEN 325 MG PO TABS
650.0000 mg | ORAL_TABLET | Freq: Four times a day (QID) | ORAL | Status: DC | PRN
  Administered 2017-03-11: 650 mg via ORAL
  Filled 2017-03-08: qty 2

## 2017-03-08 MED ORDER — IOPAMIDOL (ISOVUE-300) INJECTION 61%
100.0000 mL | Freq: Once | INTRAVENOUS | Status: AC | PRN
  Administered 2017-03-08: 100 mL via INTRAVENOUS

## 2017-03-08 MED ORDER — VANCOMYCIN 50 MG/ML ORAL SOLUTION: 125.0000 mg | Freq: Four times a day (QID) | ORAL | Status: DC

## 2017-03-08 MED ORDER — PROMETHAZINE HCL 12.5 MG PO TABS: 12.5000 mg | ORAL_TABLET | Freq: Four times a day (QID) | ORAL | 0 refills | Status: DC | PRN

## 2017-03-08 MED ORDER — ONDANSETRON HCL 4 MG/2ML IJ SOLN
4.0000 mg | Freq: Four times a day (QID) | INTRAMUSCULAR | Status: DC | PRN
  Administered 2017-03-10: 4 mg via INTRAVENOUS
  Filled 2017-03-08: qty 2

## 2017-03-08 MED ORDER — LOPERAMIDE HCL 2 MG PO TABS
2.0000 mg | ORAL_TABLET | Freq: Four times a day (QID) | ORAL | Status: DC | PRN
  Filled 2017-03-08: qty 1

## 2017-03-08 MED ORDER — ENOXAPARIN SODIUM 40 MG/0.4ML ~~LOC~~ SOLN: 40.0000 mg | SUBCUTANEOUS | Status: DC

## 2017-03-08 MED ORDER — PROMETHAZINE HCL 25 MG/ML IJ SOLN
12.5000 mg | Freq: Once | INTRAMUSCULAR | Status: AC
  Administered 2017-03-08: 12.5 mg via INTRAVENOUS
  Filled 2017-03-08: qty 1

## 2017-03-08 MED ORDER — LOPERAMIDE HCL 2 MG PO CAPS: 2.0000 mg | ORAL_CAPSULE | Freq: Four times a day (QID) | ORAL | Status: DC | PRN

## 2017-03-08 MED ORDER — METOPROLOL SUCCINATE ER 50 MG PO TB24
50.0000 mg | ORAL_TABLET | Freq: Every day | ORAL | Status: DC
  Administered 2017-03-09 – 2017-03-11 (×3): 50 mg via ORAL
  Filled 2017-03-08 (×3): qty 1

## 2017-03-08 MED ORDER — LATANOPROST 0.005 % OP SOLN
1.0000 [drp] | Freq: Every day | OPHTHALMIC | Status: DC
  Administered 2017-03-09 – 2017-03-10 (×3): 1 [drp] via OPHTHALMIC
  Filled 2017-03-08 (×3): qty 2.5

## 2017-03-08 MED ORDER — POTASSIUM CHLORIDE IN NACL 20-0.9 MEQ/L-% IV SOLN
INTRAVENOUS | Status: AC
  Administered 2017-03-09: 01:00:00 via INTRAVENOUS

## 2017-03-08 MED ORDER — SODIUM CHLORIDE 0.9 % IV BOLUS (SEPSIS)
1000.0000 mL | Freq: Once | INTRAVENOUS | Status: AC
  Administered 2017-03-08: 1000 mL via INTRAVENOUS

## 2017-03-08 MED ORDER — ACETAMINOPHEN 650 MG RE SUPP: 650.0000 mg | Freq: Four times a day (QID) | RECTAL | Status: DC | PRN

## 2017-03-08 MED ORDER — VANCOMYCIN 50 MG/ML ORAL SOLUTION
125.0000 mg | Freq: Four times a day (QID) | ORAL | Status: DC
  Administered 2017-03-08 – 2017-03-11 (×10): 125 mg via ORAL
  Filled 2017-03-08 (×23): qty 2.5

## 2017-03-08 NOTE — ED Notes (Signed)
AC to bring Vancomycin. 

## 2017-03-08 NOTE — ED Triage Notes (Signed)
Patient was here dx with the flu Monday. States she has had had vomiting and diarrhea since. Unable to keep fluids down. Took imodium yesterday with some relief.

## 2017-03-08 NOTE — Discharge Instructions (Signed)
You will likely have some ongoing diarrhea for the next week however it should gradually get better You should notice improved nausea while taking Phenergan, 12.5 mg every 6 hours as needed for nausea, be aware that this can be sedating and make you sleepy Please try to drink fluids including meal replacement shakes like Ensure Return to the emergency department for increasing pain fevers vomiting or diarrhea

## 2017-03-08 NOTE — H&P (Addendum)
[facility] H&P   Patient Demographics:    Kathryn Meyers, is a 72 y.o. female  MRN: [id number]   DOB - Jan 13, 1946  Admit Date - 03/08/2017  Outpatient Primary MD for the patient is Scotty Court Zella Richer., MD  Referring MD/NP/PA:   Noemi Chapel   Outpatient Specialists:     Patient coming from: home  Chief Complaint  Patient presents with  . Emesis      HPI:    Kathryn Meyers  is a 72 y.o. female, w recent diagnosis of influenza A, didn't take tamiflu .  Pt has been having  Diarrhea since last Friday. Some slight nausea/  Pt has had C. Diff in the past and taken flagyl but it made her sick.  Pt presented to ED due to diarrhea.    In ED,  CT scan abd/ pelvis IMPRESSION: Abnormal intrahepatic and extrahepatic biliary ductal dilatation associated with a suspected enhancing lesion in the ampulla; concern for partially obstructing neoplasm. Consider MRCP or ERCP for further evaluation.  Suspected mild colitis in the cecum/ascending colon. No bowel obstruction.  CXR IMPRESSION: Chronic smoking related bronchitic change of the lungs.  Wbc 7.8, Hgb 15.6, Plt 341 Na 138, K 3.5,  Bun 10, Creatinine 0.81 Ast 99, Alt 68, Alk phos 79, T. Bili 0.4 Lipase 30,    Urinalysis negative  C. Diff positive  Pt will be admitted for C. Diff colitis, and concern for ampullary neoplasm.      Review of systems:    In addition to the HPI above, No Fever-chills, No Headache, No changes with Vision or hearing, No problems swallowing food or Liquids, No Chest pain, Cough or Shortness of Breath, No Abdominal pain, No Blood in stool or Urine, No dysuria, No new skin rashes or bruises, No new joints pains-aches,  No new weakness, tingling, numbness in any extremity, No recent weight gain or loss, No polyuria, polydypsia or polyphagia, No significant Mental Stressors.  A full 10 point  Review of Systems was done, except as stated above, all other Review of Systems were negative.   With Past History of the following :    Past Medical History:  Diagnosis Date  . Abnormal EKG   . Glaucoma   . 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 Palpitations   . Shingles   . Sinus infection   . Sinus tachycardia    Mild persistent sinus tachycardia 2009 - on beta blocker      Past Surgical History:  Procedure Laterality Date  . CATARACT EXTRACTION    . CESAREAN SECTION     x 4  . CHOLECYSTECTOMY  1982   cholecystitis  . COLONOSCOPY N/A 06/19/2014   Procedure: COLONOSCOPY;  Surgeon: Rogene Houston, MD;  Location:  [facility];  Service: Endoscopy;  Laterality: N/A;  730 - moved to 5/26 @ 12:00  . COLONOSCOPY N/A 09/26/2014   Procedure: COLONOSCOPY;  Surgeon: Rogene Houston, MD;  Location: [facility];  Service: Endoscopy;  Laterality: N/A;  1020 - moved to 11:15 - [name] to notify pt  . Growth in ear removed     1994  . KNEE SURGERY    . rt knee replacement     2017  . TONSILLECTOMY     age 47      Social History:     Social History   Tobacco Use  . Smoking status: Current Every Day Smoker    Packs/day: 0.50    Types: Cigarettes  . Smokeless tobacco: Never Used  Substance Use Topics  . Alcohol use: No    Alcohol/week: 0.0 oz     Lives - at home  Mobility - walks by self   Family History :     Family History  Problem Relation Age of Onset  . Dementia Mother   . Alcoholism Father       Home Medications:   Prior to Admission medications   Medication Sig Start Date End Date Taking? Authorizing Provider  acetaminophen (TYLENOL) 500 MG tablet Take 500 mg by mouth every 6 (six) hours as needed for moderate pain.   Yes [provider]  Cholecalciferol 5000 units capsule Take 5,000 Units by mouth  daily.    Yes [provider]  latanoprost (XALATAN) 0.005 % ophthalmic solution Place 1 drop into both eyes at bedtime.   Yes [provider]  loperamide (IMODIUM A-D) 2 MG tablet Take 2 mg by mouth 4 (four) times daily as needed for diarrhea or loose stools.   Yes [provider]  metoprolol succinate (TOPROL-XL) 50 MG 24 hr tablet TAKE 1 TABLET 2 TIMES DAILY. TAKE WITH OR IMMEDIATELY FOLLOWING A MEAL. 01/04/17  Yes Satira Sark, MD  ondansetron (ZOFRAN ODT) 4 MG disintegrating tablet Take 1 tablet (4 mg total) by mouth every 8 (eight) hours as needed for nausea. 03/06/17  Yes Noemi Chapel, MD  spironolactone (ALDACTONE) 25 MG tablet Take 50 mg by mouth 2 (two) times daily.    Yes [provider]  promethazine (PHENERGAN) 12.5 MG tablet Take 1 tablet (12.5 mg total) by mouth every 6 (six) hours as needed for nausea or vomiting. 03/08/17   Noemi Chapel, MD  vancomycin (VANCOCIN) 250 MG capsule Take 1 capsule (250 mg total) by mouth 4 (four) times daily for 10 days. 03/08/17 03/18/17  Noemi Chapel, MD     Allergies:     Allergies  Allergen Reactions  . Nsaids Other (See Comments)    Rectal bleeding.  . Cortisone Other (See Comments)    Dizzy/flush/rapid heart beat.  . Demerol [Meperidine] Nausea And Vomiting     Physical Exam:   Vitals  Blood pressure 135/63, pulse (!) 52, temperature 98 F (36.7 C), temperature source Oral, resp. rate 18, height '4\' 8"'  (1.422 m), weight 67.1 kg (148 lb), SpO2 95 %.   1. General  lying in bed in NAD,  2. Normal affect and insight, Not Suicidal or Homicidal, Awake Alert, Oriented X 3.  3. No F.N deficits, ALL C.Nerves Intact, Strength 5/5 all 4 extremities, Sensation intact all 4 extremities, Plantars down going.  4. Ears and Eyes appear Normal, Conjunctivae clear, PERRLA. Moist Oral Mucosa.  5. Supple Neck, No JVD, No cervical lymphadenopathy appriciated, No Carotid Bruits.  6. Symmetrical Chest wall  movement, Good air movement bilaterally, CTAB.  7. RRR, No Gallops, Rubs or Murmurs, No Parasternal Heave.  8. Positive Bowel Sounds, Abdomen Soft, No tenderness, No organomegaly appriciated,No rebound -guarding or rigidity.  9.  No Cyanosis, Normal Skin Turgor, No Skin Rash or Bruise.  10. Good muscle tone,  joints appear normal , no effusions, Normal ROM.  11. No Palpable Lymph Nodes in Neck or Axillae    Data Review:    CBC Recent Labs  Lab 03/06/17 1252 03/08/17 1159  WBC 6.1 7.8  HGB 16.8* 15.6*  HCT 50.4* 45.9  PLT 276 341  MCV 88.6 87.4  MCH 29.5 29.7  MCHC 33.3 34.0  RDW 13.9 14.0   ------------------------------------------------------------------------------------------------------------------  Chemistries  Recent Labs  Lab 03/06/17 1252 03/08/17 1159  NA 134* 138  K 3.3* 3.5  CL 105 104  CO2 15* 21*  GLUCOSE 125* 108*  BUN 23* 10  CREATININE 1.16* 0.81  CALCIUM 8.4* 8.8*  AST 70* 99*  ALT 46 68*  ALKPHOS 91 79  BILITOT 0.3 0.4   ------------------------------------------------------------------------------------------------------------------ estimated creatinine clearance is 50.3 mL/min (by C-G formula based on SCr of 0.81 mg/dL). ------------------------------------------------------------------------------------------------------------------ No results for input(s): TSH, T4TOTAL, T3FREE, THYROIDAB in the last 72 hours.  Invalid input(s): FREET3  Coagulation profile No results for input(s): INR, PROTIME in the last 168 hours. ------------------------------------------------------------------------------------------------------------------- No results for input(s): DDIMER in the last 72 hours. -------------------------------------------------------------------------------------------------------------------  Cardiac Enzymes No results for input(s): CKMB, TROPONINI, MYOGLOBIN in the last 168 hours.  Invalid input(s):  CK ------------------------------------------------------------------------------------------------------------------ No results found for: BNP   ---------------------------------------------------------------------------------------------------------------  Urinalysis    Component Value Date/Time   COLORURINE YELLOW 03/08/2017 1027   APPEARANCEUR HAZY (A) 03/08/2017 1027   LABSPEC 1.014 03/08/2017 1027   PHURINE 5.0 03/08/2017 1027   GLUCOSEU NEGATIVE 03/08/2017 1027   HGBUR NEGATIVE 03/08/2017 1027   BILIRUBINUR NEGATIVE 03/08/2017 1027   KETONESUR NEGATIVE 03/08/2017 1027   PROTEINUR 30 (A) 03/08/2017 1027   UROBILINOGEN 0.2 07/22/2010 1314   NITRITE NEGATIVE 03/08/2017 1027   LEUKOCYTESUR NEGATIVE 03/08/2017 1027    ----------------------------------------------------------------------------------------------------------------   Imaging Results:    Dg Chest 2 View  Result Date: 03/08/2017 CLINICAL DATA:  Dyspnea x2 years. Diagnosed with flu on Monday. Diarrhea and vomiting. Everyday smoker. EXAM: CHEST  2 VIEW COMPARISON:  03/06/2017 FINDINGS: The heart size and mediastinal contours are within normal limits. Aortic atherosclerosis along the transverse portion without aneurysmal dilatation. Chronic bilateral interstitial prominence consistent with smoking related bronchitic change. No alveolar consolidation or CHF. No effusion or pneumothorax. The visualized skeletal structures are nonacute. IMPRESSION: Chronic smoking related bronchitic change of the lungs. Electronically Signed   By: Ashley Royalty M.D.   On: 03/08/2017 18:31   Ct Abdomen Pelvis W Contrast  Result Date: 03/08/2017 CLINICAL DATA:  Diagnosis of flu.  Vomiting. EXAM: CT ABDOMEN AND PELVIS WITH CONTRAST TECHNIQUE: Multidetector CT imaging of the abdomen and pelvis was performed using the standard protocol following bolus administration of intravenous contrast. CONTRAST:  158m ISOVUE-300 IOPAMIDOL (ISOVUE-300)  INJECTION 61% COMPARISON:  None. FINDINGS: Lower chest: No acute abnormality. Hepatobiliary: Few simple cysts are seen, unchanged from prior visualization on CT. No visible gallstones. There is intrahepatic and extrahepatic biliary ductal dilatation, with common bile duct measuring 13 mm. There is an enhancing lesion at the ampulla, roughly 1.5 cm in diameter, projecting into the duodenum, concern for partially obstructing neoplasm. Consider MRCP or ERCP for further evaluation. See series 2, image 33, series  4, image 44. Pancreas: Unremarkable. No pancreatic ductal dilatation or surrounding inflammatory changes. Spleen: Normal in size without focal abnormality. Adrenals/Urinary Tract: Adrenal glands are unremarkable. Kidneys are normal, without renal calculi, focal lesion, or hydronephrosis. Bladder is unremarkable. Stomach/Bowel: Although motion degraded, there is fluid in some wall thickening in the cecum and ascending colon consistent with enteritis. No bowel obstruction. No localized perforation. No appendiceal inflammation. Vascular/Lymphatic: Aortic atherosclerosis. No enlarged lymph nodes. Reproductive: Uterus and bilateral adnexa are unremarkable. Other: No abdominal wall hernia or abnormality. No abdominopelvic ascites. Musculoskeletal: No acute or significant osseous findings. IMPRESSION: Abnormal intrahepatic and extrahepatic biliary ductal dilatation associated with a suspected enhancing lesion in the ampulla; concern for partially obstructing neoplasm. Consider MRCP or ERCP for further evaluation. Suspected mild colitis in the cecum/ascending colon. No bowel obstruction. Electronically Signed   By: Staci Righter M.D.   On: 03/08/2017 18:33       Assessment & Plan:    Principal Problem:   Diarrhea Active Problems:   Hypokalemia   Hyponatremia    C diff diarrhea Sick from flagyl in the past.  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 Dehydration)  Glaucoma Cont Xalatan   DVT Prophylaxis   Lovenox - SCDs  AM Labs Ordered, also please review Full Orders  Family Communication: Admission, patients condition and plan of care including tests being ordered have been discussed with the patient  who indicate understanding and agree with the plan and Code Status.  Code Status FULL CODE  Likely DC to  home  Condition GUARDED    Consults called: none  Admission status: inpatient   Time spent in minutes :45    JJani GravelM.D on 03/08/2017 at 10:29 PM  Between 7am to 7pm - Pager - [phone] After 7pm go to [email or web address] - password [email or web address] - Office  [phone]

## 2017-03-08 NOTE — ED Provider Notes (Addendum)
[facility] Provider Note   CSN: [id number] Arrival date & time: 03/08/17  1018     History   Chief Complaint Chief Complaint  Patient presents with  . Emesis    HPI Kathryn Meyers is a 72 y.o. female.  HPI  The patient is a 72 year old female who I saw 2 days ago for similar symptoms.  She was diagnosed with a positive flu sample after having an respiratory illness followed by gastrointestinal symptoms of vomiting and diarrhea.  Since going home 2 days ago after improving in the emergency department she has had some ongoing nausea vomiting and diarrhea which she states is not improving with Zofran.  She has approximately 3 or 4 watery bowel movements a day, she states that when she sits down to use the bathroom to urinate she has the diarrhea with it.  She has been trying to take oral fluids and Gatorade but states that when she tries to do this she vomits it back up.  She has some abdominal discomfort, she has ongoing coughing with a rattling in her chest though she states it is getting better and she denies fevers or chills.  Past Medical History:  Diagnosis Date  . Abnormal EKG   . Glaucoma   . 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 Palpitations   . Shingles   . Sinus infection   . Sinus tachycardia    Mild persistent sinus tachycardia 2009 - on beta blocker    Patient Active Problem List   Diagnosis Date Noted  . Lung nodules 09/24/2014  . Guaiac positive stools 05/01/2014  . Diarrhea 05/01/2014  . Hypokalemia 03/12/2014  . Vertigo 12/23/2013  . Dyspnea on exertion 12/23/2013  . Ejection fraction   . Nail patellar syndrome   . Abnormal EKG   . Palpitations   . Sinus infection   . Sinus tachycardia   . Tobacco abuse     Past Surgical History:  Procedure Laterality Date  . CESAREAN SECTION       x 4  . CHOLECYSTECTOMY  1982   cholecystitis  . COLONOSCOPY N/A 06/19/2014   Procedure: COLONOSCOPY;  Surgeon: Rogene Houston, MD;  Location: [facility];  Service: Endoscopy;  Laterality: N/A;  730 - moved to 5/26 @ 12:00  . COLONOSCOPY N/A 09/26/2014   Procedure: COLONOSCOPY;  Surgeon: Rogene Houston, MD;  Location: [facility];  Service: Endoscopy;  Laterality: N/A;  1020 - moved to 11:15 - [name] to notify pt  . Growth in ear removed     1994  . KNEE SURGERY    . rt knee replacement     2017  . TONSILLECTOMY     age 88    OB History    No data available       Home Medications    Prior to Admission medications   Medication Sig Start Date End Date Taking? Authorizing Provider  acetaminophen (TYLENOL) 500 MG tablet Take 500 mg by mouth every 6 (six) hours as needed for moderate pain.   Yes [provider]  Cholecalciferol 5000 units capsule Take 5,000 Units by mouth daily.    Yes [provider]  latanoprost (XALATAN) 0.005 % ophthalmic solution Place 1 drop into both eyes at bedtime.   Yes [provider]  loperamide (IMODIUM A-D) 2 MG tablet  Take 2 mg by mouth 4 (four) times daily as needed for diarrhea or loose stools.   Yes [provider]  metoprolol succinate (TOPROL-XL) 50 MG 24 hr tablet TAKE 1 TABLET 2 TIMES DAILY. TAKE WITH OR IMMEDIATELY FOLLOWING A MEAL. 01/04/17  Yes Satira Sark, MD  ondansetron (ZOFRAN ODT) 4 MG disintegrating tablet Take 1 tablet (4 mg total) by mouth every 8 (eight) hours as needed for nausea. 03/06/17  Yes Noemi Chapel, MD  spironolactone (ALDACTONE) 25 MG tablet Take 50 mg by mouth 2 (two) times daily.    Yes [provider]  promethazine (PHENERGAN) 12.5 MG tablet Take 1 tablet (12.5 mg total) by mouth every 6 (six) hours as needed for nausea or vomiting. 03/08/17   Noemi Chapel, MD  vancomycin (VANCOCIN) 250 MG capsule Take 1 capsule (250 mg total) by mouth 4 (four) times daily for 10 days.  03/08/17 03/18/17  Noemi Chapel, MD    Family History No family history on file.  Social History Social History   Tobacco Use  . Smoking status: Current Every Day Smoker    Packs/day: 0.50    Types: Cigarettes  . Smokeless tobacco: Never Used  Substance Use Topics  . Alcohol use: No    Alcohol/week: 0.0 oz  . Drug use: No     Allergies   Nsaids; Cortisone; and Demerol [meperidine]   Review of Systems Review of Systems  All other systems reviewed and are negative.    Physical Exam Updated Vital Signs BP (!) 153/78 (BP Location: Left Arm)   Pulse 99   Temp 98 F (36.7 C) (Oral)   Resp 18   Ht 4\' 8"  (1.422 m)   Wt 67.1 kg (148 lb)   SpO2 96%   BMI 33.18 kg/m   Physical Exam  Constitutional: She appears well-developed and well-nourished. No distress.  HENT:  Head: Normocephalic and atraumatic.  Mouth/Throat: No oropharyngeal exudate.  The mucous membranes are dry in appearance  Eyes: Conjunctivae and EOM are normal. Pupils are equal, round, and reactive to light. Right eye exhibits no discharge. Left eye exhibits no discharge. No scleral icterus.  Neck: Normal range of motion. Neck supple. No JVD present. No thyromegaly present.  Cardiovascular: Normal rate, regular rhythm, normal heart sounds and intact distal pulses. Exam reveals no gallop and no friction rub.  No murmur heard. Pulmonary/Chest: Effort normal and breath sounds normal. No respiratory distress. She has no wheezes. She has no rales.  Abdominal: Soft. Bowel sounds are normal. She exhibits no distension and no mass. There is tenderness ( Tender to palpation over the left lower anterior mid axillary line to the midclavicular line on the lowest rib).  There is no abdominal tenderness to palpation, bowel sounds are normal  Musculoskeletal: Normal range of motion. She exhibits no edema or tenderness.  Lymphadenopathy:    She has no cervical adenopathy.  Neurological: She is alert. Coordination normal.    Skin: Skin is warm and dry. No rash noted. No erythema.  Psychiatric: She has a normal mood and affect. Her behavior is normal.  Nursing note and vitals reviewed.    ED Treatments / Results  Labs (all labs ordered are listed, but only abnormal results are displayed) Labs Reviewed  C DIFFICILE QUICK SCREEN W PCR REFLEX - Abnormal; Notable for the following components:      Result Value   C Diff antigen POSITIVE (*)    All other components within normal limits  COMPREHENSIVE METABOLIC PANEL -  Abnormal; Notable for the following components:   CO2 21 (*)    Glucose, Bld 108 (*)    Calcium 8.8 (*)    AST 99 (*)    ALT 68 (*)    All other components within normal limits  CBC - Abnormal; Notable for the following components:   RBC 5.25 (*)    Hemoglobin 15.6 (*)    All other components within normal limits  URINALYSIS, ROUTINE W REFLEX MICROSCOPIC - Abnormal; Notable for the following components:   APPearance HAZY (*)    Protein, ur 30 (*)    Bacteria, UA RARE (*)    Squamous Epithelial / LPF 0-5 (*)    All other components within normal limits  CLOSTRIDIUM DIFFICILE BY PCR, REFLEXED  LIPASE, BLOOD     Radiology Dg Chest 2 View  Result Date: 03/08/2017 CLINICAL DATA:  Dyspnea x2 years. Diagnosed with flu on Monday. Diarrhea and vomiting. Everyday smoker. EXAM: CHEST  2 VIEW COMPARISON:  03/06/2017 FINDINGS: The heart size and mediastinal contours are within normal limits. Aortic atherosclerosis along the transverse portion without aneurysmal dilatation. Chronic bilateral interstitial prominence consistent with smoking related bronchitic change. No alveolar consolidation or CHF. No effusion or pneumothorax. The visualized skeletal structures are nonacute. IMPRESSION: Chronic smoking related bronchitic change of the lungs. Electronically Signed   By: Ashley Royalty M.D.   On: 03/08/2017 18:31   Ct Abdomen Pelvis W Contrast  Result Date: 03/08/2017 CLINICAL DATA:  Diagnosis of flu.   Vomiting. EXAM: CT ABDOMEN AND PELVIS WITH CONTRAST TECHNIQUE: Multidetector CT imaging of the abdomen and pelvis was performed using the standard protocol following bolus administration of intravenous contrast. CONTRAST:  114mL ISOVUE-300 IOPAMIDOL (ISOVUE-300) INJECTION 61% COMPARISON:  None. FINDINGS: Lower chest: No acute abnormality. Hepatobiliary: Few simple cysts are seen, unchanged from prior visualization on CT. No visible gallstones. There is intrahepatic and extrahepatic biliary ductal dilatation, with common bile duct measuring 13 mm. There is an enhancing lesion at the ampulla, roughly 1.5 cm in diameter, projecting into the duodenum, concern for partially obstructing neoplasm. Consider MRCP or ERCP for further evaluation. See series 2, image 33, series 4, image 44. Pancreas: Unremarkable. No pancreatic ductal dilatation or surrounding inflammatory changes. Spleen: Normal in size without focal abnormality. Adrenals/Urinary Tract: Adrenal glands are unremarkable. Kidneys are normal, without renal calculi, focal lesion, or hydronephrosis. Bladder is unremarkable. Stomach/Bowel: Although motion degraded, there is fluid in some wall thickening in the cecum and ascending colon consistent with enteritis. No bowel obstruction. No localized perforation. No appendiceal inflammation. Vascular/Lymphatic: Aortic atherosclerosis. No enlarged lymph nodes. Reproductive: Uterus and bilateral adnexa are unremarkable. Other: No abdominal wall hernia or abnormality. No abdominopelvic ascites. Musculoskeletal: No acute or significant osseous findings. IMPRESSION: Abnormal intrahepatic and extrahepatic biliary ductal dilatation associated with a suspected enhancing lesion in the ampulla; concern for partially obstructing neoplasm. Consider MRCP or ERCP for further evaluation. Suspected mild colitis in the cecum/ascending colon. No bowel obstruction. Electronically Signed   By: Staci Righter M.D.   On: 03/08/2017 18:33     Procedures Procedures (including critical care time)  Medications Ordered in ED Medications  vancomycin (VANCOCIN) 50 mg/mL oral solution 125 mg (125 mg Oral Given 03/08/17 2026)  sodium chloride 0.9 % bolus 1,000 mL (1,000 mLs Intravenous New Bag/Given 03/08/17 1743)  promethazine (PHENERGAN) injection 12.5 mg (12.5 mg Intravenous Given 03/08/17 1743)  iopamidol (ISOVUE-300) 61 % injection 100 mL (100 mLs Intravenous Contrast Given 03/08/17 1802)     Initial Impression /  Assessment and Plan / ED Course  I have reviewed the triage vital signs and the nursing notes.  Pertinent labs & imaging results that were available during my care of the patient were reviewed by me and considered in my medical decision making (see chart for details).  Clinical Course as of Mar 09 2111  Wed Mar 08, 2017  1940 C. difficile test positive, patient will be started on oral vancomycin C Diff interpretation: Results are indeterminate. See PCR results. [BM]    Clinical Course User Index [BM] Noemi Chapel, MD    The patient actually looks better today, though she still looks dehydrated she is no longer tachypneic, she has no abdominal tenderness but has had some persistent diarrhea.  She is not tolerating fluids very well.  All this being said her labs are rather unremarkable with a normal BUN, creatinine, electrolyte panel.  Blood counts with no leukocytosis.  Will obtain a C. difficile, urinalysis, IV fluids and symptomatic control.  Chest x-ray to rule out developing pneumonia and a post influenza patient  Discussed case with Dr. Maudie Mercury - will admit CT reviewed and shows possible obstructing mass at the ampulla with some biliary dilatation C dif positive Vanc given.  Final Clinical Impressions(s) / ED Diagnoses   Final diagnoses:  C. difficile diarrhea  Non-intractable vomiting with nausea, unspecified vomiting type  Intestinal mass      Noemi Chapel, MD 03/08/17 2113    Noemi Chapel,  MD 03/08/17 2118

## 2017-03-08 NOTE — ED Notes (Signed)
Report given to Jennifer, RN

## 2017-03-09 ENCOUNTER — Inpatient Hospital Stay (HOSPITAL_COMMUNITY): Payer: Medicare Other

## 2017-03-09 DIAGNOSIS — K839 Disease of biliary tract, unspecified: Secondary | ICD-10-CM

## 2017-03-09 DIAGNOSIS — E876 Hypokalemia: Secondary | ICD-10-CM

## 2017-03-09 DIAGNOSIS — A0472 Enterocolitis due to Clostridium difficile, not specified as recurrent: Secondary | ICD-10-CM

## 2017-03-09 DIAGNOSIS — K8050 Calculus of bile duct without cholangitis or cholecystitis without obstruction: Principal | ICD-10-CM

## 2017-03-09 DIAGNOSIS — K838 Other specified diseases of biliary tract: Secondary | ICD-10-CM

## 2017-03-09 LAB — COMPREHENSIVE METABOLIC PANEL
ALBUMIN: 3 g/dL — AB (ref 3.5–5.0)
ALK PHOS: 64 U/L (ref 38–126)
ALT: 60 U/L — ABNORMAL HIGH (ref 14–54)
AST: 74 U/L — AB (ref 15–41)
Anion gap: 11 (ref 5–15)
BILIRUBIN TOTAL: 0.7 mg/dL (ref 0.3–1.2)
BUN: 9 mg/dL (ref 6–20)
CALCIUM: 8.2 mg/dL — AB (ref 8.9–10.3)
CO2: 20 mmol/L — ABNORMAL LOW (ref 22–32)
Chloride: 109 mmol/L (ref 101–111)
Creatinine, Ser: 0.73 mg/dL (ref 0.44–1.00)
GFR calc Af Amer: >60 mL/min (ref >60)
GLUCOSE: 85 mg/dL (ref 65–99)
Potassium: 3.3 mmol/L — ABNORMAL LOW (ref 3.5–5.1)
Sodium: 140 mmol/L (ref 135–145)
TOTAL PROTEIN: 6.6 g/dL (ref 6.5–8.1)

## 2017-03-09 LAB — CBC
HCT: 42.6 % (ref 36.0–46.0)
Hemoglobin: 14.1 g/dL (ref 12.0–15.0)
MCH: 29.2 pg (ref 26.0–34.0)
MCHC: 33.1 g/dL (ref 30.0–36.0)
MCV: 88.2 fL (ref 78.0–100.0)
PLATELETS: 329 10*3/uL (ref 150–400)
RBC: 4.83 MIL/uL (ref 3.87–5.11)
RDW: 14.2 % (ref 11.5–15.5)
WBC: 7.2 10*3/uL (ref 4.0–10.5)

## 2017-03-09 LAB — CLOSTRIDIUM DIFFICILE BY PCR, REFLEXED: CDIFFPCR: POSITIVE — AB

## 2017-03-09 MED ORDER — GADOBENATE DIMEGLUMINE 529 MG/ML IV SOLN
15.0000 mL | Freq: Once | INTRAVENOUS | Status: AC | PRN
  Administered 2017-03-09: 13 mL via INTRAVENOUS

## 2017-03-09 MED ORDER — LOPERAMIDE HCL 1 MG/5ML PO LIQD
2.0000 mg | Freq: Four times a day (QID) | ORAL | Status: DC | PRN
  Filled 2017-03-09: qty 10

## 2017-03-09 NOTE — Progress Notes (Signed)
PROGRESS NOTE                                                                                                                                                                                                             Patient Demographics:    Kathryn Meyers, is a 72 y.o. female, DOB - 1945/09/11, OMB:[id number]  Admit date - 03/08/2017   Admitting Physician Jani Gravel, MD  Outpatient Primary MD for the patient is Deloria Lair., MD  LOS - 1  Outpatient Specialists:    Chief Complaint  Patient presents with  . Emesis       Brief Narrative 72 year old female with history of recently diagnosed influenza A but did not take Tamiflu presented with diarrhea since last 5-6 days with associated nausea.  Patient has history of C. difficile in the past (took Flagyl which made her sick).  Patient denies any fevers, chills, vomiting, has mild lower abdominal pain.  Labs showed mild transaminitis with normal bilirubin.  CT of the abdomen showed abnormal intra-and extrahepatic biliary ductal dilatation with questionable enhancing lesion in the ampulla concerning for a partially obstructing neoplasm.  Also had mild colitis in the cecum/ascending colon.    Subjective:   Patient reports having watery diarrhea this morning (almost 4 episodes since yesterday.)  Reports having some nausea but no further abdominal pain.   Assessment  & Plan :    Principal Problem: C. difficile colitis ([place]) Continue oral vancomycin.  IV hydration with normal saline.  Enteric precautions.  Active Problems: Elevated transaminases with abnormal CT scan Intra-and extrahepatic biliary ductal dilatation with concerning lesion in the ampulla.  MRCP shows intrahepatic and CBD dilatation with an impacted stone in the CBD measuring 1.3X 0.6 cm.  There is filling defect at the ampulla which could be due to edema from inflammation with impacted stone versus  ampullary neoplasm.  Will need ERCP. Patient denies any weight loss and does not have further abdominal pain.  GI consulted.  Recent influenza A Did not take Tamiflu.  Reports her URI symptoms to have improved.  Hypokalemia Replenished   Code Status : Full code  Family Communication  : Husband at bedside  Disposition Plan  : Home once improved, possibly in the next 1-2 days  Barriers For Discharge : Active symptoms  Consults  : GI  Procedures  :  CT abdomen MRCP  DVT Prophylaxis  :  Lovenox -   Lab Results  Component Value Date   PLT 329 03/09/2017    Antibiotics  :    Anti-infectives (From admission, onward)   Start     Dose/Rate Route Frequency Ordered Stop   03/09/17 0000  vancomycin (VANCOCIN) 50 mg/mL oral solution 125 mg  Status:  Discontinued     125 mg Oral Every 6 hours 03/08/17 2247 03/08/17 2253   03/08/17 2000  vancomycin (VANCOCIN) 50 mg/mL oral solution 125 mg     125 mg Oral Every 6 hours 03/08/17 1942     03/08/17 0000  vancomycin (VANCOCIN) 250 MG capsule     250 mg Oral 4 times daily 03/08/17 2100 03/18/17 2359        Objective:   Vitals:   03/08/17 2136 03/08/17 2255 03/08/17 2256 03/09/17 0632  BP: 135/63  (!) 161/71 (!) 151/70  Pulse: (!) 52  79 70  Resp: 18  18 18   Temp:   98.3 F (36.8 C) 98.4 F (36.9 C)  TempSrc:   Oral Oral  SpO2: 95%  95% 98%  Weight:  65 kg (143 lb 4.8 oz)    Height:  4\' 8"  (1.422 m)      Wt Readings from Last 3 Encounters:  03/08/17 65 kg (143 lb 4.8 oz)  03/06/17 67.1 kg (148 lb)  10/12/16 75.5 kg (166 lb 6.4 oz)     Intake/Output Summary (Last 24 hours) at 03/09/2017 0935 Last data filed at 03/09/2017 1610 Gross per 24 hour  Intake 1563.33 ml  Output -  Net 1563.33 ml     Physical Exam  Gen: not in distress HEENT: no pallor, moist mucosa, supple neck Chest: clear b/l, no added sounds CVS: N S1&S2, no murmurs GI: soft, NT, ND, BS+ Musculoskeletal: warm, no edema     Data Review:     CBC Recent Labs  Lab 03/06/17 1252 03/08/17 1159 03/09/17 0400  WBC 6.1 7.8 7.2  HGB 16.8* 15.6* 14.1  HCT 50.4* 45.9 42.6  PLT 276 341 329  MCV 88.6 87.4 88.2  MCH 29.5 29.7 29.2  MCHC 33.3 34.0 33.1  RDW 13.9 14.0 14.2    Chemistries  Recent Labs  Lab 03/06/17 1252 03/08/17 1159 03/09/17 0400  NA 134* 138 140  K 3.3* 3.5 3.3*  CL 105 104 109  CO2 15* 21* 20*  GLUCOSE 125* 108* 85  BUN 23* 10 9  CREATININE 1.16* 0.81 0.73  CALCIUM 8.4* 8.8* 8.2*  AST 70* 99* 74*  ALT 46 68* 60*  ALKPHOS 91 79 64  BILITOT 0.3 0.4 0.7   ------------------------------------------------------------------------------------------------------------------ No results for input(s): CHOL, HDL, LDLCALC, TRIG, CHOLHDL, LDLDIRECT in the last 72 hours.  No results found for: HGBA1C ------------------------------------------------------------------------------------------------------------------ No results for input(s): TSH, T4TOTAL, T3FREE, THYROIDAB in the last 72 hours.  Invalid input(s): FREET3 ------------------------------------------------------------------------------------------------------------------ No results for input(s): VITAMINB12, FOLATE, FERRITIN, TIBC, IRON, RETICCTPCT in the last 72 hours.  Coagulation profile No results for input(s): INR, PROTIME in the last 168 hours.  No results for input(s): DDIMER in the last 72 hours.  Cardiac Enzymes No results for input(s): CKMB, TROPONINI, MYOGLOBIN in the last 168 hours.  Invalid input(s): CK ------------------------------------------------------------------------------------------------------------------ No results found for: BNP  Inpatient Medications  Scheduled Meds: . enoxaparin (LOVENOX) injection  40 mg Subcutaneous Q24H  . latanoprost  1 drop Both Eyes QHS  . metoprolol succinate  50 mg Oral Daily  . vancomycin  125 mg Oral Q6H   Continuous Infusions: . 0.9 % NaCl with KCl 20 mEq / L 100 mL/hr at 03/09/17  5188   PRN Meds:.acetaminophen **OR** acetaminophen, loperamide, ondansetron (ZOFRAN) IV  Micro Results Recent Results (from the past 240 hour(s))  C difficile quick scan w PCR reflex     Status: Abnormal   Collection Time: 03/08/17  5:24 PM  Result Value Ref Range Status   C Diff antigen POSITIVE (A) NEGATIVE Final   C Diff toxin NEGATIVE NEGATIVE Final   C Diff interpretation Results are indeterminate. See PCR results.  Final    Comment: Performed at [facility], [address], [place], [place] [address]  C. Diff by PCR, Reflexed     Status: Abnormal   Collection Time: 03/08/17  5:24 PM  Result Value Ref Range Status   Toxigenic C. Difficile by PCR POSITIVE (A) NEGATIVE Final    Comment: Positive for toxigenic C. difficile with little to no toxin production. Only treat if clinical presentation suggests symptomatic illness. Performed at [facility], [place] [address] [place], [place] [address]     Radiology Reports Dg Chest 2 View  Result Date: 03/08/2017 CLINICAL DATA:  Dyspnea x2 years. Diagnosed with flu on Monday. Diarrhea and vomiting. Everyday smoker. EXAM: CHEST  2 VIEW COMPARISON:  03/06/2017 FINDINGS: The heart size and mediastinal contours are within normal limits. Aortic atherosclerosis along the transverse portion without aneurysmal dilatation. Chronic bilateral interstitial prominence consistent with smoking related bronchitic change. No alveolar consolidation or CHF. No effusion or pneumothorax. The visualized skeletal structures are nonacute. IMPRESSION: Chronic smoking related bronchitic change of the lungs. Electronically Signed   By: Ashley Royalty M.D.   On: 03/08/2017 18:31   Dg Chest 2 View  Result Date: 03/06/2017 CLINICAL DATA:  Productive cough and intermittent shortness of breath for the past week associated with fever and body aches. Patient also reports chest wall discomfort. Current smoker. History of bronchitis. EXAM: CHEST  2 VIEW COMPARISON:  Chest  x-ray of February 09, 2014 and CT scan of the chest of May 19, 2015. FINDINGS: The lungs are well-expanded. The interstitial markings are coarse diffusely. There are patchy densities in the lingula which are stable as well. There is no alveolar infiltrate. There is no pleural effusion or pneumothorax. The heart and pulmonary vascularity are normal. There calcification in the wall of the aortic arch. There is moderate levocurvature centered in the midthoracic spine with multilevel degenerative disc disease. IMPRESSION: COPD. Stable scarring in the lingula. No alveolar pneumonia nor CHF. Thoracic aortic atherosclerosis. Electronically Signed   By: David  Martinique M.D.   On: 03/06/2017 13:46   Ct Abdomen Pelvis W Contrast  Result Date: 03/08/2017 CLINICAL DATA:  Diagnosis of flu.  Vomiting. EXAM: CT ABDOMEN AND PELVIS WITH CONTRAST TECHNIQUE: Multidetector CT imaging of the abdomen and pelvis was performed using the standard protocol following bolus administration of intravenous contrast. CONTRAST:  153mL ISOVUE-300 IOPAMIDOL (ISOVUE-300) INJECTION 61% COMPARISON:  None. FINDINGS: Lower chest: No acute abnormality. Hepatobiliary: Few simple cysts are seen, unchanged from prior visualization on CT. No visible gallstones. There is intrahepatic and extrahepatic biliary ductal dilatation, with common bile duct measuring 13 mm. There is an enhancing lesion at the ampulla, roughly 1.5 cm in diameter, projecting into the duodenum, concern for partially obstructing neoplasm. Consider MRCP or ERCP for further evaluation. See series 2, image 33, series 4, image 44. Pancreas: Unremarkable. No pancreatic ductal dilatation or surrounding inflammatory changes. Spleen: Normal in  size without focal abnormality. Adrenals/Urinary Tract: Adrenal glands are unremarkable. Kidneys are normal, without renal calculi, focal lesion, or hydronephrosis. Bladder is unremarkable. Stomach/Bowel: Although motion degraded, there is fluid in some  wall thickening in the cecum and ascending colon consistent with enteritis. No bowel obstruction. No localized perforation. No appendiceal inflammation. Vascular/Lymphatic: Aortic atherosclerosis. No enlarged lymph nodes. Reproductive: Uterus and bilateral adnexa are unremarkable. Other: No abdominal wall hernia or abnormality. No abdominopelvic ascites. Musculoskeletal: No acute or significant osseous findings. IMPRESSION: Abnormal intrahepatic and extrahepatic biliary ductal dilatation associated with a suspected enhancing lesion in the ampulla; concern for partially obstructing neoplasm. Consider MRCP or ERCP for further evaluation. Suspected mild colitis in the cecum/ascending colon. No bowel obstruction. Electronically Signed   By: Staci Righter M.D.   On: 03/08/2017 18:33   Mr 3d Recon At Scanner  Result Date: 03/09/2017 CLINICAL DATA:  Pain.  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 EXAM: MRI ABDOMEN WITHOUT CONTRAST  (INCLUDING MRCP) TECHNIQUE: Multiplanar multisequence MR imaging of the abdomen was performed. Heavily T2-weighted images of the biliary and pancreatic ducts were obtained, and three-dimensional MRCP images were rendered by post processing. COMPARISON:  03/08/2017 FINDINGS: Lower chest: No acute findings. Hepatobiliary: Exam detail diminished secondary to motion artifact. Several small cysts are identified within the left lobe of liver. These measure up to 8 mm. Moderate intrahepatic biliary dilatation. The common bile duct is increased in caliber measuring 1.3 cm. There is a filling defect within the common bile duct which measures 1.3 cm in length and has a diameter of 0.6 cm, image 4 of series 4. At the level of the ampulla there is a filling defect extending into the lumen of the duodenum which measures approximately 1.5 cm, image 50 of series 10. This is also seen on the 5 minutes delayed postcontrast images measuring 1.5 cm, image 57 of series 29. Pancreas: No pancreatic inflammation.  No main duct dilatation identified. Spleen:  Within normal limits in size and appearance. Adrenals/Urinary Tract: The adrenal glands appear normal. Small bilateral kidney cysts. No mass or hydronephrosis. Stomach/Bowel: Visualized portions within the abdomen are unremarkable. Vascular/Lymphatic: Aortic atherosclerosis. No adenopathy identified. Other:  No ascites or focal fluid collections identified. Musculoskeletal: No abnormal signal from within the bone marrow. IMPRESSION: 1. Diminished exam detail due to motion artifact. 2. Common bile duct dilatation and intrahepatic biliary dilatation is identified. Within the common bile duct there is a stone which measures 1.3 x 0.6 cm. 3. At the level of the ampulla there is an indeterminate filling defect which extends into the lumen of the duodenum measuring approximately 1.5 cm. Etiology indeterminate. Differential considerations include edema and inflammation secondary to impaction of stone at the ampulla versus ampullary neoplasm. Correlation with ERCP 4. Small liver and kidney cysts. 5.  Aortic Atherosclerosis (ICD10-I70.0). Electronically Signed   By: Kerby Moors M.D.   On: 03/09/2017 09:13   Mr Abdomen With Mrcp W Contrast  Result Date: 03/09/2017 CLINICAL DATA:  Pain.  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 EXAM: MRI ABDOMEN WITHOUT CONTRAST  (INCLUDING MRCP) TECHNIQUE: Multiplanar multisequence MR imaging of the abdomen was performed. Heavily T2-weighted images of the biliary and pancreatic ducts were obtained, and three-dimensional MRCP images were rendered by post processing. COMPARISON:  03/08/2017 FINDINGS: Lower chest: No acute findings. Hepatobiliary: Exam detail diminished secondary to motion artifact. Several small cysts are identified within the left lobe of liver. These measure up to 8 mm. Moderate intrahepatic biliary dilatation. The common bile duct is increased in  caliber measuring 1.3 cm. There is a filling defect within the common bile  duct which measures 1.3 cm in length and has a diameter of 0.6 cm, image 4 of series 4. At the level of the ampulla there is a filling defect extending into the lumen of the duodenum which measures approximately 1.5 cm, image 50 of series 10. This is also seen on the 5 minutes delayed postcontrast images measuring 1.5 cm, image 57 of series 29. Pancreas: No pancreatic inflammation. No main duct dilatation identified. Spleen:  Within normal limits in size and appearance. Adrenals/Urinary Tract: The adrenal glands appear normal. Small bilateral kidney cysts. No mass or hydronephrosis. Stomach/Bowel: Visualized portions within the abdomen are unremarkable. Vascular/Lymphatic: Aortic atherosclerosis. No adenopathy identified. Other:  No ascites or focal fluid collections identified. Musculoskeletal: No abnormal signal from within the bone marrow. IMPRESSION: 1. Diminished exam detail due to motion artifact. 2. Common bile duct dilatation and intrahepatic biliary dilatation is identified. Within the common bile duct there is a stone which measures 1.3 x 0.6 cm. 3. At the level of the ampulla there is an indeterminate filling defect which extends into the lumen of the duodenum measuring approximately 1.5 cm. Etiology indeterminate. Differential considerations include edema and inflammation secondary to impaction of stone at the ampulla versus ampullary neoplasm. Correlation with ERCP 4. Small liver and kidney cysts. 5.  Aortic Atherosclerosis (ICD10-I70.0). Electronically Signed   By: Kerby Moors M.D.   On: 03/09/2017 09:13    Time Spent in minutes  25   Asa Baudoin M.D on 03/09/2017 at 9:35 AM  Between 7am to 7pm - Pager - [phone]  After 7pm go to [email or web address] - password [email or web address]  [facility] -  Office  [phone]

## 2017-03-09 NOTE — Consult Note (Signed)
Reason for Consult: Referring Physician:  [name] is an 72 y.o. female.  HPI: Adm,tted thru the ED yesterday with c/o diarrhea and abdominal pain.  She says she has had epigastric pain off and on for 6 months. She presented to the ED for same. She has had diarrhea every since her GB was removed. 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 Suspected mild colitis in the cecum/ascending colon. No bowel obstruction.    MRCP this morning revealed: 2. Common bile duct dilatation and intrahepatic biliary dilatation is identified. Within the common bile duct there is a stone which measures 1.3 x 0.6 cm. 3. At the level of the ampulla there is an indeterminate filling defect which extends into the lumen of the duodenum measuring approximately 1.5 cm. Etiology indeterminate. Differential considerations include edema and inflammation secondary to impaction of stone at the ampulla versus ampullary neoplasm.   Her C diff came back positive and has been started on Vancomycin 155m every six hours Hx of C-diff in the past.  She has not been on any recent antibiotics.   Past Medical History:  Diagnosis Date  . Abnormal EKG   . Glaucoma   . 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 Palpitations   . Shingles   . Sinus infection   . Sinus tachycardia    Mild persistent sinus tachycardia 2009 - on beta blocker    Past Surgical History:  Procedure Laterality Date  . CATARACT EXTRACTION    . CESAREAN SECTION     x 4  . CHOLECYSTECTOMY  1982   cholecystitis  . COLONOSCOPY N/A 06/19/2014   Procedure: COLONOSCOPY;  Surgeon: NRogene Houston MD;  [facility];  Service:  Endoscopy;  Laterality: N/A;  730 - moved to 5/26 @ 12:00  . COLONOSCOPY N/A 09/26/2014   Procedure: COLONOSCOPY;  Surgeon: NRogene Houston MD;  Location: [facility];  Service: Endoscopy;  Laterality: N/A;  1020 - moved to 11:15 - [name] to notify pt  . Growth in ear removed     1994  . KNEE SURGERY    . rt knee replacement     2017  . TONSILLECTOMY     age 72   Family History  Problem Relation Age of Onset  . Dementia Mother   . Alcoholism Father     Social History:  reports that she has been smoking cigarettes.  She has been smoking about 0.50 packs per day. she has never used smokeless tobacco. She reports that she does not drink alcohol or use drugs.  Allergies:  Allergies  Allergen Reactions  . Nsaids Other (See Comments)    Rectal bleeding.  . Cortisone Other (See Comments)    Dizzy/flush/rapid heart beat.  . Demerol [Meperidine] Nausea And Vomiting    Medications: I have reviewed the patient's current medications.  Results for orders placed or performed during the hospital encounter of 03/08/17 (from the past 48 hour(s))  Urinalysis, Routine w reflex microscopic     Status: Abnormal   Collection Time: 03/08/17 10:27 AM  Result Value Ref Range   Color, Urine YELLOW YELLOW   APPearance HAZY (A) CLEAR   Specific Gravity, Urine 1.014 1.005 - 1.030   pH  5.0 5.0 - 8.0   Glucose, UA NEGATIVE NEGATIVE mg/dL   Hgb urine dipstick NEGATIVE NEGATIVE   Bilirubin Urine NEGATIVE NEGATIVE   Ketones, ur NEGATIVE NEGATIVE mg/dL   Protein, ur 30 (A) NEGATIVE mg/dL   Nitrite NEGATIVE NEGATIVE   Leukocytes, UA NEGATIVE NEGATIVE   RBC / HPF 0-5 0 - 5 RBC/hpf   WBC, UA 0-5 0 - 5 WBC/hpf   Bacteria, UA RARE (A) NONE SEEN   Squamous Epithelial / LPF 0-5 (A) NONE SEEN   Mucus PRESENT    Hyaline Casts, UA PRESENT     Comment: Performed at [facility], [address] [place], [place] [address]  Lipase, blood     Status: None   Collection Time: 03/08/17 11:59 AM  Result Value Ref  Range   Lipase 30 11 - 51 U/L    Comment: Performed at [facility], [address] [place], [place] [address]  Comprehensive metabolic panel     Status: Abnormal   Collection Time: 03/08/17 11:59 AM  Result Value Ref Range   Sodium 138 135 - 145 mmol/L   Potassium 3.5 3.5 - 5.1 mmol/L   Chloride 104 101 - 111 mmol/L   CO2 21 (L) 22 - 32 mmol/L   Glucose, Bld 108 (H) 65 - 99 mg/dL   BUN 10 6 - 20 mg/dL   Creatinine, Ser 0.81 0.44 - 1.00 mg/dL   Calcium 8.8 (L) 8.9 - 10.3 mg/dL   Total Protein 7.6 6.5 - 8.1 g/dL   Albumin 3.6 3.5 - 5.0 g/dL   AST 99 (H) 15 - 41 U/L   ALT 68 (H) 14 - 54 U/L   Alkaline Phosphatase 79 38 - 126 U/L   Total Bilirubin 0.4 0.3 - 1.2 mg/dL   GFR calc non Af Amer >60 >60 mL/min   GFR calc Af Amer >60 >60 mL/min    Comment: (NOTE) The eGFR has been calculated using the CKD EPI equation. This calculation has not been validated in all clinical situations. eGFR's persistently <60 mL/min signify possible Chronic Kidney Disease.    Anion gap 13 5 - 15    Comment: Performed at [facility], [address], [place], [place] [address]  CBC     Status: Abnormal   Collection Time: 03/08/17 11:59 AM  Result Value Ref Range   WBC 7.8 4.0 - 10.5 K/uL   RBC 5.25 (H) 3.87 - 5.11 MIL/uL   Hemoglobin 15.6 (H) 12.0 - 15.0 g/dL   HCT 45.9 36.0 - 46.0 %   MCV 87.4 78.0 - 100.0 fL   MCH 29.7 26.0 - 34.0 pg   MCHC 34.0 30.0 - 36.0 g/dL   RDW 14.0 11.5 - 15.5 %   Platelets 341 150 - 400 K/uL    Comment: Performed at [facility], [address], [place], [place] [address]  C difficile quick scan w PCR reflex     Status: Abnormal   Collection Time: 03/08/17  5:24 PM  Result Value Ref Range   C Diff antigen POSITIVE (A) NEGATIVE   C Diff toxin NEGATIVE NEGATIVE   C Diff interpretation Results are indeterminate. See PCR results.     Comment: Performed at [facility], [address], [place], [place] [address]  C. Diff by PCR, Reflexed     Status: Abnormal   Collection  Time: 03/08/17  5:24 PM  Result Value Ref Range   Toxigenic C. Difficile by PCR POSITIVE (A) NEGATIVE    Comment: Positive for  toxigenic C. difficile with little to no toxin production. Only treat if clinical presentation suggests symptomatic illness. Performed at [facility], [place] [address], [place], [place] [address]   Comprehensive metabolic panel     Status: Abnormal   Collection Time: 03/09/17  4:00 AM  Result Value Ref Range   Sodium 140 135 - 145 mmol/L   Potassium 3.3 (L) 3.5 - 5.1 mmol/L   Chloride 109 101 - 111 mmol/L   CO2 20 (L) 22 - 32 mmol/L   Glucose, Bld 85 65 - 99 mg/dL   BUN 9 6 - 20 mg/dL   Creatinine, Ser 0.73 0.44 - 1.00 mg/dL   Calcium 8.2 (L) 8.9 - 10.3 mg/dL   Total Protein 6.6 6.5 - 8.1 g/dL   Albumin 3.0 (L) 3.5 - 5.0 g/dL   AST 74 (H) 15 - 41 U/L   ALT 60 (H) 14 - 54 U/L   Alkaline Phosphatase 64 38 - 126 U/L   Total Bilirubin 0.7 0.3 - 1.2 mg/dL   GFR calc non Af Amer >60 >60 mL/min   GFR calc Af Amer >60 >60 mL/min    Comment: (NOTE) The eGFR has been calculated using the CKD EPI equation. This calculation has not been validated in all clinical situations. eGFR's persistently <60 mL/min signify possible Chronic Kidney Disease.    Anion gap 11 5 - 15    Comment: Performed at [facility], [address], [place], [place] [address]  CBC     Status: None   Collection Time: 03/09/17  4:00 AM  Result Value Ref Range   WBC 7.2 4.0 - 10.5 K/uL   RBC 4.83 3.87 - 5.11 MIL/uL   Hemoglobin 14.1 12.0 - 15.0 g/dL   HCT 42.6 36.0 - 46.0 %   MCV 88.2 78.0 - 100.0 fL   MCH 29.2 26.0 - 34.0 pg   MCHC 33.1 30.0 - 36.0 g/dL   RDW 14.2 11.5 - 15.5 %   Platelets 329 150 - 400 K/uL    Comment: Performed at [facility], [address], [place], [place] [address]    Dg Chest 2 View  Result Date: 03/08/2017 CLINICAL DATA:  Dyspnea x2 years. Diagnosed with flu on Monday. Diarrhea and vomiting. Everyday smoker. EXAM: CHEST  2 VIEW COMPARISON:  03/06/2017  FINDINGS: The heart size and mediastinal contours are within normal limits. Aortic atherosclerosis along the transverse portion without aneurysmal dilatation. Chronic bilateral interstitial prominence consistent with smoking related bronchitic change. No alveolar consolidation or CHF. No effusion or pneumothorax. The visualized skeletal structures are nonacute. IMPRESSION: Chronic smoking related bronchitic change of the lungs. Electronically Signed   By: Ashley Royalty M.D.   On: 03/08/2017 18:31   Ct Abdomen Pelvis W Contrast  Result Date: 03/08/2017 CLINICAL DATA:  Diagnosis of flu.  Vomiting. EXAM: CT ABDOMEN AND PELVIS WITH CONTRAST TECHNIQUE: Multidetector CT imaging of the abdomen and pelvis was performed using the standard protocol following bolus administration of intravenous contrast. CONTRAST:  179m ISOVUE-300 IOPAMIDOL (ISOVUE-300) INJECTION 61% COMPARISON:  None. FINDINGS: Lower chest: No acute abnormality. Hepatobiliary: Few simple cysts are seen, unchanged from prior visualization on CT. No visible gallstones. There is intrahepatic and extrahepatic biliary ductal dilatation, with common bile duct measuring 13 mm. There is an enhancing lesion at the ampulla, roughly 1.5 cm in diameter, projecting into the duodenum, concern for partially obstructing neoplasm. Consider MRCP or ERCP for further evaluation. See series 2, image 33, series 4, image 44. Pancreas: Unremarkable.  No pancreatic ductal dilatation or surrounding inflammatory changes. Spleen: Normal in size without focal abnormality. Adrenals/Urinary Tract: Adrenal glands are unremarkable. Kidneys are normal, without renal calculi, focal lesion, or hydronephrosis. Bladder is unremarkable. Stomach/Bowel: Although motion degraded, there is fluid in some wall thickening in the cecum and ascending colon consistent with enteritis. No bowel obstruction. No localized perforation. No appendiceal inflammation. Vascular/Lymphatic: Aortic atherosclerosis. No  enlarged lymph nodes. Reproductive: Uterus and bilateral adnexa are unremarkable. Other: No abdominal wall hernia or abnormality. No abdominopelvic ascites. Musculoskeletal: No acute or significant osseous findings. IMPRESSION: Abnormal intrahepatic and extrahepatic biliary ductal dilatation associated with a suspected enhancing lesion in the ampulla; concern for partially obstructing neoplasm. Consider MRCP or ERCP for further evaluation. Suspected mild colitis in the cecum/ascending colon. No bowel obstruction. Electronically Signed   By: Staci Righter M.D.   On: 03/08/2017 18:33   Mr 3d Recon At Scanner  Result Date: 03/09/2017 CLINICAL DATA:  Pain.  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 EXAM: MRI ABDOMEN WITHOUT CONTRAST  (INCLUDING MRCP) TECHNIQUE: Multiplanar multisequence MR imaging of the abdomen was performed. Heavily T2-weighted images of the biliary and pancreatic ducts were obtained, and three-dimensional MRCP images were rendered by post processing. COMPARISON:  03/08/2017 FINDINGS: Lower chest: No acute findings. Hepatobiliary: Exam detail diminished secondary to motion artifact. Several small cysts are identified within the left lobe of liver. These measure up to 8 mm. Moderate intrahepatic biliary dilatation. The common bile duct is increased in caliber measuring 1.3 cm. There is a filling defect within the common bile duct which measures 1.3 cm in length and has a diameter of 0.6 cm, image 4 of series 4. At the level of the ampulla there is a filling defect extending into the lumen of the duodenum which measures approximately 1.5 cm, image 50 of series 10. This is also seen on the 5 minutes delayed postcontrast images measuring 1.5 cm, image 57 of series 29. Pancreas: No pancreatic inflammation. No main duct dilatation identified. Spleen:  Within normal limits in size and appearance. Adrenals/Urinary Tract: The adrenal glands appear normal. Small bilateral kidney cysts. No mass or  hydronephrosis. Stomach/Bowel: Visualized portions within the abdomen are unremarkable. Vascular/Lymphatic: Aortic atherosclerosis. No adenopathy identified. Other:  No ascites or focal fluid collections identified. Musculoskeletal: No abnormal signal from within the bone marrow. IMPRESSION: 1. Diminished exam detail due to motion artifact. 2. Common bile duct dilatation and intrahepatic biliary dilatation is identified. Within the common bile duct there is a stone which measures 1.3 x 0.6 cm. 3. At the level of the ampulla there is an indeterminate filling defect which extends into the lumen of the duodenum measuring approximately 1.5 cm. Etiology indeterminate. Differential considerations include edema and inflammation secondary to impaction of stone at the ampulla versus ampullary neoplasm. Correlation with ERCP 4. Small liver and kidney cysts. 5.  Aortic Atherosclerosis (ICD10-I70.0). Electronically Signed   By: Kerby Moors M.D.   On: 03/09/2017 09:13   Mr Abdomen With Mrcp W Contrast  Result Date: 03/09/2017 CLINICAL DATA:  Pain.  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 EXAM: MRI ABDOMEN WITHOUT CONTRAST  (INCLUDING MRCP) TECHNIQUE: Multiplanar multisequence MR imaging of the abdomen was performed. Heavily T2-weighted images of the biliary and pancreatic ducts were obtained, and three-dimensional MRCP images were rendered by post processing. COMPARISON:  03/08/2017 FINDINGS: Lower chest: No acute findings. Hepatobiliary: Exam detail diminished secondary to motion artifact. Several small cysts are identified within the left lobe of liver. These measure up to 8 mm.  Moderate intrahepatic biliary dilatation. The common bile duct is increased in caliber measuring 1.3 cm. There is a filling defect within the common bile duct which measures 1.3 cm in length and has a diameter of 0.6 cm, image 4 of series 4. At the level of the ampulla there is a filling defect extending into the lumen of the duodenum which  measures approximately 1.5 cm, image 50 of series 10. This is also seen on the 5 minutes delayed postcontrast images measuring 1.5 cm, image 57 of series 29. Pancreas: No pancreatic inflammation. No main duct dilatation identified. Spleen:  Within normal limits in size and appearance. Adrenals/Urinary Tract: The adrenal glands appear normal. Small bilateral kidney cysts. No mass or hydronephrosis. Stomach/Bowel: Visualized portions within the abdomen are unremarkable. Vascular/Lymphatic: Aortic atherosclerosis. No adenopathy identified. Other:  No ascites or focal fluid collections identified. Musculoskeletal: No abnormal signal from within the bone marrow. IMPRESSION: 1. Diminished exam detail due to motion artifact. 2. Common bile duct dilatation and intrahepatic biliary dilatation is identified. Within the common bile duct there is a stone which measures 1.3 x 0.6 cm. 3. At the level of the ampulla there is an indeterminate filling defect which extends into the lumen of the duodenum measuring approximately 1.5 cm. Etiology indeterminate. Differential considerations include edema and inflammation secondary to impaction of stone at the ampulla versus ampullary neoplasm. Correlation with ERCP 4. Small liver and kidney cysts. 5.  Aortic Atherosclerosis (ICD10-I70.0). Electronically Signed   By: Kerby Moors M.D.   On: 03/09/2017 09:13    ROS Blood pressure (!) 151/70, pulse 70, temperature 98.4 F (36.9 C), temperature source Oral, resp. rate 18, height '4\' 8"'  (1.422 m), weight 143 lb 4.8 oz (65 kg), SpO2 98 %. Physical Exam Alert and oriented. Skin warm and dry. Oral mucosa is moist.   . Sclera anicteric, conjunctivae is pink. Thyroid not enlarged. No cervical lymphadenopathy. Lungs clear. Heart regular rate and rhythm.  Abdomen is soft. Bowel sounds are positive. No hepatomegaly. No abdominal masses felt. No tenderness.  No edema to lower extremities.        Assessment/Plan:  CBD stone.Dr. Laural Golden is  aware. ERCP probably tomorrow. C-diff. Agree with Vancomycin 131m QID.   [name] L Setzer 03/09/2017, 10:24 AM

## 2017-03-10 ENCOUNTER — Inpatient Hospital Stay (HOSPITAL_COMMUNITY): Payer: Medicare Other | Admitting: Anesthesiology

## 2017-03-10 ENCOUNTER — Encounter (HOSPITAL_COMMUNITY)
Admission: EM | Disposition: A | Discharge status: Home / Self Care | Payer: Self-pay | Source: Home / Self Care | Attending: Internal Medicine

## 2017-03-10 ENCOUNTER — Encounter (HOSPITAL_COMMUNITY): Payer: Self-pay | Admitting: Anesthesiology

## 2017-03-10 ENCOUNTER — Inpatient Hospital Stay (HOSPITAL_COMMUNITY): Payer: Medicare Other

## 2017-03-10 HISTORY — PX: SPHINCTEROTOMY: SHX5544

## 2017-03-10 HISTORY — PX: BILIARY STENT PLACEMENT: SHX5538

## 2017-03-10 HISTORY — PX: STONE EXTRACTION WITH BASKET: SHX5318

## 2017-03-10 HISTORY — PX: ERCP: SHX5425

## 2017-03-10 LAB — COMPREHENSIVE METABOLIC PANEL
ALK PHOS: 69 U/L (ref 38–126)
ALT: 48 U/L (ref 14–54)
AST: 47 U/L — ABNORMAL HIGH (ref 15–41)
Albumin: 3.2 g/dL — ABNORMAL LOW (ref 3.5–5.0)
Anion gap: 11 (ref 5–15)
BILIRUBIN TOTAL: 0.8 mg/dL (ref 0.3–1.2)
BUN: 6 mg/dL (ref 6–20)
CALCIUM: 8.4 mg/dL — AB (ref 8.9–10.3)
CO2: 21 mmol/L — AB (ref 22–32)
Chloride: 107 mmol/L (ref 101–111)
Creatinine, Ser: 0.55 mg/dL (ref 0.44–1.00)
GFR calc non Af Amer: >60 mL/min (ref >60)
Glucose, Bld: 83 mg/dL (ref 65–99)
POTASSIUM: 3 mmol/L — AB (ref 3.5–5.1)
SODIUM: 139 mmol/L (ref 135–145)
TOTAL PROTEIN: 6.9 g/dL (ref 6.5–8.1)

## 2017-03-10 LAB — HEPATITIS PANEL, ACUTE
Hep A IgM: NEGATIVE
Hep B C IgM: NEGATIVE
Hepatitis B Surface Ag: NEGATIVE

## 2017-03-10 LAB — PROTIME-INR
INR: 1.03
Prothrombin Time: 13.5 seconds (ref 11.4–15.2)

## 2017-03-10 SURGERY — ERCP, WITH INTERVENTION IF INDICATED
Anesthesia: General

## 2017-03-10 MED ORDER — IPRATROPIUM-ALBUTEROL 0.5-2.5 (3) MG/3ML IN SOLN
RESPIRATORY_TRACT | Status: AC
  Filled 2017-03-10: qty 3

## 2017-03-10 MED ORDER — FENTANYL CITRATE (PF) 100 MCG/2ML IJ SOLN: 25.0000 ug | INTRAMUSCULAR | Status: DC | PRN

## 2017-03-10 MED ORDER — MIDAZOLAM HCL 2 MG/2ML IJ SOLN
INTRAMUSCULAR | Status: AC
  Filled 2017-03-10: qty 2

## 2017-03-10 MED ORDER — LORAZEPAM 2 MG/ML IJ SOLN
0.5000 mg | INTRAMUSCULAR | Status: DC | PRN
  Administered 2017-03-10: 0.5 mg via INTRAVENOUS
  Filled 2017-03-10: qty 1

## 2017-03-10 MED ORDER — GLUCAGON HCL RDNA (DIAGNOSTIC) 1 MG IJ SOLR
INTRAMUSCULAR | Status: DC | PRN
  Administered 2017-03-10 (×5): 0.25 mg via INTRAVENOUS

## 2017-03-10 MED ORDER — SUCCINYLCHOLINE CHLORIDE 20 MG/ML IJ SOLN
INTRAMUSCULAR | Status: DC | PRN
  Administered 2017-03-10: 120 mg via INTRAVENOUS

## 2017-03-10 MED ORDER — EPHEDRINE SULFATE 50 MG/ML IJ SOLN
INTRAMUSCULAR | Status: DC | PRN
  Administered 2017-03-10 (×2): 5 mg via INTRAVENOUS
  Administered 2017-03-10: 15 mg via INTRAVENOUS
  Administered 2017-03-10: 5 mg via INTRAVENOUS

## 2017-03-10 MED ORDER — SUGAMMADEX SODIUM 200 MG/2ML IV SOLN
INTRAVENOUS | Status: AC
  Filled 2017-03-10: qty 2

## 2017-03-10 MED ORDER — SODIUM CHLORIDE 0.9 % IV SOLN
INTRAVENOUS | Status: DC
  Administered 2017-03-10: 04:00:00 via INTRAVENOUS

## 2017-03-10 MED ORDER — IOPAMIDOL (ISOVUE-300) INJECTION 61%
INTRAVENOUS | Status: DC | PRN
  Administered 2017-03-10: 50 mL

## 2017-03-10 MED ORDER — PROPOFOL 10 MG/ML IV BOLUS: INTRAVENOUS | Status: DC | PRN

## 2017-03-10 MED ORDER — SUGAMMADEX SODIUM 500 MG/5ML IV SOLN
INTRAVENOUS | Status: DC | PRN
  Administered 2017-03-10: 200 mg via INTRAVENOUS

## 2017-03-10 MED ORDER — FENTANYL CITRATE (PF) 100 MCG/2ML IJ SOLN
INTRAMUSCULAR | Status: DC | PRN
  Administered 2017-03-10: 50 ug via INTRAVENOUS
  Administered 2017-03-10: 100 ug via INTRAVENOUS

## 2017-03-10 MED ORDER — PHENYLEPHRINE HCL 10 MG/ML IJ SOLN
INTRAMUSCULAR | Status: DC | PRN
  Administered 2017-03-10 (×4): 40 ug via INTRAVENOUS

## 2017-03-10 MED ORDER — STERILE WATER FOR IRRIGATION IR SOLN
Status: DC | PRN
  Administered 2017-03-10: 5 mL

## 2017-03-10 MED ORDER — SODIUM CHLORIDE 0.9 % IJ SOLN
INTRAMUSCULAR | Status: AC
  Filled 2017-03-10: qty 10

## 2017-03-10 MED ORDER — LACTATED RINGERS IV SOLN
INTRAVENOUS | Status: DC
  Administered 2017-03-10: 1000 mL via INTRAVENOUS

## 2017-03-10 MED ORDER — IPRATROPIUM-ALBUTEROL 0.5-2.5 (3) MG/3ML IN SOLN
3.0000 mL | Freq: Once | RESPIRATORY_TRACT | Status: AC
  Administered 2017-03-10: 3 mL via RESPIRATORY_TRACT

## 2017-03-10 MED ORDER — MIDAZOLAM HCL 2 MG/2ML IJ SOLN
1.0000 mg | INTRAMUSCULAR | Status: DC
  Administered 2017-03-10: 2 mg via INTRAVENOUS

## 2017-03-10 MED ORDER — IOPAMIDOL (ISOVUE-300) INJECTION 61%
INTRAVENOUS | Status: AC
  Filled 2017-03-10: qty 100

## 2017-03-10 MED ORDER — EPHEDRINE SULFATE 50 MG/ML IJ SOLN
INTRAMUSCULAR | Status: AC
  Filled 2017-03-10: qty 1

## 2017-03-10 MED ORDER — ROCURONIUM BROMIDE 100 MG/10ML IV SOLN
INTRAVENOUS | Status: DC | PRN
  Administered 2017-03-10: 5 mg via INTRAVENOUS
  Administered 2017-03-10: 15 mg via INTRAVENOUS

## 2017-03-10 MED ORDER — METOPROLOL TARTRATE 5 MG/5ML IV SOLN
INTRAVENOUS | Status: DC | PRN
  Administered 2017-03-10: 2 mg via INTRAVENOUS

## 2017-03-10 MED ORDER — METOPROLOL TARTRATE 5 MG/5ML IV SOLN
INTRAVENOUS | Status: AC
  Filled 2017-03-10: qty 5

## 2017-03-10 MED ORDER — POTASSIUM CHLORIDE 10 MEQ/100ML IV SOLN
10.0000 meq | INTRAVENOUS | Status: AC
  Administered 2017-03-10 (×2): 10 meq via INTRAVENOUS
  Filled 2017-03-10 (×2): qty 100

## 2017-03-10 MED ORDER — FENTANYL CITRATE (PF) 100 MCG/2ML IJ SOLN
INTRAMUSCULAR | Status: AC
  Filled 2017-03-10: qty 2

## 2017-03-10 MED ORDER — PROPOFOL 10 MG/ML IV BOLUS
INTRAVENOUS | Status: DC | PRN
  Administered 2017-03-10: 130 mg via INTRAVENOUS

## 2017-03-10 MED ORDER — GLUCAGON HCL RDNA (DIAGNOSTIC) 1 MG IJ SOLR
INTRAMUSCULAR | Status: AC
  Filled 2017-03-10: qty 2

## 2017-03-10 MED ORDER — GLUCAGON HCL RDNA (DIAGNOSTIC) 1 MG IJ SOLR
INTRAMUSCULAR | Status: AC
  Filled 2017-03-10: qty 1

## 2017-03-10 NOTE — Progress Notes (Signed)
  Subjective:  Patient denies chest pain shortness of breath or abdominal pain.  She has not had any bowel movement since yesterday afternoon.  She has cough but no sputum.  According to her husband she has been very anxious.  She was given lorazepam which has helped.  Patient wants to proceed with ERCP today rather than waiting.   Objective: Blood pressure 130/71, pulse 63, temperature 98.4 F (36.9 C), temperature source Oral, resp. rate 16, height 4\' 8"  (1.422 m), weight 143 lb 4.8 oz (65 kg), SpO2 95 %. Patient is alert and in no acute distress. Conjunctiva is pink. Sclera is nonicteric Heart exam with regular rhythm normal S1 and S2.  No murmur or gallop. Auscultation of lungs reveal vesicular breath sounds bilaterally without rales or rhonchi. Abdomen is full.  Bowel sounds are normal.  On palpation it is soft and nontender without organomegaly or masses. No LE edema or clubbing noted.  Labs/studies Results:  Recent Labs    03-23-17 1159 03/09/17 0400  WBC 7.8 7.2  HGB 15.6* 14.1  HCT 45.9 42.6  PLT 341 329    BMET  Recent Labs    2017/03/23 1159 03/09/17 0400 03/10/17 0835  NA 138 140 139  K 3.5 3.3* 3.0*  CL 104 109 107  CO2 21* 20* 21*  GLUCOSE 108* 85 83  BUN 10 9 6   CREATININE 0.81 0.73 0.55  CALCIUM 8.8* 8.2* 8.4*    LFT  Recent Labs    March 23, 2017 1159 03/09/17 0400 03/10/17 0835  PROT 7.6 6.6 6.9  ALBUMIN 3.6 3.0* 3.2*  AST 99* 74* 47*  ALT 68* 60* 48  ALKPHOS 79 64 69  BILITOT 0.4 0.7 0.8    PT/INR  Recent Labs    03/10/17 0835  LABPROT 13.5  INR 1.03    Hepatitis Panel  Recent Labs    03/09/17 0400  HEPBSAG Negative  HCVAB <0.1  HEPAIGM Negative  HEPBIGM Negative     Assessment:  #1.  Choledocholithiasis.  AST and ALT are coming down.  Stone is not obstructing the duct completely.  Hence no symptoms but she is at risk for cholangitis and needs to have the stone removed.  Patient is deemed to be stable from GI and respiratory  standpoint.  #2.  C. difficile colitis.  She has not had a bowel movement in 18 hours.  She is on p.o. vancomycin.  She does not appear toxic and does not have leukocytosis.  #3.  Positive influenza A by PCR.  Patient has cough which has improved since last week.  Resume on no medications.  #4.  Hypokalemia.  We will give her IV potassium since she is n.p.o.   Recommendations:  KCL IV 10 mEq/h x3 doses. ERCP with sphincterotomy and stone extraction later today. Procedure and risks reviewed with the patient and her husband in detail and all their questions answered.  She is agreeable to proceed with this procedure today.

## 2017-03-10 NOTE — Progress Notes (Signed)
Brief ERCP note.  Gastroduodenitis with duodenal erosions. Ulcer involving ampulla of water with marked edema. Dilated biliary system with single dates shaped large stone. Biliary sphincterotomy performed. Stone too large to be removed in one piece. Mechanical lithotripsy performed and multiple fragments removed. 10 French 5 cm long plastic biliary stent placed because of marked edema and ampullary ulceration.

## 2017-03-10 NOTE — Anesthesia Procedure Notes (Signed)
Procedure Name: Intubation Date/Time: 03/10/2017 2:37 PM Performed by: Vista Deck, CRNA Pre-anesthesia Checklist: Patient identified, Patient being monitored, Timeout performed, Emergency Drugs available and Suction available Patient Re-evaluated:Patient Re-evaluated prior to induction Oxygen Delivery Method: Circle System Utilized Preoxygenation: Pre-oxygenation with 100% oxygen Induction Type: IV induction Ventilation: Mask ventilation without difficulty Laryngoscope Size: Mac and 3 Grade View: Grade II Tube type: Oral Tube size: 7.0 mm Number of attempts: 1 Airway Equipment and Method: stylet Placement Confirmation: ETT inserted through vocal cords under direct vision,  positive ETCO2 and breath sounds checked- equal and bilateral Secured at: 21 cm Tube secured with: Tape Dental Injury: Teeth and Oropharynx as per pre-operative assessment

## 2017-03-10 NOTE — Anesthesia Postprocedure Evaluation (Signed)
Anesthesia Post Note  Patient: Kathryn MCKENNY  Procedure(s) Performed: ENDOSCOPIC RETROGRADE CHOLANGIOPANCREATOGRAPHY (ERCP) With sphincterotomy and stone extraction. (N/A ) SPHINCTEROTOMY (N/A ) BILIARY STENT PLACEMENT (N/A ) STONE EXTRACTION WITH BASKET (N/A )  Patient location during evaluation: PACU Anesthesia Type: General Level of consciousness: awake and alert and patient cooperative Pain management: satisfactory to patient Vital Signs Assessment: post-procedure vital signs reviewed and stable Respiratory status: spontaneous breathing Cardiovascular status: stable Postop Assessment: no apparent nausea or vomiting Anesthetic complications: no     Last Vitals:  Vitals:   03/10/17 1615 03/10/17 1630  BP: 113/87 (!) 155/79  Pulse: (!) 109 (!) 110  Resp: 17 19  Temp:    SpO2: 100% 92%    Last Pain:  Vitals:   03/10/17 0645  TempSrc: Oral  PainSc:                  Drucie Opitz

## 2017-03-10 NOTE — Transfer of Care (Signed)
Immediate Anesthesia Transfer of Care Note  Patient: Kathryn Meyers  Procedure(s) Performed: ENDOSCOPIC RETROGRADE CHOLANGIOPANCREATOGRAPHY (ERCP) With sphincterotomy and stone extraction. (N/A ) SPHINCTEROTOMY (N/A ) BILIARY STENT PLACEMENT (N/A ) STONE EXTRACTION WITH BASKET (N/A )  Patient Location: PACU  Anesthesia Type:General  Level of Consciousness: awake, alert  and patient cooperative  Airway & Oxygen Therapy: Patient Spontanous Breathing and non-rebreather face mask  Post-op Assessment: Report given to RN and Post -op Vital signs reviewed and stable  Post vital signs: Reviewed and stable  Last Vitals:  Vitals:   03/10/17 1410 03/10/17 1415  BP:  (!) 156/84  Pulse:    Resp: (!) 28 (!) 26  Temp:    SpO2: 95% 93%    Last Pain:  Vitals:   03/10/17 0645  TempSrc: Oral  PainSc:          Complications: No apparent anesthesia complications

## 2017-03-10 NOTE — Op Note (Signed)
[facility] Patient Name: Kathryn Meyers Procedure Date: 03/10/2017 2:03 PM MRN: [id number] Date of Birth: 1945/07/20 Attending MD: Hildred Laser , MD CSN: [id number] Age: 72 Admit Type: Inpatient Procedure:                ERCP Indications:              Bile duct stone(s), For therapy of bile duct                            stone(s) Providers:                Hildred Laser, MD, Rosina Lowenstein, RN, Randa Spike, Technician Referring MD:             Louellen Molder, MD Medicines:                General Anesthesia Complications:            No immediate complications. Estimated Blood Loss:     None Procedure:                Pre-Anesthesia Assessment:                           - Prior to the procedure, a History and Physical                            was performed, and patient medications and                            allergies were reviewed. The patient's tolerance of                            previous anesthesia was also reviewed. The risks                            and benefits of the procedure and the sedation                            options and risks were discussed with the patient.                            All questions were answered, and informed consent                            was obtained. Prior Anticoagulants: The patient                            last took Lovenox (enoxaparin) 1 day prior to the                            procedure. ASA Grade Assessment: II - A patient                            with mild systemic  disease. After reviewing the                            risks and benefits, the patient was deemed in                            satisfactory condition to undergo the procedure.                           After obtaining informed consent, the scope was                            passed under direct vision. Throughout the                            procedure, the patient's blood pressure, pulse, and   oxygen saturations were monitored continuously. The                            [id number]) scope was introduced through                            the mouth, and used to inject contrast into and                            used to inject contrast into the bile duct. The                            ERCP was accomplished without difficulty. The                            patient tolerated the procedure well. Scope In: 2:54:45 PM Scope Out: 3:43:07 PM Total Procedure Duration: 0 hours 48 minutes 22 seconds  Findings:      The scout film was normal. The major papilla was edematous. The major       papilla was enlarged. The major papilla was ulcerated. The bile duct was       deeply cannulated with the Autotome sphincterotome. Contrast was       injected. I personally interpreted the bile duct images. There was brisk       flow of contrast through the ducts. Image quality was excellent.       Contrast extended to the entire biliary tree. The entire biliary tree       was moderately dilated and diffusely dilated, acquired. The common bile       duct contained one stone, which was large in diameter. An 8 mm biliary       sphincterotomy was made with a braided Autotome sphincterotome using       ERBE electrocautery. There was no post-sphincterotomy bleeding.       Lithotripsy with the mechanical lithotripter was successful. The hepatic       duct bifurcation contained stone(s) mm. One 10 Fr by 5 cm plastic stent       with two internal flaps was placed 3.5 cm into the common bile duct.       Bile flowed through the stent. The stent was in good  position. Impression:               - The major papilla appeared edematous,enlarged and                            ulcerated.                           - The entire biliary tree was moderately dilated,                            acquired.                           - Choledocholithiasis was found. Single large stone.                           - A  biliary sphincterotomy was performed.                           - Lithotripsy was successful.                           - One plastic stent was placed into the common bile                            duct because of edema and ulceration at ampulla. Moderate Sedation:      Per Anesthesia Care Recommendation:           - Return patient to hospital ward for ongoing care.                           - Avoid aspirin and nonsteroidal anti-inflammatory                            medicines for 3 days.                           - Clear liquid diet today.                           - H. pylori serology.                           - Repeat ERCP in 3 months to remove stent. Procedure Code(s):        --- Professional ---                           [phone], Endoscopic retrograde                            cholangiopancreatography (ERCP); with placement of                            endoscopic stent into biliary or pancreatic duct,  including pre- and post-dilation and guide wire                            passage, when performed, including sphincterotomy,                            when performed, each stent                           [id number], Endoscopic retrograde                            cholangiopancreatography (ERCP); with destruction                            of calculi, any method (eg, mechanical,                            electrohydraulic, lithotripsy) Diagnosis Code(s):        --- Professional ---                           K83.9, Disease of biliary tract, unspecified                           K80.50, Calculus of bile duct without cholangitis                            or cholecystitis without obstruction                           K83.8, Other specified diseases of biliary tract CPT copyright 2016 American Medical Association. All rights reserved. The codes documented in this report are preliminary and upon coder review may  be revised to meet current compliance  requirements. Hildred Laser, MD Hildred Laser, MD 03/10/2017 4:13:47 PM This report has been signed electronically. Number of Addenda: 0

## 2017-03-10 NOTE — Progress Notes (Signed)
PROGRESS NOTE                                                                                                                                                                                                             Patient Demographics:    Kathryn Meyers, is a 72 y.o. female, DOB - 29-Apr-1945, KVQ:[id number]  Admit date - 03/08/2017   Admitting Physician Jani Gravel, MD  Outpatient Primary MD for the patient is Deloria Lair., MD  LOS - 2  Outpatient Specialists:    Chief Complaint  Patient presents with  . Emesis       Brief Narrative 72 year old female with history of recently diagnosed influenza A but did not take Tamiflu presented with diarrhea since last 5-6 days with associated nausea.  Patient has history of C. difficile in the past (took Flagyl which made her sick).  Patient denies any fevers, chills, vomiting, has mild lower abdominal pain.  Labs showed mild transaminitis with normal bilirubin.  CT of the abdomen showed abnormal intra-and extrahepatic biliary ductal dilatation with questionable enhancing lesion in the ampulla concerning for a partially obstructing neoplasm.  Also had mild colitis in the cecum/ascending colon.    Subjective:   Patient extremely anxious about ERCP.  Reports almost 5 episodes of diarrhea since yesterday morning.   Assessment  & Plan :    Principal Problem: C. difficile colitis ([place]) Continue oral vancomycin.  IV hydration with normal saline.  maintain enteric precautions.  Active Problems: Choledocholithiasis with transaminitis.  MRCP shows intrahepatic and CBD dilatation with an impacted stone in the CBD measuring 1.3X 0.6 cm.  There is filling defect at the ampulla which could be due to edema from inflammation with impacted stone versus ampullary neoplasm.  GI consult appreciated.  ERCP today. Check LFTs and lipase tomorrow morning.  Recent influenza A Did not take  Tamiflu.  Reports her URI symptoms to have improved.  Hypokalemia Replenished   Code Status : Full code  Family Communication  : Husband at bedside  Disposition Plan  : Home tomorrow if stable following ERCP and improved LFTs and normal lipase  Barriers For Discharge : Active symptoms  Consults  : GI  Procedures  : CT abdomen MRCP ERCP  DVT Prophylaxis  :  Lovenox -   Lab Results  Component Value Date  PLT 329 03/09/2017    Antibiotics  :    Anti-infectives (From admission, onward)   Start     Dose/Rate Route Frequency Ordered Stop   03/09/17 0000  vancomycin (VANCOCIN) 50 mg/mL oral solution 125 mg  Status:  Discontinued     125 mg Oral Every 6 hours 03/08/17 2247 03/08/17 2253   03/08/17 2000  vancomycin (VANCOCIN) 50 mg/mL oral solution 125 mg     125 mg Oral Every 6 hours 03/08/17 1942     03/08/17 0000  vancomycin (VANCOCIN) 250 MG capsule     250 mg Oral 4 times daily 03/08/17 2100 03/18/17 2359        Objective:   Vitals:   03/09/17 0632 03/09/17 1447 03/09/17 2146 03/10/17 0645  BP: (!) 151/70 (!) 153/63 (!) 142/70 130/71  Pulse: 70 (!) 57 60 63  Resp: 18 16 16 16   Temp: 98.4 F (36.9 C) 98.3 F (36.8 C) 98.2 F (36.8 C) 98.4 F (36.9 C)  TempSrc: Oral Oral Oral Oral  SpO2: 98% 96% 95% 95%  Weight:      Height:        Wt Readings from Last 3 Encounters:  03/08/17 65 kg (143 lb 4.8 oz)  03/06/17 67.1 kg (148 lb)  10/12/16 75.5 kg (166 lb 6.4 oz)     Intake/Output Summary (Last 24 hours) at 03/10/2017 0911 Last data filed at 03/10/2017 0606 Gross per 24 hour  Intake 885.67 ml  Output -  Net 885.67 ml     Physical Exam General: Elderly female not in distress HEENT: Moist mucosa, supple neck Chest: Clear bilaterally CVS: Normal S1 and S2, no murmurs GI: Soft, nondistended, nontender, bowel sounds present Musculoskeletal: Warm, no edema     Data Review:    CBC Recent Labs  Lab 03/06/17 1252 03/08/17 1159 03/09/17 0400    WBC 6.1 7.8 7.2  HGB 16.8* 15.6* 14.1  HCT 50.4* 45.9 42.6  PLT 276 341 329  MCV 88.6 87.4 88.2  MCH 29.5 29.7 29.2  MCHC 33.3 34.0 33.1  RDW 13.9 14.0 14.2    Chemistries  Recent Labs  Lab 03/06/17 1252 03/08/17 1159 03/09/17 0400  NA 134* 138 140  K 3.3* 3.5 3.3*  CL 105 104 109  CO2 15* 21* 20*  GLUCOSE 125* 108* 85  BUN 23* 10 9  CREATININE 1.16* 0.81 0.73  CALCIUM 8.4* 8.8* 8.2*  AST 70* 99* 74*  ALT 46 68* 60*  ALKPHOS 91 79 64  BILITOT 0.3 0.4 0.7   ------------------------------------------------------------------------------------------------------------------ No results for input(s): CHOL, HDL, LDLCALC, TRIG, CHOLHDL, LDLDIRECT in the last 72 hours.  No results found for: HGBA1C ------------------------------------------------------------------------------------------------------------------ No results for input(s): TSH, T4TOTAL, T3FREE, THYROIDAB in the last 72 hours.  Invalid input(s): FREET3 ------------------------------------------------------------------------------------------------------------------ No results for input(s): VITAMINB12, FOLATE, FERRITIN, TIBC, IRON, RETICCTPCT in the last 72 hours.  Coagulation profile Recent Labs  Lab 03/10/17 0835  INR 1.03    No results for input(s): DDIMER in the last 72 hours.  Cardiac Enzymes No results for input(s): CKMB, TROPONINI, MYOGLOBIN in the last 168 hours.  Invalid input(s): CK ------------------------------------------------------------------------------------------------------------------ No results found for: BNP  Inpatient Medications  Scheduled Meds: . glucagon (human recombinant)      . iopamidol      . latanoprost  1 drop Both Eyes QHS  . metoprolol succinate  50 mg Oral Daily  . vancomycin  125 mg Oral Q6H   Continuous Infusions: . sodium chloride 20 mL/hr at 03/10/17  0409   PRN Meds:.acetaminophen **OR** acetaminophen, loperamide, LORazepam, ondansetron (ZOFRAN)  IV  Micro Results Recent Results (from the past 240 hour(s))  C difficile quick scan w PCR reflex     Status: Abnormal   Collection Time: 03/08/17  5:24 PM  Result Value Ref Range Status   C Diff antigen POSITIVE (A) NEGATIVE Final   C Diff toxin NEGATIVE NEGATIVE Final   C Diff interpretation Results are indeterminate. See PCR results.  Final    Comment: Performed at [facility], [address], [place], [place] [address]  C. Diff by PCR, Reflexed     Status: Abnormal   Collection Time: 03/08/17  5:24 PM  Result Value Ref Range Status   Toxigenic C. Difficile by PCR POSITIVE (A) NEGATIVE Final    Comment: Positive for toxigenic C. difficile with little to no toxin production. Only treat if clinical presentation suggests symptomatic illness. Performed at [facility], [place] [address] [place], [place] [address]     Radiology Reports Dg Chest 2 View  Result Date: 03/08/2017 CLINICAL DATA:  Dyspnea x2 years. Diagnosed with flu on Monday. Diarrhea and vomiting. Everyday smoker. EXAM: CHEST  2 VIEW COMPARISON:  03/06/2017 FINDINGS: The heart size and mediastinal contours are within normal limits. Aortic atherosclerosis along the transverse portion without aneurysmal dilatation. Chronic bilateral interstitial prominence consistent with smoking related bronchitic change. No alveolar consolidation or CHF. No effusion or pneumothorax. The visualized skeletal structures are nonacute. IMPRESSION: Chronic smoking related bronchitic change of the lungs. Electronically Signed   By: Ashley Royalty M.D.   On: 03/08/2017 18:31   Dg Chest 2 View  Result Date: 03/06/2017 CLINICAL DATA:  Productive cough and intermittent shortness of breath for the past week associated with fever and body aches. Patient also reports chest wall discomfort. Current smoker. History of bronchitis. EXAM: CHEST  2 VIEW COMPARISON:  Chest x-ray of February 09, 2014 and CT scan of the chest of May 19, 2015. FINDINGS: The lungs  are well-expanded. The interstitial markings are coarse diffusely. There are patchy densities in the lingula which are stable as well. There is no alveolar infiltrate. There is no pleural effusion or pneumothorax. The heart and pulmonary vascularity are normal. There calcification in the wall of the aortic arch. There is moderate levocurvature centered in the midthoracic spine with multilevel degenerative disc disease. IMPRESSION: COPD. Stable scarring in the lingula. No alveolar pneumonia nor CHF. Thoracic aortic atherosclerosis. Electronically Signed   By: David  Martinique M.D.   On: 03/06/2017 13:46   Ct Abdomen Pelvis W Contrast  Result Date: 03/08/2017 CLINICAL DATA:  Diagnosis of flu.  Vomiting. EXAM: CT ABDOMEN AND PELVIS WITH CONTRAST TECHNIQUE: Multidetector CT imaging of the abdomen and pelvis was performed using the standard protocol following bolus administration of intravenous contrast. CONTRAST:  167mL ISOVUE-300 IOPAMIDOL (ISOVUE-300) INJECTION 61% COMPARISON:  None. FINDINGS: Lower chest: No acute abnormality. Hepatobiliary: Few simple cysts are seen, unchanged from prior visualization on CT. No visible gallstones. There is intrahepatic and extrahepatic biliary ductal dilatation, with common bile duct measuring 13 mm. There is an enhancing lesion at the ampulla, roughly 1.5 cm in diameter, projecting into the duodenum, concern for partially obstructing neoplasm. Consider MRCP or ERCP for further evaluation. See series 2, image 33, series 4, image 44. Pancreas: Unremarkable. No pancreatic ductal dilatation or surrounding inflammatory changes. Spleen: Normal in size without focal abnormality. Adrenals/Urinary Tract: Adrenal glands are unremarkable. Kidneys are normal, without renal calculi, focal lesion, or hydronephrosis. Bladder  is unremarkable. Stomach/Bowel: Although motion degraded, there is fluid in some wall thickening in the cecum and ascending colon consistent with enteritis. No bowel  obstruction. No localized perforation. No appendiceal inflammation. Vascular/Lymphatic: Aortic atherosclerosis. No enlarged lymph nodes. Reproductive: Uterus and bilateral adnexa are unremarkable. Other: No abdominal wall hernia or abnormality. No abdominopelvic ascites. Musculoskeletal: No acute or significant osseous findings. IMPRESSION: Abnormal intrahepatic and extrahepatic biliary ductal dilatation associated with a suspected enhancing lesion in the ampulla; concern for partially obstructing neoplasm. Consider MRCP or ERCP for further evaluation. Suspected mild colitis in the cecum/ascending colon. No bowel obstruction. Electronically Signed   By: Staci Righter M.D.   On: 03/08/2017 18:33   Mr 3d Recon At Scanner  Result Date: 03/09/2017 CLINICAL DATA:  Pain.  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 EXAM: MRI ABDOMEN WITHOUT CONTRAST  (INCLUDING MRCP) TECHNIQUE: Multiplanar multisequence MR imaging of the abdomen was performed. Heavily T2-weighted images of the biliary and pancreatic ducts were obtained, and three-dimensional MRCP images were rendered by post processing. COMPARISON:  03/08/2017 FINDINGS: Lower chest: No acute findings. Hepatobiliary: Exam detail diminished secondary to motion artifact. Several small cysts are identified within the left lobe of liver. These measure up to 8 mm. Moderate intrahepatic biliary dilatation. The common bile duct is increased in caliber measuring 1.3 cm. There is a filling defect within the common bile duct which measures 1.3 cm in length and has a diameter of 0.6 cm, image 4 of series 4. At the level of the ampulla there is a filling defect extending into the lumen of the duodenum which measures approximately 1.5 cm, image 50 of series 10. This is also seen on the 5 minutes delayed postcontrast images measuring 1.5 cm, image 57 of series 29. Pancreas: No pancreatic inflammation. No main duct dilatation identified. Spleen:  Within normal limits in size and  appearance. Adrenals/Urinary Tract: The adrenal glands appear normal. Small bilateral kidney cysts. No mass or hydronephrosis. Stomach/Bowel: Visualized portions within the abdomen are unremarkable. Vascular/Lymphatic: Aortic atherosclerosis. No adenopathy identified. Other:  No ascites or focal fluid collections identified. Musculoskeletal: No abnormal signal from within the bone marrow. IMPRESSION: 1. Diminished exam detail due to motion artifact. 2. Common bile duct dilatation and intrahepatic biliary dilatation is identified. Within the common bile duct there is a stone which measures 1.3 x 0.6 cm. 3. At the level of the ampulla there is an indeterminate filling defect which extends into the lumen of the duodenum measuring approximately 1.5 cm. Etiology indeterminate. Differential considerations include edema and inflammation secondary to impaction of stone at the ampulla versus ampullary neoplasm. Correlation with ERCP 4. Small liver and kidney cysts. 5.  Aortic Atherosclerosis (ICD10-I70.0). Electronically Signed   By: Kerby Moors M.D.   On: 03/09/2017 09:13   Mr Abdomen With Mrcp W Contrast  Result Date: 03/09/2017 CLINICAL DATA:  Pain.  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 EXAM: MRI ABDOMEN WITHOUT CONTRAST  (INCLUDING MRCP) TECHNIQUE: Multiplanar multisequence MR imaging of the abdomen was performed. Heavily T2-weighted images of the biliary and pancreatic ducts were obtained, and three-dimensional MRCP images were rendered by post processing. COMPARISON:  03/08/2017 FINDINGS: Lower chest: No acute findings. Hepatobiliary: Exam detail diminished secondary to motion artifact. Several small cysts are identified within the left lobe of liver. These measure up to 8 mm. Moderate intrahepatic biliary dilatation. The common bile duct is increased in caliber measuring 1.3 cm. There is a filling defect within the common bile duct which measures 1.3 cm in length and  has a diameter of 0.6 cm, image 4 of  series 4. At the level of the ampulla there is a filling defect extending into the lumen of the duodenum which measures approximately 1.5 cm, image 50 of series 10. This is also seen on the 5 minutes delayed postcontrast images measuring 1.5 cm, image 57 of series 29. Pancreas: No pancreatic inflammation. No main duct dilatation identified. Spleen:  Within normal limits in size and appearance. Adrenals/Urinary Tract: The adrenal glands appear normal. Small bilateral kidney cysts. No mass or hydronephrosis. Stomach/Bowel: Visualized portions within the abdomen are unremarkable. Vascular/Lymphatic: Aortic atherosclerosis. No adenopathy identified. Other:  No ascites or focal fluid collections identified. Musculoskeletal: No abnormal signal from within the bone marrow. IMPRESSION: 1. Diminished exam detail due to motion artifact. 2. Common bile duct dilatation and intrahepatic biliary dilatation is identified. Within the common bile duct there is a stone which measures 1.3 x 0.6 cm. 3. At the level of the ampulla there is an indeterminate filling defect which extends into the lumen of the duodenum measuring approximately 1.5 cm. Etiology indeterminate. Differential considerations include edema and inflammation secondary to impaction of stone at the ampulla versus ampullary neoplasm. Correlation with ERCP 4. Small liver and kidney cysts. 5.  Aortic Atherosclerosis (ICD10-I70.0). Electronically Signed   By: Kerby Moors M.D.   On: 03/09/2017 09:13    Time Spent in minutes  25   Laurita Peron M.D on 03/10/2017 at 9:11 AM  Between 7am to 7pm - Pager - [phone]  After 7pm go to [email or web address] - password [facility]  [facility] -  Office  [phone]

## 2017-03-10 NOTE — Anesthesia Preprocedure Evaluation (Addendum)
Anesthesia Evaluation  Patient identified by MRN, date of birth, ID band Patient awake    Reviewed: Allergy & Precautions, NPO status , Patient's Chart, lab work & pertinent test results  Airway Mallampati: II  TM Distance: >3 FB     Dental  (+) Teeth Intact, Poor Dentition, Implants, Dental Advisory Given   Pulmonary Current Smoker,   recent diagnosis of influenza A  Prod cough          Cardiovascular negative cardio ROS   Rhythm:Regular Rate:Tachycardia     Neuro/Psych negative neurological ROS  negative psych ROS   GI/Hepatic negative GI ROS, Neg liver ROS,   Endo/Other  negative endocrine ROS  Renal/GU negative Renal ROS     Musculoskeletal   Abdominal   Peds  Hematology   Anesthesia Other Findings   Reproductive/Obstetrics                             Anesthesia Physical Anesthesia Plan  ASA: II and emergent  Anesthesia Plan: General   Post-op Pain Management:    Induction: Intravenous, Rapid sequence and Cricoid pressure planned  PONV Risk Score and Plan:   Airway Management Planned: Oral ETT  Additional Equipment:   Intra-op Plan:   Post-operative Plan: Extubation in OR  Informed Consent: I have reviewed the patients History and Physical, chart, labs and discussed the procedure including the risks, benefits and alternatives for the proposed anesthesia with the patient or authorized representative who has indicated his/her understanding and acceptance.     Plan Discussed with:   Anesthesia Plan Comments:      Anesthesia Quick Evaluation

## 2017-03-11 DIAGNOSIS — A0472 Enterocolitis due to Clostridium difficile, not specified as recurrent: Secondary | ICD-10-CM

## 2017-03-11 DIAGNOSIS — E871 Hypo-osmolality and hyponatremia: Secondary | ICD-10-CM

## 2017-03-11 DIAGNOSIS — K8050 Calculus of bile duct without cholangitis or cholecystitis without obstruction: Secondary | ICD-10-CM

## 2017-03-11 LAB — COMPREHENSIVE METABOLIC PANEL
ALK PHOS: 64 U/L (ref 38–126)
ALT: 40 U/L (ref 14–54)
AST: 39 U/L (ref 15–41)
Albumin: 3.2 g/dL — ABNORMAL LOW (ref 3.5–5.0)
Anion gap: 15 (ref 5–15)
BUN: 6 mg/dL (ref 6–20)
CALCIUM: 8.5 mg/dL — AB (ref 8.9–10.3)
CHLORIDE: 100 mmol/L — AB (ref 101–111)
CO2: 22 mmol/L (ref 22–32)
CREATININE: 0.53 mg/dL (ref 0.44–1.00)
GFR calc Af Amer: >60 mL/min (ref >60)
Glucose, Bld: 81 mg/dL (ref 65–99)
Potassium: 2.9 mmol/L — ABNORMAL LOW (ref 3.5–5.1)
Sodium: 137 mmol/L (ref 135–145)
Total Bilirubin: 1 mg/dL (ref 0.3–1.2)
Total Protein: 6.7 g/dL (ref 6.5–8.1)

## 2017-03-11 LAB — CBC
HEMATOCRIT: 42.3 % (ref 36.0–46.0)
Hemoglobin: 14.2 g/dL (ref 12.0–15.0)
MCH: 29.6 pg (ref 26.0–34.0)
MCHC: 33.6 g/dL (ref 30.0–36.0)
MCV: 88.1 fL (ref 78.0–100.0)
PLATELETS: 400 10*3/uL (ref 150–400)
RBC: 4.8 MIL/uL (ref 3.87–5.11)
RDW: 14.1 % (ref 11.5–15.5)
WBC: 9.7 10*3/uL (ref 4.0–10.5)

## 2017-03-11 LAB — LIPASE, BLOOD: LIPASE: 35 U/L (ref 11–51)

## 2017-03-11 LAB — AMYLASE: AMYLASE: 83 U/L (ref 28–100)

## 2017-03-11 MED ORDER — VANCOMYCIN 50 MG/ML ORAL SOLUTION: 125.0000 mg | Freq: Four times a day (QID) | ORAL | 0 refills | Status: DC

## 2017-03-11 MED ORDER — ONDANSETRON HCL 4 MG PO TABS: 4.0000 mg | ORAL_TABLET | Freq: Three times a day (TID) | ORAL | 0 refills | Status: DC | PRN

## 2017-03-11 MED ORDER — POTASSIUM CHLORIDE CRYS ER 20 MEQ PO TBCR
40.0000 meq | EXTENDED_RELEASE_TABLET | Freq: Once | ORAL | Status: DC
  Filled 2017-03-11: qty 2

## 2017-03-11 NOTE — Discharge Summary (Addendum)
Physician Discharge Summary  Kathryn Meyers VEH:[id number] DOB: 10/05/1945 DOA: 03/08/2017  PCP: Deloria Lair., MD  Admit date: 03/08/2017 Discharge date: 03/11/2017  Admitted From: Home Disposition:  Home   Recommendations for Outpatient Follow-up:  1. Follow up with PCP in 1-2 weeks 2. Please obtain CMP/CBC in one week    Discharge Condition: Stable CODE STATUS: FULL Diet recommendation: Heart Healthy   Brief/Interim Summary: 72 year old female with history of recently diagnosed influenza A but did not take Tamiflu presented with diarrhea since last 5-6 days with associated nausea.  Patient has history of C. difficile in the past (took Flagyl which made her sick).  Patient denies any fevers, chills, vomiting, has mild lower abdominal pain.  Labs showed mild transaminitis with normal bilirubin.  CT of the abdomen showed abnormal intra-and extrahepatic biliary ductal dilatation with questionable enhancing lesion in the ampulla concerning for a partially obstructing neoplasm.  Also had mild colitis in the cecum/ascending colon.  MRCP was performed and GI consult was obtained.  MRCP showed CBD dilated with intra hepatic biliary ductal dilatation with CBD stone.  The patient underwent ERCP with sphincterotomy and stone extraction 03/10/2017.  Her LFTs trended down.  Her diet was advanced which she tolerated.  Her C. difficile was positive.  She was started on oral vancomycin.  The patient's diarrhea improved with decreased volume and increasing formed stool.  The patient will be discharged home with 7 more days of vancomycin po.    Discharge Diagnoses:  C. difficile colitis -Improving on oral vancomycin -Discharge home with 7 more days to complete 10 days of therapy  Choledocholithiasis with transaminitis.  MRCP shows intrahepatic and CBD dilatation with an impacted stone in the CBD measuring 1.3X 0.6 cm.  There is filling defect at the ampulla which could be due to edema from  inflammation with impacted stone versus ampullary neoplasm.  GI consult appreciated.  -03/10/2017--ERCP with sphincterotomy and stone extraction -LFTs trended down--normalized at the time of discharge -Diet advanced which the patient tolerated  Influenza A -Patient did not take Tamiflu -Symptomatically improved  Hypokalemia -Repleted  HTN -continue metoprolol succinate and aldactone    Discharge Instructions  Discharge Instructions    Diet - low sodium heart healthy   Complete by:  As directed    Increase activity slowly   Complete by:  As directed      Allergies as of 03/11/2017      Reactions   Nsaids Other (See Comments)   Rectal bleeding.   Cortisone Other (See Comments)   Dizzy/flush/rapid heart beat.   Demerol [meperidine] Nausea And Vomiting      Medication List    STOP taking these medications   loperamide 2 MG tablet Commonly known as:  IMODIUM A-D   ondansetron 4 MG disintegrating tablet Commonly known as:  ZOFRAN ODT     TAKE these medications   acetaminophen 500 MG tablet Commonly known as:  TYLENOL Take 500 mg by mouth every 6 (six) hours as needed for moderate pain.   Cholecalciferol 5000 units capsule Take 5,000 Units by mouth daily.   latanoprost 0.005 % ophthalmic solution Commonly known as:  XALATAN Place 1 drop into both eyes at bedtime.   metoprolol succinate 50 MG 24 hr tablet Commonly known as:  TOPROL-XL TAKE 1 TABLET 2 TIMES DAILY. TAKE WITH OR IMMEDIATELY FOLLOWING A MEAL.   ondansetron 4 MG tablet Commonly known as:  ZOFRAN Take 1 tablet (4 mg total) by mouth every 8 (eight) hours as  needed for nausea or vomiting.   promethazine 12.5 MG tablet Commonly known as:  PHENERGAN Take 1 tablet (12.5 mg total) by mouth every 6 (six) hours as needed for nausea or vomiting.   spironolactone 25 MG tablet Commonly known as:  ALDACTONE Take 50 mg by mouth 2 (two) times daily.   vancomycin 50 mg/mL oral solution Commonly known as:   VANCOCIN Take 2.5 mLs (125 mg total) by mouth every 6 (six) hours. X 7 days      Follow-up Information    Deloria Lair., MD In 2 days.   Specialty:  Family Medicine Why:  for recheck Contact information: [address] [place]  [address] [phone]          Allergies  Allergen Reactions  . Nsaids Other (See Comments)    Rectal bleeding.  . Cortisone Other (See Comments)    Dizzy/flush/rapid heart beat.  . Demerol [Meperidine] Nausea And Vomiting    Consultations:  GI--Rehman   Procedures/Studies: Dg Chest 2 View  Result Date: 03/08/2017 CLINICAL DATA:  Dyspnea x2 years. Diagnosed with flu on Monday. Diarrhea and vomiting. Everyday smoker. EXAM: CHEST  2 VIEW COMPARISON:  03/06/2017 FINDINGS: The heart size and mediastinal contours are within normal limits. Aortic atherosclerosis along the transverse portion without aneurysmal dilatation. Chronic bilateral interstitial prominence consistent with smoking related bronchitic change. No alveolar consolidation or CHF. No effusion or pneumothorax. The visualized skeletal structures are nonacute. IMPRESSION: Chronic smoking related bronchitic change of the lungs. Electronically Signed   By: Ashley Royalty M.D.   On: 03/08/2017 18:31   Dg Chest 2 View  Result Date: 03/06/2017 CLINICAL DATA:  Productive cough and intermittent shortness of breath for the past week associated with fever and body aches. Patient also reports chest wall discomfort. Current smoker. History of bronchitis. EXAM: CHEST  2 VIEW COMPARISON:  Chest x-ray of February 09, 2014 and CT scan of the chest of May 19, 2015. FINDINGS: The lungs are well-expanded. The interstitial markings are coarse diffusely. There are patchy densities in the lingula which are stable as well. There is no alveolar infiltrate. There is no pleural effusion or pneumothorax. The heart and pulmonary vascularity are normal. There calcification in the wall of the aortic arch. There is  moderate levocurvature centered in the midthoracic spine with multilevel degenerative disc disease. IMPRESSION: COPD. Stable scarring in the lingula. No alveolar pneumonia nor CHF. Thoracic aortic atherosclerosis. Electronically Signed   By: Euretha Najarro  [place] M.D.   On: 03/06/2017 13:46   Ct Abdomen Pelvis W Contrast  Result Date: 03/08/2017 CLINICAL DATA:  Diagnosis of flu.  Vomiting. EXAM: CT ABDOMEN AND PELVIS WITH CONTRAST TECHNIQUE: Multidetector CT imaging of the abdomen and pelvis was performed using the standard protocol following bolus administration of intravenous contrast. CONTRAST:  163mL ISOVUE-300 IOPAMIDOL (ISOVUE-300) INJECTION 61% COMPARISON:  None. FINDINGS: Lower chest: No acute abnormality. Hepatobiliary: Few simple cysts are seen, unchanged from prior visualization on CT. No visible gallstones. There is intrahepatic and extrahepatic biliary ductal dilatation, with common bile duct measuring 13 mm. There is an enhancing lesion at the ampulla, roughly 1.5 cm in diameter, projecting into the duodenum, concern for partially obstructing neoplasm. Consider MRCP or ERCP for further evaluation. See series 2, image 33, series 4, image 44. Pancreas: Unremarkable. No pancreatic ductal dilatation or surrounding inflammatory changes. Spleen: Normal in size without focal abnormality. Adrenals/Urinary Tract: Adrenal glands are unremarkable. Kidneys are normal, without renal calculi, focal lesion, or hydronephrosis. Bladder is unremarkable. Stomach/Bowel:  Although motion degraded, there is fluid in some wall thickening in the cecum and ascending colon consistent with enteritis. No bowel obstruction. No localized perforation. No appendiceal inflammation. Vascular/Lymphatic: Aortic atherosclerosis. No enlarged lymph nodes. Reproductive: Uterus and bilateral adnexa are unremarkable. Other: No abdominal wall hernia or abnormality. No abdominopelvic ascites. Musculoskeletal: No acute or significant osseous findings.  IMPRESSION: Abnormal intrahepatic and extrahepatic biliary ductal dilatation associated with a suspected enhancing lesion in the ampulla; concern for partially obstructing neoplasm. Consider MRCP or ERCP for further evaluation. Suspected mild colitis in the cecum/ascending colon. No bowel obstruction. Electronically Signed   By: Staci Righter M.D.   On: 03/08/2017 18:33   Mr 3d Recon At Scanner  Result Date: 03/09/2017 CLINICAL DATA:  Pain.  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 EXAM: MRI ABDOMEN WITHOUT CONTRAST  (INCLUDING MRCP) TECHNIQUE: Multiplanar multisequence MR imaging of the abdomen was performed. Heavily T2-weighted images of the biliary and pancreatic ducts were obtained, and three-dimensional MRCP images were rendered by post processing. COMPARISON:  03/08/2017 FINDINGS: Lower chest: No acute findings. Hepatobiliary: Exam detail diminished secondary to motion artifact. Several small cysts are identified within the left lobe of liver. These measure up to 8 mm. Moderate intrahepatic biliary dilatation. The common bile duct is increased in caliber measuring 1.3 cm. There is a filling defect within the common bile duct which measures 1.3 cm in length and has a diameter of 0.6 cm, image 4 of series 4. At the level of the ampulla there is a filling defect extending into the lumen of the duodenum which measures approximately 1.5 cm, image 50 of series 10. This is also seen on the 5 minutes delayed postcontrast images measuring 1.5 cm, image 57 of series 29. Pancreas: No pancreatic inflammation. No main duct dilatation identified. Spleen:  Within normal limits in size and appearance. Adrenals/Urinary Tract: The adrenal glands appear normal. Small bilateral kidney cysts. No mass or hydronephrosis. Stomach/Bowel: Visualized portions within the abdomen are unremarkable. Vascular/Lymphatic: Aortic atherosclerosis. No adenopathy identified. Other:  No ascites or focal fluid collections identified.  Musculoskeletal: No abnormal signal from within the bone marrow. IMPRESSION: 1. Diminished exam detail due to motion artifact. 2. Common bile duct dilatation and intrahepatic biliary dilatation is identified. Within the common bile duct there is a stone which measures 1.3 x 0.6 cm. 3. At the level of the ampulla there is an indeterminate filling defect which extends into the lumen of the duodenum measuring approximately 1.5 cm. Etiology indeterminate. Differential considerations include edema and inflammation secondary to impaction of stone at the ampulla versus ampullary neoplasm. Correlation with ERCP 4. Small liver and kidney cysts. 5.  Aortic Atherosclerosis (ICD10-I70.0). Electronically Signed   By: Kerby Moors M.D.   On: 03/09/2017 09:13   Dg Ercp  Result Date: 03/10/2017 CLINICAL DATA:  72 year old female with a history of choledocholithiasis EXAM: ERCP TECHNIQUE: Multiple spot images obtained with the fluoroscopic device and submitted for interpretation post-procedure. FLUOROSCOPY TIME:  Fluoroscopy Time:  Op note COMPARISON:  None. FINDINGS: Limited images during ERCP image demonstrates endoscope projecting over the upper abdomen. Subsequently there is cannulation of the ampulla and retrograde infusion of contrast, with evidence of filling defect within the common bile duct. There is then deployment of a retrieval basket, with the final image demonstrating minimal filling defect/debris in the distal common bile duct. IMPRESSION: Limited images during ERCP demonstrates treatment of choledocholithiasis, with deployment of a retrieval basket. Please refer to the dictated operative report for full details of intraoperative findings  and procedure. Electronically Signed   By: Corrie Mckusick D.O.   On: 03/10/2017 17:07   Mr Abdomen With Mrcp W Contrast  Result Date: 03/09/2017 CLINICAL DATA:  Pain.  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 EXAM: MRI ABDOMEN WITHOUT CONTRAST  (INCLUDING MRCP) TECHNIQUE:  Multiplanar multisequence MR imaging of the abdomen was performed. Heavily T2-weighted images of the biliary and pancreatic ducts were obtained, and three-dimensional MRCP images were rendered by post processing. COMPARISON:  03/08/2017 FINDINGS: Lower chest: No acute findings. Hepatobiliary: Exam detail diminished secondary to motion artifact. Several small cysts are identified within the left lobe of liver. These measure up to 8 mm. Moderate intrahepatic biliary dilatation. The common bile duct is increased in caliber measuring 1.3 cm. There is a filling defect within the common bile duct which measures 1.3 cm in length and has a diameter of 0.6 cm, image 4 of series 4. At the level of the ampulla there is a filling defect extending into the lumen of the duodenum which measures approximately 1.5 cm, image 50 of series 10. This is also seen on the 5 minutes delayed postcontrast images measuring 1.5 cm, image 57 of series 29. Pancreas: No pancreatic inflammation. No main duct dilatation identified. Spleen:  Within normal limits in size and appearance. Adrenals/Urinary Tract: The adrenal glands appear normal. Small bilateral kidney cysts. No mass or hydronephrosis. Stomach/Bowel: Visualized portions within the abdomen are unremarkable. Vascular/Lymphatic: Aortic atherosclerosis. No adenopathy identified. Other:  No ascites or focal fluid collections identified. Musculoskeletal: No abnormal signal from within the bone marrow. IMPRESSION: 1. Diminished exam detail due to motion artifact. 2. Common bile duct dilatation and intrahepatic biliary dilatation is identified. Within the common bile duct there is a stone which measures 1.3 x 0.6 cm. 3. At the level of the ampulla there is an indeterminate filling defect which extends into the lumen of the duodenum measuring approximately 1.5 cm. Etiology indeterminate. Differential considerations include edema and inflammation secondary to impaction of stone at the ampulla  versus ampullary neoplasm. Correlation with ERCP 4. Small liver and kidney cysts. 5.  Aortic Atherosclerosis (ICD10-I70.0). Electronically Signed   By: Kerby Moors M.D.   On: 03/09/2017 09:13        Discharge Exam: Vitals:   03/10/17 2100 03/11/17 0500  BP: (!) 141/69 140/60  Pulse: 91 93  Resp: 18 18  Temp: 98.2 F (36.8 C) 98.5 F (36.9 C)  SpO2: 97% 95%   Vitals:   03/10/17 1630 03/10/17 1807 03/10/17 2100 03/11/17 0500  BP: (!) 155/79 (!) 147/71 (!) 141/69 140/60  Pulse: (!) 110 98 91 93  Resp: 19 18 18 18   Temp:  97.8 F (36.6 C) 98.2 F (36.8 C) 98.5 F (36.9 C)  TempSrc:  Oral Oral Oral  SpO2: 92% 95% 97% 95%  Weight:      Height:        General: Pt is alert, awake, not in acute distress Cardiovascular: RRR, S1/S2 +, no rubs, no gallops Respiratory: CTA bilaterally, no wheezing, no rhonchi Abdominal: Soft, NT, ND, bowel sounds + Extremities: no edema, no cyanosis   The results of significant diagnostics from this hospitalization (including imaging, microbiology, ancillary and laboratory) are listed below for reference.    Significant Diagnostic Studies: Dg Chest 2 View  Result Date: 03/08/2017 CLINICAL DATA:  Dyspnea x2 years. Diagnosed with flu on Monday. Diarrhea and vomiting. Everyday smoker. EXAM: CHEST  2 VIEW COMPARISON:  03/06/2017 FINDINGS: The heart size and mediastinal contours are within normal  limits. Aortic atherosclerosis along the transverse portion without aneurysmal dilatation. Chronic bilateral interstitial prominence consistent with smoking related bronchitic change. No alveolar consolidation or CHF. No effusion or pneumothorax. The visualized skeletal structures are nonacute. IMPRESSION: Chronic smoking related bronchitic change of the lungs. Electronically Signed   By: Ashley Royalty M.D.   On: 03/08/2017 18:31   Dg Chest 2 View  Result Date: 03/06/2017 CLINICAL DATA:  Productive cough and intermittent shortness of breath for the past week  associated with fever and body aches. Patient also reports chest wall discomfort. Current smoker. History of bronchitis. EXAM: CHEST  2 VIEW COMPARISON:  Chest x-ray of February 09, 2014 and CT scan of the chest of May 19, 2015. FINDINGS: The lungs are well-expanded. The interstitial markings are coarse diffusely. There are patchy densities in the lingula which are stable as well. There is no alveolar infiltrate. There is no pleural effusion or pneumothorax. The heart and pulmonary vascularity are normal. There calcification in the wall of the aortic arch. There is moderate levocurvature centered in the midthoracic spine with multilevel degenerative disc disease. IMPRESSION: COPD. Stable scarring in the lingula. No alveolar pneumonia nor CHF. Thoracic aortic atherosclerosis. Electronically Signed   By: Darica Goren  [place] M.D.   On: 03/06/2017 13:46   Ct Abdomen Pelvis W Contrast  Result Date: 03/08/2017 CLINICAL DATA:  Diagnosis of flu.  Vomiting. EXAM: CT ABDOMEN AND PELVIS WITH CONTRAST TECHNIQUE: Multidetector CT imaging of the abdomen and pelvis was performed using the standard protocol following bolus administration of intravenous contrast. CONTRAST:  164mL ISOVUE-300 IOPAMIDOL (ISOVUE-300) INJECTION 61% COMPARISON:  None. FINDINGS: Lower chest: No acute abnormality. Hepatobiliary: Few simple cysts are seen, unchanged from prior visualization on CT. No visible gallstones. There is intrahepatic and extrahepatic biliary ductal dilatation, with common bile duct measuring 13 mm. There is an enhancing lesion at the ampulla, roughly 1.5 cm in diameter, projecting into the duodenum, concern for partially obstructing neoplasm. Consider MRCP or ERCP for further evaluation. See series 2, image 33, series 4, image 44. Pancreas: Unremarkable. No pancreatic ductal dilatation or surrounding inflammatory changes. Spleen: Normal in size without focal abnormality. Adrenals/Urinary Tract: Adrenal glands are unremarkable. Kidneys  are normal, without renal calculi, focal lesion, or hydronephrosis. Bladder is unremarkable. Stomach/Bowel: Although motion degraded, there is fluid in some wall thickening in the cecum and ascending colon consistent with enteritis. No bowel obstruction. No localized perforation. No appendiceal inflammation. Vascular/Lymphatic: Aortic atherosclerosis. No enlarged lymph nodes. Reproductive: Uterus and bilateral adnexa are unremarkable. Other: No abdominal wall hernia or abnormality. No abdominopelvic ascites. Musculoskeletal: No acute or significant osseous findings. IMPRESSION: Abnormal intrahepatic and extrahepatic biliary ductal dilatation associated with a suspected enhancing lesion in the ampulla; concern for partially obstructing neoplasm. Consider MRCP or ERCP for further evaluation. Suspected mild colitis in the cecum/ascending colon. No bowel obstruction. Electronically Signed   By: Staci Righter M.D.   On: 03/08/2017 18:33   Mr 3d Recon At Scanner  Result Date: 03/09/2017 CLINICAL DATA:  Pain.  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 EXAM: MRI ABDOMEN WITHOUT CONTRAST  (INCLUDING MRCP) TECHNIQUE: Multiplanar multisequence MR imaging of the abdomen was performed. Heavily T2-weighted images of the biliary and pancreatic ducts were obtained, and three-dimensional MRCP images were rendered by post processing. COMPARISON:  03/08/2017 FINDINGS: Lower chest: No acute findings. Hepatobiliary: Exam detail diminished secondary to motion artifact. Several small cysts are identified within the left lobe of liver. These measure up to 8 mm. Moderate intrahepatic biliary dilatation. The common bile  duct is increased in caliber measuring 1.3 cm. There is a filling defect within the common bile duct which measures 1.3 cm in length and has a diameter of 0.6 cm, image 4 of series 4. At the level of the ampulla there is a filling defect extending into the lumen of the duodenum which measures approximately 1.5 cm, image  50 of series 10. This is also seen on the 5 minutes delayed postcontrast images measuring 1.5 cm, image 57 of series 29. Pancreas: No pancreatic inflammation. No main duct dilatation identified. Spleen:  Within normal limits in size and appearance. Adrenals/Urinary Tract: The adrenal glands appear normal. Small bilateral kidney cysts. No mass or hydronephrosis. Stomach/Bowel: Visualized portions within the abdomen are unremarkable. Vascular/Lymphatic: Aortic atherosclerosis. No adenopathy identified. Other:  No ascites or focal fluid collections identified. Musculoskeletal: No abnormal signal from within the bone marrow. IMPRESSION: 1. Diminished exam detail due to motion artifact. 2. Common bile duct dilatation and intrahepatic biliary dilatation is identified. Within the common bile duct there is a stone which measures 1.3 x 0.6 cm. 3. At the level of the ampulla there is an indeterminate filling defect which extends into the lumen of the duodenum measuring approximately 1.5 cm. Etiology indeterminate. Differential considerations include edema and inflammation secondary to impaction of stone at the ampulla versus ampullary neoplasm. Correlation with ERCP 4. Small liver and kidney cysts. 5.  Aortic Atherosclerosis (ICD10-I70.0). Electronically Signed   By: Kerby Moors M.D.   On: 03/09/2017 09:13   Dg Ercp  Result Date: 03/10/2017 CLINICAL DATA:  72 year old female with a history of choledocholithiasis EXAM: ERCP TECHNIQUE: Multiple spot images obtained with the fluoroscopic device and submitted for interpretation post-procedure. FLUOROSCOPY TIME:  Fluoroscopy Time:  Op note COMPARISON:  None. FINDINGS: Limited images during ERCP image demonstrates endoscope projecting over the upper abdomen. Subsequently there is cannulation of the ampulla and retrograde infusion of contrast, with evidence of filling defect within the common bile duct. There is then deployment of a retrieval basket, with the final image  demonstrating minimal filling defect/debris in the distal common bile duct. IMPRESSION: Limited images during ERCP demonstrates treatment of choledocholithiasis, with deployment of a retrieval basket. Please refer to the dictated operative report for full details of intraoperative findings and procedure. Electronically Signed   By: Corrie Mckusick D.O.   On: 03/10/2017 17:07   Mr Abdomen With Mrcp W Contrast  Result Date: 03/09/2017 CLINICAL DATA:  Pain.  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 EXAM: MRI ABDOMEN WITHOUT CONTRAST  (INCLUDING MRCP) TECHNIQUE: Multiplanar multisequence MR imaging of the abdomen was performed. Heavily T2-weighted images of the biliary and pancreatic ducts were obtained, and three-dimensional MRCP images were rendered by post processing. COMPARISON:  03/08/2017 FINDINGS: Lower chest: No acute findings. Hepatobiliary: Exam detail diminished secondary to motion artifact. Several small cysts are identified within the left lobe of liver. These measure up to 8 mm. Moderate intrahepatic biliary dilatation. The common bile duct is increased in caliber measuring 1.3 cm. There is a filling defect within the common bile duct which measures 1.3 cm in length and has a diameter of 0.6 cm, image 4 of series 4. At the level of the ampulla there is a filling defect extending into the lumen of the duodenum which measures approximately 1.5 cm, image 50 of series 10. This is also seen on the 5 minutes delayed postcontrast images measuring 1.5 cm, image 57 of series 29. Pancreas: No pancreatic inflammation. No main duct dilatation identified. Spleen:  Within  normal limits in size and appearance. Adrenals/Urinary Tract: The adrenal glands appear normal. Small bilateral kidney cysts. No mass or hydronephrosis. Stomach/Bowel: Visualized portions within the abdomen are unremarkable. Vascular/Lymphatic: Aortic atherosclerosis. No adenopathy identified. Other:  No ascites or focal fluid collections  identified. Musculoskeletal: No abnormal signal from within the bone marrow. IMPRESSION: 1. Diminished exam detail due to motion artifact. 2. Common bile duct dilatation and intrahepatic biliary dilatation is identified. Within the common bile duct there is a stone which measures 1.3 x 0.6 cm. 3. At the level of the ampulla there is an indeterminate filling defect which extends into the lumen of the duodenum measuring approximately 1.5 cm. Etiology indeterminate. Differential considerations include edema and inflammation secondary to impaction of stone at the ampulla versus ampullary neoplasm. Correlation with ERCP 4. Small liver and kidney cysts. 5.  Aortic Atherosclerosis (ICD10-I70.0). Electronically Signed   By: Kerby Moors M.D.   On: 03/09/2017 09:13     Microbiology: Recent Results (from the past 240 hour(s))  C difficile quick scan w PCR reflex     Status: Abnormal   Collection Time: 03/08/17  5:24 PM  Result Value Ref Range Status   C Diff antigen POSITIVE (A) NEGATIVE Final   C Diff toxin NEGATIVE NEGATIVE Final   C Diff interpretation Results are indeterminate. See PCR results.  Final    Comment: Performed at [facility], [address], [place], [place] [address]  C. Diff by PCR, Reflexed     Status: Abnormal   Collection Time: 03/08/17  5:24 PM  Result Value Ref Range Status   Toxigenic C. Difficile by PCR POSITIVE (A) NEGATIVE Final    Comment: Positive for toxigenic C. difficile with little to no toxin production. Only treat if clinical presentation suggests symptomatic illness. Performed at [facility], [place] [address], [place], [place] [address]      Labs: Basic Metabolic Panel: Recent Labs  Lab 03/06/17 1252 03/08/17 1159 03/09/17 0400 03/10/17 0835 03/11/17 0628  NA 134* 138 140 139 137  K 3.3* 3.5 3.3* 3.0* 2.9*  CL 105 104 109 107 100*  CO2 15* 21* 20* 21* 22  GLUCOSE 125* 108* 85 83 81  BUN 23* 10 9 6 6   CREATININE 1.16* 0.81 0.73 0.55 0.53  CALCIUM  8.4* 8.8* 8.2* 8.4* 8.5*   Liver Function Tests: Recent Labs  Lab 03/06/17 1252 03/08/17 1159 03/09/17 0400 03/10/17 0835 03/11/17 0628  AST 70* 99* 74* 47* 39  ALT 46 68* 60* 48 40  ALKPHOS 91 79 64 69 64  BILITOT 0.3 0.4 0.7 0.8 1.0  PROT 7.8 7.6 6.6 6.9 6.7  ALBUMIN 3.6 3.6 3.0* 3.2* 3.2*   Recent Labs  Lab 03/08/17 1159 03/11/17 0628  LIPASE 30 35  AMYLASE  --  83   No results for input(s): AMMONIA in the last 168 hours. CBC: Recent Labs  Lab 03/06/17 1252 03/08/17 1159 03/09/17 0400 03/11/17 0628  WBC 6.1 7.8 7.2 9.7  HGB 16.8* 15.6* 14.1 14.2  HCT 50.4* 45.9 42.6 42.3  MCV 88.6 87.4 88.2 88.1  PLT 276 341 329 400   Cardiac Enzymes: No results for input(s): CKTOTAL, CKMB, CKMBINDEX, TROPONINI in the last 168 hours. BNP: Invalid input(s): POCBNP CBG: No results for input(s): GLUCAP in the last 168 hours.  Time coordinating discharge:  Greater than 30 minutes  Signed:  Orson Eva, DO [facility] Pager: [phone] 03/11/2017, 12:40 PM

## 2017-03-11 NOTE — Progress Notes (Signed)
Removed both IVs-clean, dry, intact. Reviewed d/c paperwork with pt and husband. Answered all questions and reviewed new meds. Wheeled stable pt and belongings to short stay entrance where she got in the car with husband.

## 2017-03-13 LAB — H. PYLORI ANTIBODY, IGG: H Pylori IgG: <0.8 Index Value (ref 0.00–0.79)

## 2017-03-13 NOTE — Addendum Note (Signed)
Addendum  created 03/13/17 0945 by Ollen Bowl, CRNA   Intraprocedure Attestations filed

## 2017-03-14 ENCOUNTER — Encounter (HOSPITAL_COMMUNITY): Payer: Self-pay | Admitting: Internal Medicine

## 2017-05-03 ENCOUNTER — Encounter (INDEPENDENT_AMBULATORY_CARE_PROVIDER_SITE_OTHER): Payer: Self-pay | Admitting: *Deleted

## 2017-05-10 ENCOUNTER — Telehealth: Payer: Self-pay | Admitting: Cardiology

## 2017-05-10 MED ORDER — METOPROLOL TARTRATE 50 MG PO TABS: 50.0000 mg | ORAL_TABLET | Freq: Three times a day (TID) | ORAL | Status: DC

## 2017-05-10 NOTE — Telephone Encounter (Signed)
Patient called stating that she is on the time release of metoprolol succinate (TOPROL-XL) 50 MG 24 hr tablet  she has a bottle of the metoprolol succinate (TOPROL-XL) 50 MG 24 hr table (regular) is it ok to take this?

## 2017-05-10 NOTE — Telephone Encounter (Signed)
She could go back to the short acting metoprolol 50 mg 3 times a day if that is just effective for her in terms of symptoms.

## 2017-05-10 NOTE — Telephone Encounter (Signed)
Patient notified

## 2017-05-10 NOTE — Telephone Encounter (Signed)
Stated that she had a whole bottle of the Metoprolol tart 50mg  twice a day, could do up to three times per day if needed.  Stated that she did have a stone in her bile duct - had removed on 03/10/2017 & put stent in there.  Questioned if that could have caused some issues with her heart.  Metoprolol tart was changed to long acting Toprol XL 50mg  twice a day due to her monitor results to help control her palpitations a little more.  Stated that she could not tell any difference with either of those medications.  But she does think that maybe the infection could have contributed to her palpitations.  Patient is questioning going back to short acting since she had a whole bottle of these.  Message fwd to provider.

## 2017-05-11 ENCOUNTER — Ambulatory Visit (INDEPENDENT_AMBULATORY_CARE_PROVIDER_SITE_OTHER): Payer: Medicare Other | Admitting: Internal Medicine

## 2017-05-12 ENCOUNTER — Ambulatory Visit (INDEPENDENT_AMBULATORY_CARE_PROVIDER_SITE_OTHER): Payer: Medicare Other | Admitting: Internal Medicine

## 2017-05-16 ENCOUNTER — Ambulatory Visit (INDEPENDENT_AMBULATORY_CARE_PROVIDER_SITE_OTHER): Payer: Medicare Other | Admitting: Internal Medicine

## 2017-05-16 ENCOUNTER — Encounter (INDEPENDENT_AMBULATORY_CARE_PROVIDER_SITE_OTHER): Payer: Self-pay | Admitting: Internal Medicine

## 2017-05-16 VITALS — BP 140/80 | HR 64 | Temp 97.4°F | Ht <= 58 in | Wt 141.0 lb

## 2017-05-16 DIAGNOSIS — K8050 Calculus of bile duct without cholangitis or cholecystitis without obstruction: Secondary | ICD-10-CM

## 2017-05-16 LAB — HEPATIC FUNCTION PANEL
AG RATIO: 1.4 (calc) (ref 1.0–2.5)
ALBUMIN MSPROF: 4.2 g/dL (ref 3.6–5.1)
ALT: 16 U/L (ref 6–29)
AST: 19 U/L (ref 10–35)
Alkaline phosphatase (APISO): 92 U/L (ref 33–130)
BILIRUBIN DIRECT: 0.1 mg/dL (ref 0.0–0.2)
BILIRUBIN TOTAL: 0.4 mg/dL (ref 0.2–1.2)
GLOBULIN: 3 g/dL (ref 1.9–3.7)
Indirect Bilirubin: 0.3 mg/dL (calc) (ref 0.2–1.2)
Total Protein: 7.2 g/dL (ref 6.1–8.1)

## 2017-05-16 NOTE — Progress Notes (Signed)
Subjective:    Patient ID: Kathryn Meyers, female    DOB: 1945-06-07, 72 y.o.   MRN: [id number]  HPI Here today for f/u. Admitted to [facility] in February for diarrhea and abdominal pain.  Had had epigastric pain on and off x 6 months.  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 Suspected mild colitis in the cecum/ascending colon. No bowel obstruction.    MRCP this 2/14./2019: 2. Common bile duct dilatation and intrahepatic biliary dilatation is identified. Within the common bile duct there is a stone which measures 1.3 x 0.6 cm. 3. At the level of the ampulla there is an indeterminate filling defect which extends into the lumen of the duodenum measuring approximately 1.5 cm. Etiology indeterminate. Differential considerations include edema and inflammation secondary to impaction of stone at the ampulla versus ampullary neoplasm.  She underwent and ERCP which revealed the major papilla appeared edematous, enlarged and ulcerated. The entire biliary tree was moderately dilated ,acquired. Choledocholithiasis was found. Single large stone.  Hx of C-diff wand was covered with Vancomycin 162m QID while in hospital. Her H.pylori was negative.  She tells me she is doing better. She occasionally has some nausea and some epigastric pain. She says she feels better She feels 70% better. Her appetite is okay. She says she is funny how she eats.  She is trying to eat healthy. She has a BM x 2 a days sometimes. 1st stool is formed and the second is loose. No melena or BRRB.  She has been taking Aleve x 2 a week for arthritis. She is not taking anymore Imodium.    Hepatic Function Latest Ref Rng & Units 03/11/2017 03/10/2017 03/09/2017  Total Protein 6.5 - 8.1 g/dL 6.7 6.9 6.6  Albumin 3.5 - 5.0 g/dL 3.2(L) 3.2(L) 3.0(L)  AST 15 - 41 U/L 39 47(H)  74(H)  ALT 14 - 54 U/L 40 48 60(H)  Alk Phosphatase 38 - 126 U/L 64 69 64  Total Bilirubin 0.3 - 1.2 mg/dL 1.0 0.8 0.7   CBC    Component Value Date/Time   WBC 9.7 03/11/2017 0628   RBC 4.80 03/11/2017 0628   HGB 14.2 03/11/2017 0628   HCT 42.3 03/11/2017 0628   PLT 400 03/11/2017 0628   MCV 88.1 03/11/2017 0628   MCH 29.6 03/11/2017 0628   MCHC 33.6 03/11/2017 0628   RDW 14.1 03/11/2017 0628   LYMPHSABS 3.7 05/01/2014 1518   MONOABS 0.7 05/01/2014 1518   EOSABS 0.3 05/01/2014 1518   BASOSABS 0.1 05/01/2014 1518      Review of Systems Past Medical History:  Diagnosis Date  . Abnormal EKG   . Glaucoma   . 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 Palpitations   . Shingles   . Sinus infection   . Sinus tachycardia    Mild persistent sinus tachycardia 2009 - on beta blocker    Past Surgical History:  Procedure Laterality Date  . BILIARY STENT PLACEMENT N/A 03/10/2017   Procedure: BILIARY STENT PLACEMENT;  Surgeon: RRogene Houston MD;  Location: [facility];  Service: Gastroenterology;  Laterality: N/A;  . CATARACT EXTRACTION    . CESAREAN SECTION     x 4  . CHOLECYSTECTOMY  1982  cholecystitis  . COLONOSCOPY N/A 06/19/2014   Procedure: COLONOSCOPY;  Surgeon: Rogene Houston, MD;  Location: [facility];  Service: Endoscopy;  Laterality: N/A;  730 - moved to 5/26 @ 12:00  . COLONOSCOPY N/A 09/26/2014   Procedure: COLONOSCOPY;  Surgeon: Rogene Houston, MD;  Location: [facility];  Service: Endoscopy;  Laterality: N/A;  1020 - moved to 11:15 - [name] to notify pt  . ERCP N/A 03/10/2017   Procedure: ENDOSCOPIC RETROGRADE CHOLANGIOPANCREATOGRAPHY (ERCP) With sphincterotomy and stone extraction.;  Surgeon: Rogene Houston, MD;  Location: [facility];  Service: Gastroenterology;  Laterality: N/A;  . Growth in ear removed      1994  . KNEE SURGERY    . rt knee replacement     2017  . SPHINCTEROTOMY N/A 03/10/2017   Procedure: SPHINCTEROTOMY;  Surgeon: Rogene Houston, MD;  Location: [facility];  Service: Gastroenterology;  Laterality: N/A;  . STONE EXTRACTION WITH BASKET N/A 03/10/2017   Procedure: STONE EXTRACTION WITH BASKET;  Surgeon: Rogene Houston, MD;  Location: [facility];  Service: Gastroenterology;  Laterality: N/A;  . TONSILLECTOMY     age 52    Allergies  Allergen Reactions  . Nsaids Other (See Comments)    Rectal bleeding.  . Cortisone Other (See Comments)    Dizzy/flush/rapid heart beat.  . Demerol [Meperidine] Nausea And Vomiting    Current Outpatient Medications on File Prior to Visit  Medication Sig Dispense Refill  . acetaminophen (TYLENOL) 500 MG tablet Take 500 mg by mouth every 6 (six) hours as needed for moderate pain.    . Cholecalciferol 5000 units capsule Take 5,000 Units by mouth daily.     Marland Kitchen latanoprost (XALATAN) 0.005 % ophthalmic solution Place 1 drop into both eyes at bedtime.    . metoprolol tartrate (LOPRESSOR) 50 MG tablet Take 1 tablet (50 mg total) by mouth 3 (three) times daily.    Marland Kitchen spironolactone (ALDACTONE) 25 MG tablet Take 50 mg by mouth 2 (two) times daily.      No current facility-administered medications on file prior to visit.          Objective:   Physical Exam Blood pressure 140/80, pulse 64, temperature (!) 97.4 F (36.3 C), height '4\' 10"'  (1.473 m), weight 141 lb (64 kg). Alert and oriented. Skin warm and dry. Oral mucosa is moist.   . Sclera anicteric, conjunctivae is pink. Thyroid not enlarged. No cervical lymphadenopathy. Lungs clear. Heart regular rate and rhythm.  Abdomen is soft. Bowel sounds are positive. No hepatomegaly. No abdominal masses felt. No tenderness.  No edema to lower extremities. Patient is         Assessment & Plan:  Repeat ERCP in May to remove stent.  Hepatic today.  Samples of Dexilant x 4 boxes given to patient.

## 2017-05-16 NOTE — Patient Instructions (Signed)
ERCP will be scheduled for May.

## 2017-05-30 ENCOUNTER — Encounter (INDEPENDENT_AMBULATORY_CARE_PROVIDER_SITE_OTHER): Payer: Self-pay | Admitting: *Deleted

## 2017-05-30 ENCOUNTER — Other Ambulatory Visit (INDEPENDENT_AMBULATORY_CARE_PROVIDER_SITE_OTHER): Payer: Self-pay | Admitting: *Deleted

## 2017-05-30 DIAGNOSIS — K8050 Calculus of bile duct without cholangitis or cholecystitis without obstruction: Secondary | ICD-10-CM | POA: Insufficient documentation

## 2017-06-22 NOTE — Patient Instructions (Signed)
Kathryn Meyers  06/22/2017     @PREFPERIOPPHARMACY @   Your procedure is scheduled on 06/30/2017.  Report to [facility] at 6:15 A.M.  Call this number if you have problems the morning of surgery:  [phone]   Remember:  No food or drink after midnight.      Take these medicines the morning of surgery with A SIP OF WATER Metoprolol, Valium    Do not wear jewelry, make-up or nail polish.  Do not wear lotions, powders, or perfumes, or deodorant.  Do not shave 48 hours prior to surgery.  Men may shave face and neck.  Do not bring valuables to the hospital.  [facility] is not responsible for any belongings or valuables.  Contacts, dentures or bridgework may not be worn into surgery.  Leave your suitcase in the car.  After surgery it may be brought to your room.  For patients admitted to the hospital, discharge time will be determined by your treatment team.  Patients discharged the day of surgery will not be allowed to drive home.    Please read over the following fact sheets that you were given. Anesthesia Post-op Instructions     PATIENT INSTRUCTIONS POST-ANESTHESIA  IMMEDIATELY FOLLOWING SURGERY:  Do not drive or operate machinery for the first twenty four hours after surgery.  Do not make any important decisions for twenty four hours after surgery or while taking narcotic pain medications or sedatives.  If you develop intractable nausea and vomiting or a severe headache please notify your doctor immediately.  FOLLOW-UP:  Please make an appointment with your surgeon as instructed. You do not need to follow up with anesthesia unless specifically instructed to do so.  WOUND CARE INSTRUCTIONS (if applicable):  Keep a dry clean dressing on the anesthesia/puncture wound site if there is drainage.  Once the wound has quit draining you may leave it open to air.  Generally you should leave the bandage intact for twenty four hours unless there is drainage.  If the epidural site  drains for more than 36-48 hours please call the anesthesia department.  QUESTIONS?:  Please feel free to call your physician or the hospital operator if you have any questions, and they will be happy to assist you.      Endoscopic Retrograde Cholangiopancreatogram Endoscopic retrograde cholangiopancreatogram (ERCP) is a procedure that may be used to diagnose or treat problems with the pancreas, bile ducts, liver, and gallbladder. For this procedure, a thin, lighted tube (endoscope) is passed through the mouth, the throat, and down into the areas being checked. The endoscope has a camera that allows the areas to be viewed. Dye is injected and then X-rays are taken to further study the areas. During ERCP, other procedures may also be done to help diagnose or treat problems that are found. For example, stones can be removed, or a tissue sample can be taken out for testing (biopsy). Tell a health care provider about:  Any allergies you have.  All medicines you are taking, including vitamins, herbs, eye drops, creams, and over-the-counter medicines.  Any problems you or family members have had with anesthetic medicines.  Any blood disorders you have.  Any surgeries you have had.  Any medical conditions you have.  Whether you are pregnant or may be pregnant. What are the risks? Generally, this is a safe procedure. However, problems may occur, including:  Pancreatitis.  Infection.  Bleeding.  Allergic reactions to medicines or dyes.  Accidental punctures in the bowel  wall, pancreas, or gallbladder.  Damage to other structures or organs.  What happens before the procedure? Staying hydrated Follow instructions from your health care provider about hydration, which may include:  Up to 2 hours before the procedure - you may continue to drink clear liquids, such as water, clear fruit juice, black coffee, and plain tea.  Eating and drinking restrictions Follow instructions from your  health care provider about eating and drinking, which may include:  8 hours before the procedure - stop eating heavy meals or foods such as meat, fried foods, or fatty foods.  6 hours before the procedure - stop eating light meals or foods, such as toast or cereal.  6 hours before the procedure - stop drinking milk or drinks that contain milk.  2 hours before the procedure - stop drinking clear liquids.  General instructions  Ask your health care provider about: ? Changing or stopping your regular medicines. This is especially important if you are taking diabetes medicines or blood thinners. ? Taking medicines such as aspirin and ibuprofen. These medicines can thin your blood. Do not take these medicines before your procedure if your health care provider instructs you not to.  Plan to have someone take you home from the hospital or clinic.  If you will be going home right after the procedure, plan to have someone with you for 24 hours. What happens during the procedure?  To lower your risk of infection, your health care team will wash or sanitize their hands.  An IV tube will be inserted into one of your veins.  You will be given one or more of the following: ? A medicine to help you relax (sedative). ? A medicine to numb the throat area (local anesthetic) and prevent gagging. Your throat may be sprayed with this medicine, or you may gargle the medicine. ? A medicine to make you fall asleep (general anesthetic). ? A medicine to lower your risk of infection (antibiotic), inflammation (anti-inflammatory), or both.  You will lie on your left side.  The endoscope will be inserted through your mouth, down the back of the throat, and into the first part of the small intestine (duodenum).  Then a small, plastic tube (cannula) will be passed through the endoscope and directed into the bile duct or pancreatic duct.  Dye will be injected through the cannula to make structures easier to see  on an X-ray.  X-rays will be taken to study the biliary and pancreatic passageways. You may be positioned on your abdomen or your back during the X-rays.  A small sample of tissue (biopsy) may be removed for examination, or other procedures may be done to fix problems that are found. The procedure may vary among health care providers and hospitals. What happens after the procedure?  Your blood pressure, heart rate, breathing rate, and blood oxygen level will be monitored until the medicines you were given have worn off.  Your throat may feel slightly sore.  You will not be allowed to eat or drink until numbness subsides.  Do not drive for 24 hours if you were given a sedative. Summary  Endoscopic retrograde cholangiopancreatogram is a procedure that may be used to diagnose or treat problems with the pancreas, bile ducts, liver, and gallbladder.  During ERCP, other procedures may also be done to help diagnose or treat problems that are found. For example, stones can be removed, or a tissue sample can be taken out for testing (biopsy).  Generally, this is a  safe procedure. However, problems may occur, including infection, bleeding, pancreatitis, accidental damage to other structures or organs, and allergic reactions to medicines or dyes.  The procedure may vary among health care providers and hospitals. This information is not intended to replace advice given to you by your health care provider. Make sure you discuss any questions you have with your health care provider. Document Released: 10/05/2000 Document Revised: 12/07/2015 Document Reviewed: 12/07/2015 Elsevier Interactive Patient Education  2017 [employer].

## 2017-06-23 ENCOUNTER — Encounter (HOSPITAL_COMMUNITY): Payer: Self-pay

## 2017-06-23 ENCOUNTER — Encounter (HOSPITAL_COMMUNITY)
Admission: RE | Admit: 2017-06-23 | Discharge: 2017-06-23 | Disposition: A | Discharge status: Home / Self Care | Payer: Medicare Other | Source: Ambulatory Visit | Attending: Internal Medicine | Admitting: Internal Medicine

## 2017-06-23 ENCOUNTER — Other Ambulatory Visit: Payer: Self-pay

## 2017-06-23 DIAGNOSIS — Z01812 Encounter for preprocedural laboratory examination: Secondary | ICD-10-CM | POA: Insufficient documentation

## 2017-06-23 DIAGNOSIS — K8050 Calculus of bile duct without cholangitis or cholecystitis without obstruction: Secondary | ICD-10-CM | POA: Diagnosis not present

## 2017-06-23 HISTORY — DX: Anxiety disorder, unspecified: F41.9

## 2017-06-23 HISTORY — DX: Unspecified osteoarthritis, unspecified site: M19.90

## 2017-06-23 LAB — CBC WITH DIFFERENTIAL/PLATELET
BASOS ABS: 0.1 10*3/uL (ref 0.0–0.1)
Basophils Relative: 1 %
EOS ABS: 0.2 10*3/uL (ref 0.0–0.7)
Eosinophils Relative: 2 %
HCT: 46.2 % — ABNORMAL HIGH (ref 36.0–46.0)
HEMOGLOBIN: 15.3 g/dL — AB (ref 12.0–15.0)
LYMPHS ABS: 3.2 10*3/uL (ref 0.7–4.0)
Lymphocytes Relative: 30 %
MCH: 30.1 pg (ref 26.0–34.0)
MCHC: 33.1 g/dL (ref 30.0–36.0)
MCV: 90.8 fL (ref 78.0–100.0)
Monocytes Absolute: 0.7 10*3/uL (ref 0.1–1.0)
Monocytes Relative: 7 %
Neutro Abs: 6.6 10*3/uL (ref 1.7–7.7)
Neutrophils Relative %: 60 %
PLATELETS: 375 10*3/uL (ref 150–400)
RBC: 5.09 MIL/uL (ref 3.87–5.11)
RDW: 13.8 % (ref 11.5–15.5)
WBC: 10.9 10*3/uL — AB (ref 4.0–10.5)

## 2017-06-23 LAB — BASIC METABOLIC PANEL
ANION GAP: 11 (ref 5–15)
BUN: 8 mg/dL (ref 6–20)
CHLORIDE: 104 mmol/L (ref 101–111)
CO2: 23 mmol/L (ref 22–32)
Calcium: 9.4 mg/dL (ref 8.9–10.3)
Creatinine, Ser: 0.81 mg/dL (ref 0.44–1.00)
Glucose, Bld: 88 mg/dL (ref 65–99)
Potassium: 3.1 mmol/L — ABNORMAL LOW (ref 3.5–5.1)
SODIUM: 138 mmol/L (ref 135–145)

## 2017-06-30 ENCOUNTER — Encounter (HOSPITAL_COMMUNITY)
Admission: RE | Disposition: A | Discharge status: Home / Self Care | Payer: Self-pay | Source: Ambulatory Visit | Attending: Internal Medicine

## 2017-06-30 ENCOUNTER — Ambulatory Visit (HOSPITAL_COMMUNITY)
Admission: RE | Admit: 2017-06-30 | Discharge: 2017-06-30 | Disposition: A | Discharge status: Home / Self Care | Payer: Medicare Other | Source: Ambulatory Visit | Attending: Internal Medicine | Admitting: Internal Medicine

## 2017-06-30 ENCOUNTER — Ambulatory Visit (HOSPITAL_COMMUNITY): Payer: Medicare Other | Admitting: Anesthesiology

## 2017-06-30 ENCOUNTER — Ambulatory Visit (HOSPITAL_COMMUNITY): Payer: Medicare Other

## 2017-06-30 ENCOUNTER — Encounter (HOSPITAL_COMMUNITY): Payer: Self-pay | Admitting: Anesthesiology

## 2017-06-30 DIAGNOSIS — M1990 Unspecified osteoarthritis, unspecified site: Secondary | ICD-10-CM | POA: Insufficient documentation

## 2017-06-30 DIAGNOSIS — Z820 Family history of epilepsy and other diseases of the nervous system: Secondary | ICD-10-CM | POA: Insufficient documentation

## 2017-06-30 DIAGNOSIS — Z79899 Other long term (current) drug therapy: Secondary | ICD-10-CM | POA: Insufficient documentation

## 2017-06-30 DIAGNOSIS — Z9049 Acquired absence of other specified parts of digestive tract: Secondary | ICD-10-CM | POA: Diagnosis not present

## 2017-06-30 DIAGNOSIS — Z9842 Cataract extraction status, left eye: Secondary | ICD-10-CM | POA: Diagnosis not present

## 2017-06-30 DIAGNOSIS — R002 Palpitations: Secondary | ICD-10-CM | POA: Insufficient documentation

## 2017-06-30 DIAGNOSIS — Z4689 Encounter for fitting and adjustment of other specified devices: Secondary | ICD-10-CM

## 2017-06-30 DIAGNOSIS — R932 Abnormal findings on diagnostic imaging of liver and biliary tract: Secondary | ICD-10-CM

## 2017-06-30 DIAGNOSIS — F419 Anxiety disorder, unspecified: Secondary | ICD-10-CM | POA: Diagnosis not present

## 2017-06-30 DIAGNOSIS — K449 Diaphragmatic hernia without obstruction or gangrene: Secondary | ICD-10-CM | POA: Insufficient documentation

## 2017-06-30 DIAGNOSIS — K2970 Gastritis, unspecified, without bleeding: Secondary | ICD-10-CM

## 2017-06-30 DIAGNOSIS — Z85828 Personal history of other malignant neoplasm of skin: Secondary | ICD-10-CM | POA: Insufficient documentation

## 2017-06-30 DIAGNOSIS — Q872 Congenital malformation syndromes predominantly involving limbs: Secondary | ICD-10-CM | POA: Diagnosis not present

## 2017-06-30 DIAGNOSIS — R0600 Dyspnea, unspecified: Secondary | ICD-10-CM | POA: Insufficient documentation

## 2017-06-30 DIAGNOSIS — Z96651 Presence of right artificial knee joint: Secondary | ICD-10-CM | POA: Diagnosis not present

## 2017-06-30 DIAGNOSIS — K8050 Calculus of bile duct without cholangitis or cholecystitis without obstruction: Secondary | ICD-10-CM | POA: Diagnosis not present

## 2017-06-30 DIAGNOSIS — I471 Supraventricular tachycardia: Secondary | ICD-10-CM | POA: Insufficient documentation

## 2017-06-30 DIAGNOSIS — H409 Unspecified glaucoma: Secondary | ICD-10-CM | POA: Diagnosis not present

## 2017-06-30 DIAGNOSIS — F17210 Nicotine dependence, cigarettes, uncomplicated: Secondary | ICD-10-CM | POA: Insufficient documentation

## 2017-06-30 DIAGNOSIS — Z886 Allergy status to analgesic agent status: Secondary | ICD-10-CM | POA: Diagnosis not present

## 2017-06-30 DIAGNOSIS — Z4589 Encounter for adjustment and management of other implanted devices: Secondary | ICD-10-CM | POA: Diagnosis present

## 2017-06-30 DIAGNOSIS — Z9841 Cataract extraction status, right eye: Secondary | ICD-10-CM | POA: Insufficient documentation

## 2017-06-30 DIAGNOSIS — K838 Other specified diseases of biliary tract: Secondary | ICD-10-CM

## 2017-06-30 DIAGNOSIS — Z811 Family history of alcohol abuse and dependence: Secondary | ICD-10-CM | POA: Diagnosis not present

## 2017-06-30 DIAGNOSIS — Z9689 Presence of other specified functional implants: Secondary | ICD-10-CM

## 2017-06-30 DIAGNOSIS — K2980 Duodenitis without bleeding: Secondary | ICD-10-CM

## 2017-06-30 DIAGNOSIS — Z885 Allergy status to narcotic agent status: Secondary | ICD-10-CM | POA: Insufficient documentation

## 2017-06-30 DIAGNOSIS — R1013 Epigastric pain: Secondary | ICD-10-CM | POA: Diagnosis not present

## 2017-06-30 DIAGNOSIS — Z4659 Encounter for fitting and adjustment of other gastrointestinal appliance and device: Secondary | ICD-10-CM

## 2017-06-30 HISTORY — PX: ESOPHAGOGASTRODUODENOSCOPY ENDOSCOPY: SHX5814

## 2017-06-30 HISTORY — PX: ERCP: SHX5425

## 2017-06-30 HISTORY — PX: REMOVAL OF STONES: SHX5545

## 2017-06-30 HISTORY — PX: GASTROINTESTINAL STENT REMOVAL: SHX6384

## 2017-06-30 HISTORY — PX: BIOPSY: SHX5522

## 2017-06-30 SURGERY — ERCP, WITH INTERVENTION IF INDICATED
Anesthesia: General

## 2017-06-30 MED ORDER — ONDANSETRON HCL 4 MG PO TABS: 4.0000 mg | ORAL_TABLET | Freq: Two times a day (BID) | ORAL | 1 refills | Status: DC | PRN

## 2017-06-30 MED ORDER — SUGAMMADEX SODIUM 200 MG/2ML IV SOLN
INTRAVENOUS | Status: AC
  Filled 2017-06-30: qty 2

## 2017-06-30 MED ORDER — IOPAMIDOL (ISOVUE-M 300) INJECTION 61%
INTRAMUSCULAR | Status: DC | PRN
  Administered 2017-06-30: 12 mL

## 2017-06-30 MED ORDER — CEFAZOLIN SODIUM-DEXTROSE 2-4 GM/100ML-% IV SOLN
2.0000 g | INTRAVENOUS | Status: DC
  Filled 2017-06-30: qty 100

## 2017-06-30 MED ORDER — ROCURONIUM BROMIDE 100 MG/10ML IV SOLN
INTRAVENOUS | Status: DC | PRN
  Administered 2017-06-30: 20 mg via INTRAVENOUS

## 2017-06-30 MED ORDER — ONDANSETRON HCL 4 MG/2ML IJ SOLN
INTRAMUSCULAR | Status: DC | PRN
  Administered 2017-06-30: 4 mg via INTRAVENOUS

## 2017-06-30 MED ORDER — FENTANYL CITRATE (PF) 100 MCG/2ML IJ SOLN
INTRAMUSCULAR | Status: DC | PRN
  Administered 2017-06-30 (×2): 50 ug via INTRAVENOUS

## 2017-06-30 MED ORDER — LACTATED RINGERS IV SOLN
INTRAVENOUS | Status: DC
  Administered 2017-06-30: 11:00:00 via INTRAVENOUS

## 2017-06-30 MED ORDER — PROPOFOL 10 MG/ML IV BOLUS
INTRAVENOUS | Status: DC | PRN
  Administered 2017-06-30: 150 mg via INTRAVENOUS

## 2017-06-30 MED ORDER — GLUCAGON HCL RDNA (DIAGNOSTIC) 1 MG IJ SOLR
INTRAMUSCULAR | Status: AC
  Filled 2017-06-30: qty 1

## 2017-06-30 MED ORDER — CHLORHEXIDINE GLUCONATE CLOTH 2 % EX PADS: 6.0000 | MEDICATED_PAD | Freq: Once | CUTANEOUS | Status: DC

## 2017-06-30 MED ORDER — EPINEPHRINE PF 1 MG/ML IJ SOLN
INTRAMUSCULAR | Status: AC
  Filled 2017-06-30: qty 1

## 2017-06-30 MED ORDER — SIMETHICONE 40 MG/0.6ML PO SUSP
ORAL | Status: DC | PRN
  Administered 2017-06-30: 1 mL

## 2017-06-30 MED ORDER — ONDANSETRON HCL 4 MG/2ML IJ SOLN
INTRAMUSCULAR | Status: AC
  Filled 2017-06-30: qty 6

## 2017-06-30 MED ORDER — FENTANYL CITRATE (PF) 100 MCG/2ML IJ SOLN
INTRAMUSCULAR | Status: AC
  Filled 2017-06-30: qty 2

## 2017-06-30 MED ORDER — RANITIDINE HCL 150 MG PO TABS: 150.0000 mg | ORAL_TABLET | Freq: Two times a day (BID) | ORAL | 2 refills | Status: DC

## 2017-06-30 MED ORDER — SUGAMMADEX SODIUM 200 MG/2ML IV SOLN
INTRAVENOUS | Status: DC | PRN
  Administered 2017-06-30: 125.2 mg via INTRAVENOUS

## 2017-06-30 MED ORDER — SUCCINYLCHOLINE CHLORIDE 20 MG/ML IJ SOLN
INTRAMUSCULAR | Status: DC | PRN
  Administered 2017-06-30: 120 mg via INTRAVENOUS

## 2017-06-30 MED ORDER — EPHEDRINE SULFATE 50 MG/ML IJ SOLN
INTRAMUSCULAR | Status: DC | PRN
  Administered 2017-06-30: 10 mg via INTRAVENOUS

## 2017-06-30 MED ORDER — SUCCINYLCHOLINE CHLORIDE 20 MG/ML IJ SOLN
INTRAMUSCULAR | Status: AC
  Filled 2017-06-30: qty 1

## 2017-06-30 MED ORDER — ROCURONIUM BROMIDE 50 MG/5ML IV SOLN
INTRAVENOUS | Status: AC
  Filled 2017-06-30: qty 1

## 2017-06-30 NOTE — Progress Notes (Signed)
Brief EGD and ERCP notes.  Small sliding hiatal hernia without erosive esophagitis. Mild antral and bulbar duodenitis; edema and erythema without erosions. Ampullary edema with erythema. Biliary stent in place.  Stent was removed. Cholangiogram obtained and small stone fragments removed with stone balloon extractor and Dormia basket. Biopsy taken from ampullary mucosa.

## 2017-06-30 NOTE — Op Note (Signed)
[facility] Patient Name: Kathryn Meyers Procedure Date: 06/30/2017 12:11 PM MRN: [id number] Date of Birth: [birth year] Attending MD: Hildred Laser , MD CSN: [id number] Age: 72 Admit Type: Outpatient Procedure:                Upper GI endoscopy Indications:              Epigastric abdominal pain Providers:                Hildred Laser, MD, Lurline Del, RN, Randa Spike,                            Technician Referring MD:             Delight Ovens, FNP Medicines:                General Anesthesia Complications:            No immediate complications. Estimated Blood Loss:     Estimated blood loss: none. Procedure:                Pre-Anesthesia Assessment:                           - Prior to the procedure, a History and Physical                            was performed, and patient medications and                            allergies were reviewed. The patient's tolerance of                            previous anesthesia was also reviewed. The risks                            and benefits of the procedure and the sedation                            options and risks were discussed with the patient.                            All questions were answered, and informed consent                            was obtained. Prior Anticoagulants: The patient has                            taken no previous anticoagulant or antiplatelet                            agents. ASA Grade Assessment: II - A patient with                            mild systemic disease. After reviewing the risks  and benefits, the patient was deemed in                            satisfactory condition to undergo the procedure.                           After obtaining informed consent, the endoscope was                            passed under direct vision. Throughout the                            procedure, the patient's blood pressure, pulse, and                            oxygen  saturations were monitored continuously. The                            EG-[id number] ([id number]) scope was introduced through the                            mouth, and advanced to the second part of duodenum.                            The upper GI endoscopy was accomplished without                            difficulty. The patient tolerated the procedure                            well. Scope In: 12:41:25 PM Scope Out: 12:44:28 PM Total Procedure Duration: 0 hours 3 minutes 3 seconds  Findings:      The examined esophagus was normal.      The Z-line was regular and was found 34 cm from the incisors.      A 2 cm hiatal hernia was present.      Patchy mild inflammation characterized by erythema and granularity was       found in the gastric antrum.      The exam was otherwise without abnormality.      Patchy mild inflammation characterized by congestion (edema) and       erythema was found in the duodenal bulb.      A previously placed plastic stent was seen in the second portion of the       duodenum. Impression:               - Normal esophagus.                           - Z-line regular, 34 cm from the incisors.                           - 2 cm hiatal hernia.                           - Gastritis.                           -  The examination was otherwise normal.                           - Duodenitis.                           - Plastic stent in the duodenum.                           - No specimens collected. Moderate Sedation:      Per Anesthesia Care Recommendation:           - Patient has a contact number available for                            emergencies. The signs and symptoms of potential                            delayed complications were discussed with the                            patient. Return to normal activities tomorrow.                            Written discharge instructions were provided to the                            patient.                           -  Clear liquid diet today.                           - Continue present medications.                           - Ranitidine 150 mg po bid.                           - See the other procedure note for documentation of                            additional recommendations. Procedure Code(s):        --- Professional ---                           [id number], Esophagogastroduodenoscopy, flexible,                            transoral; diagnostic, including collection of                            specimen(s) by brushing or washing, when performed                            (separate procedure) Diagnosis Code(s):        --- Professional ---  K44.9, Diaphragmatic hernia without obstruction or                            gangrene                           K29.70, Gastritis, unspecified, without bleeding                           K29.80, Duodenitis without bleeding                           R10.13, Epigastric pain CPT copyright 2017 American Medical Association. All rights reserved. The codes documented in this report are preliminary and upon coder review may  be revised to meet current compliance requirements. Hildred Laser, MD Hildred Laser, MD 06/30/2017 1:39:51 PM This report has been signed electronically. Number of Addenda: 0

## 2017-06-30 NOTE — Anesthesia Procedure Notes (Signed)
Procedure Name: Intubation Date/Time: 06/30/2017 12:26 PM Performed by: Vista Deck, CRNA Pre-anesthesia Checklist: Patient identified, Patient being monitored, Timeout performed, Emergency Drugs available and Suction available Patient Re-evaluated:Patient Re-evaluated prior to induction Oxygen Delivery Method: Circle System Utilized Preoxygenation: Pre-oxygenation with 100% oxygen Induction Type: IV induction Ventilation: Mask ventilation without difficulty Laryngoscope Size: Mac and 3 Grade View: Grade II Tube type: Oral Tube size: 7.0 mm Number of attempts: 1 Airway Equipment and Method: stylet Placement Confirmation: ETT inserted through vocal cords under direct vision,  positive ETCO2 and breath sounds checked- equal and bilateral Secured at: 21 cm Tube secured with: Tape Dental Injury: Teeth and Oropharynx as per pre-operative assessment

## 2017-06-30 NOTE — Anesthesia Postprocedure Evaluation (Signed)
Anesthesia Post Note  Patient: Kathryn Meyers  Procedure(s) Performed: ENDOSCOPIC RETROGRADE CHOLANGIOPANCREATOGRAPHY (ERCP) (N/A ) GASTROINTESTINAL STENT REMOVAL (N/A ) ESOPHAGOGASTRODUODENOSCOPY ENDOSCOPY BIOPSY AMPULA REMOVAL OF STONES FRAGMENTS   Patient location during evaluation: PACU Anesthesia Type: General Level of consciousness: awake and alert and patient cooperative Pain management: satisfactory to patient Vital Signs Assessment: post-procedure vital signs reviewed and stable Respiratory status: spontaneous breathing Cardiovascular status: stable Postop Assessment: no apparent nausea or vomiting Anesthetic complications: no     Last Vitals:  Vitals:   06/30/17 1415 06/30/17 1423  BP: (!) 111/54 (!) 113/52  Pulse: 81 84  Resp: 13 18  Temp:  36.7 C  SpO2:  96%    Last Pain:  Vitals:   06/30/17 1423  TempSrc: Oral  PainSc: 0-No pain                 Cordelro Gautreau

## 2017-06-30 NOTE — Anesthesia Preprocedure Evaluation (Addendum)
Anesthesia Evaluation  Patient identified by MRN, date of birth, ID band Patient awake    Reviewed: Allergy & Precautions, NPO status , Patient's Chart, lab work & pertinent test results  Airway Mallampati: II  TM Distance: <3 FB   Mouth opening: Limited Mouth Opening  Dental  (+) Teeth Intact, Poor Dentition, Implants, Dental Advisory Given   Pulmonary Current Smoker,   recent diagnosis of influenza A  Prod cough          Cardiovascular negative cardio ROS   Rhythm:Regular Rate:Tachycardia     Neuro/Psych negative neurological ROS  negative psych ROS   GI/Hepatic negative GI ROS, Neg liver ROS,   Endo/Other  negative endocrine ROS  Renal/GU negative Renal ROS     Musculoskeletal   Abdominal   Peds  Hematology   Anesthesia Other Findings H/O TMJ pain after last procedure  Reproductive/Obstetrics                             Anesthesia Physical  Anesthesia Plan  ASA: II  Anesthesia Plan: General   Post-op Pain Management:    Induction: Intravenous, Rapid sequence and Cricoid pressure planned  PONV Risk Score and Plan:   Airway Management Planned: Oral ETT  Additional Equipment:   Intra-op Plan:   Post-operative Plan: Extubation in OR  Informed Consent: I have reviewed the patients History and Physical, chart, labs and discussed the procedure including the risks, benefits and alternatives for the proposed anesthesia with the patient or authorized representative who has indicated his/her understanding and acceptance.     Plan Discussed with:   Anesthesia Plan Comments:        Anesthesia Quick Evaluation

## 2017-06-30 NOTE — Transfer of Care (Signed)
Immediate Anesthesia Transfer of Care Note  Patient: Kathryn Meyers  Procedure(s) Performed: ENDOSCOPIC RETROGRADE CHOLANGIOPANCREATOGRAPHY (ERCP) (N/A ) GASTROINTESTINAL STENT REMOVAL (N/A ) ESOPHAGOGASTRODUODENOSCOPY ENDOSCOPY BIOPSY AMPULA REMOVAL OF STONES FRAGMENTS   Patient Location: PACU  Anesthesia Type:General  Level of Consciousness: awake, oriented and patient cooperative  Airway & Oxygen Therapy: Patient Spontanous Breathing  Post-op Assessment: Report given to RN and Post -op Vital signs reviewed and stable  Post vital signs: Reviewed and stable  Last Vitals:  Vitals Value Taken Time  BP 134/68 06/30/2017  1:40 PM  Temp    Pulse 94 06/30/2017  1:44 PM  Resp 18 06/30/2017  1:44 PM  SpO2 90 % 06/30/2017  1:44 PM  Vitals shown include unvalidated device data.  Last Pain:  Vitals:   06/30/17 1327  TempSrc:   PainSc: (P) Asleep         Complications: No apparent anesthesia complications

## 2017-06-30 NOTE — Op Note (Signed)
[facility] Patient Name: Kathryn Meyers Procedure Date: 06/30/2017 12:03 PM MRN: [id number] Date of Birth: 11/24/1945 Attending MD: Hildred Laser , MD CSN: [id number] Age: 72 Admit Type: Outpatient Procedure:                ERCP Indications:              Biliary stent removal Providers:                Hildred Laser, MD, Lurline Del, RN, Randa Spike,                            Technician Referring MD:             Delight Ovens, FNP Medicines:                General Anesthesia Complications:            No immediate complications. Estimated Blood Loss:     Estimated blood loss was minimal. Procedure:                Pre-Anesthesia Assessment:                           - Prior to the procedure, a History and Physical                            was performed, and patient medications and                            allergies were reviewed. The patient's tolerance of                            previous anesthesia was also reviewed. The risks                            and benefits of the procedure and the sedation                            options and risks were discussed with the patient.                            All questions were answered, and informed consent                            was obtained. Prior Anticoagulants: The patient has                            taken no previous anticoagulant or antiplatelet                            agents. ASA Grade Assessment: II - A patient with                            mild systemic disease. After reviewing the risks  and benefits, the patient was deemed in                            satisfactory condition to undergo the procedure.                           After obtaining informed consent, the scope was                            passed under direct vision. Throughout the                            procedure, the patient's blood pressure, pulse, and                            oxygen saturations were  monitored continuously. The                            [id number] ([id number]) was introduced through the                            mouth, and used to inject contrast into and used to                            cannulate the bile duct. The ERCP was accomplished                            without difficulty. The patient tolerated the                            procedure well. Scope In: 12:47:33 PM Scope Out: 1:15:55 PM Total Procedure Duration: 0 hours 28 minutes 22 seconds  Findings:      A scout film of the abdomen was obtained. The esophagus was successfully       intubated under direct vision without detailed examination of the       pharynx, larynx, and associated structures, and upper GI tract. The       upper GI tract was grossly normal. One plastic stent originating in the       biliary tree was emerging from the major papilla. The stent was visibly       patent. The major papilla was congested. The major papilla was       edematous. The major papilla was enlarged. The major papilla was       erythematous. One stent was removed from the biliary tree using a snare.       The stent was found to be patent via the water column test. The bile       duct was deeply cannulated with the Autotome sphincterotome. Contrast       was injected. I personally interpreted the bile duct images. Ductal flow       of contrast was adequate. Image quality was excellent. Contrast extended       to the main bile duct. Contrast extended to the bifurcation. Contrast       extended to the hepatic ducts. The main bile duct and common hepatic  duct were mildly dilated, acquired. The largest diameter was 10 mm. The       lower third of the main bile duct contained filling defect(s) thought to       be a stone and sludge. The biliary tree was swept with a 10 mm balloon       and basket starting at the bifurcation. Sludge was swept from the duct.       Two stones fragments were removed. No stones remained.  {skip}ampullary       mucosa were biopsied with a cold forceps for histology. Impression:               - One visibly patent stent from the biliary tree                            was seen in the major papilla.                           - The major papilla appeared to be enlarged,                            erythematous and enlarged.                           - A filling defect consistent with a stone and                            sludge was seen on the cholangiogram.                           - The entire main bile duct and common hepatic duct                            were mildly dilated, acquired.                           - Choledocholithiasis was found. Complete removal                            was accomplished by balloon extraction.                           - One stent was removed from the biliary tree.                           - The biliary tree was swept.                           - Biopsy was performed from ampullary mucosa. Moderate Sedation:      Per Anesthesia Care Recommendation:           - Avoid aspirin and nonsteroidal anti-inflammatory                            medicines for 1 day.                           -  Clear liquid diet today.                           - Resume previous diet starting tomorrow.                           - Continue present medications.                           - OV in one month. Procedure Code(s):        --- Professional ---                           [phone], Endoscopic retrograde                            cholangiopancreatography (ERCP); with removal of                            foreign body(s) or stent(s) from biliary/pancreatic                            duct(s)                           43264, Endoscopic retrograde                            cholangiopancreatography (ERCP); with removal of                            calculi/debris from biliary/pancreatic duct(s)                           43261, Endoscopic retrograde                             cholangiopancreatography (ERCP); with biopsy,                            single or multiple Diagnosis Code(s):        --- Professional ---                           L97.67, Presence of other specified functional                            implants                           K83.8, Other specified diseases of biliary tract                           K80.50, Calculus of bile duct without cholangitis                            or cholecystitis without obstruction  Z46.59, Encounter for fitting and adjustment of                            other gastrointestinal appliance and device                           R93.2, Abnormal findings on diagnostic imaging of                            liver and biliary tract CPT copyright 2017 American [employer] Association. All rights reserved. The codes documented in this report are preliminary and upon coder review may  be revised to meet current compliance requirements. Hildred Laser, MD Hildred Laser, MD 06/30/2017 1:51:44 PM This report has been signed electronically. Number of Addenda: 0

## 2017-06-30 NOTE — H&P (Signed)
Kathryn Meyers is an 71 y.o. female.   Chief Complaint: Patient is here for ERCP with stent removal.  She will also undergo EGD prior to ERCP. HPI: Patient is 72 year old Caucasian female who was hospitalized for C. difficile colitis back in mid February.  She was found to have a dilated biliary system with large single stone.  She underwent ERCP.  She was noted to have ulcer at duodenal papilla.  Sphincterotomy was performed.  Stone was removed following mechanical lithotripsy.  Biliary stent was left in place because of edema secondary to ulceration.  Patient is here to have stent removed.  She reports midepigastric pain which started several weeks ago.  Pain occurs after meals.  She does not have nausea vomiting melena or rectal bleeding.  She denies right upper quadrant or back pain. Patient states she cannot take PPIs as well as Pepcid. She has good appetite and has not lost any weight.  She does not take any NSAIDs.  Past Medical History:  Diagnosis Date  . Abnormal EKG   . Anxiety   . Arthritis   . Cancer ([place])    skin cancer  . Dyspnea   . Dysrhythmia    SVT  . Glaucoma   . 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 Palpitations   . Shingles   . Sinus infection   . Sinus tachycardia    Mild persistent sinus tachycardia 2009 - on beta blocker    Past Surgical History:  Procedure Laterality Date  . BILIARY STENT PLACEMENT N/A 03/10/2017   Procedure: BILIARY STENT PLACEMENT;  Surgeon: Rogene Houston, MD;  [facility];  Service: Gastroenterology;  Laterality: N/A;  . CATARACT EXTRACTION Bilateral   . CESAREAN SECTION     x 4  . CHOLECYSTECTOMY  1982   cholecystitis  . COLONOSCOPY N/A 06/19/2014   Procedure: COLONOSCOPY;  Surgeon: Rogene Houston, MD;  Location: [facility];  Service: Endoscopy;  Laterality: N/A;  730 -  moved to 5/26 @ 12:00  . COLONOSCOPY N/A 09/26/2014   Procedure: COLONOSCOPY;  Surgeon: Rogene Houston, MD;  Location: [facility];  Service: Endoscopy;  Laterality: N/A;  1020 - moved to 11:15 - [name] to notify pt  . ERCP N/A 03/10/2017   Procedure: ENDOSCOPIC RETROGRADE CHOLANGIOPANCREATOGRAPHY (ERCP) With sphincterotomy and stone extraction.;  Surgeon: Rogene Houston, MD;  Location: [facility];  Service: Gastroenterology;  Laterality: N/A;  . Growth in ear removed     1994  . KNEE SURGERY Right   . rt knee replacement     2017  . SPHINCTEROTOMY N/A 03/10/2017   Procedure: SPHINCTEROTOMY;  Surgeon: Rogene Houston, MD;  [facility]: [facility];  Service: Gastroenterology;  Laterality: N/A;  . stent eye Left   . STONE EXTRACTION WITH BASKET N/A 03/10/2017   Procedure: STONE EXTRACTION WITH BASKET;  Surgeon: Rogene Houston, MD;  Location: [facility];  Service: Gastroenterology;  Laterality: N/A;  . TONSILLECTOMY     age 89    Family History  Problem Relation Age of Onset  . Dementia Mother   . Alcoholism Father    Social History:  reports that she has been smoking cigarettes.  She has a 32.00 pack-year smoking history. She has never used smokeless tobacco. She reports that she does not drink alcohol or use drugs.  Allergies:  Allergies  Allergen Reactions  . Nsaids Other (See Comments)    Rectal bleeding.  . Cortisone Other (See Comments)    Dizzy/flush/rapid heart beat.  . Demerol [Meperidine] Nausea And Vomiting    Medications Prior to Admission  Medication Sig Dispense Refill  . acetaminophen (TYLENOL) 500 MG tablet Take 500 mg by mouth every 6 (six) hours as needed for moderate pain.    . Cholecalciferol 5000 units capsule Take 5,000 Units by mouth daily.     . diazepam (VALIUM) 5 MG tablet Take 2.5-5 mg by mouth daily as needed (ONLY TAKES PRIOR TO MEDICAL PROCEDURES FOR ANXIOUSNESS TO INDUCE SLEEP.).    Marland Kitchen diclofenac sodium (VOLTAREN) 1 % GEL Apply 2 g topically  daily as needed. For arthritis pain.  1  . latanoprost (XALATAN) 0.005 % ophthalmic solution Place 1 drop into both eyes at bedtime.    . metoprolol tartrate (LOPRESSOR) 50 MG tablet Take 1 tablet (50 mg total) by mouth 3 (three) times daily. (Patient taking differently: Take 50 mg by mouth 2 (two) times daily. )    . spironolactone (ALDACTONE) 25 MG tablet Take 25 mg by mouth 2 (two) times daily.     . VENTOLIN HFA 108 (90 Base) MCG/ACT inhaler Inhale 2 puffs into the lungs every 6 (six) hours as needed for wheezing or shortness of breath.  4    No results found for this or any previous visit (from the past 48 hour(s)). No results found.  ROS  Temperature 97.9 F (36.6 C), temperature source Oral. Physical Exam  Constitutional: She appears well-developed and well-nourished.  HENT:  Mouth/Throat: Oropharynx is clear and moist.  Eyes: Conjunctivae are normal. No scleral icterus.  Neck: No thyromegaly present.  Cardiovascular: Normal rate, regular rhythm and normal heart sounds.  No murmur heard. Respiratory: Effort normal and breath sounds normal.  GI:  Abdomen is full.  She has right subcostal and lower midline scars.  On palpation abdomen is soft and nontender.  No organomegaly or masses.  Lymphadenopathy:    She has no cervical adenopathy.     Assessment/Plan History of choledocholithiasis.  Patient has biliary stent in place. Epigastric pain. Diagnostic EGD followed by ERCP with stent removal.  Hildred Laser, MD 06/30/2017, 11:51 AM

## 2017-06-30 NOTE — Discharge Instructions (Signed)
No aspirin or NSAIDs for 24 hours. Resume usual medications as before. Zantac OTC or ranitidine OTC 150 mg by mouth before breakfast and evening meal daily. Clear liquids today.  Usual diet starting tomorrow morning. No driving for 24 hours. Physician will call with biopsy results next week.   Endoscopic Retrograde Cholangiopancreatogram, Care After This sheet gives you information about how to care for yourself after your procedure. Your health care provider may also give you more specific instructions. If you have problems or questions, contact your health care provider. What can I expect after the procedure? After the procedure, it is common to have:  Soreness in your throat.  Nausea.  Bloating.  Dizziness.  Tiredness (fatigue).  Follow these instructions at home:  Take over-the-counter and prescription medicines only as told by your health care provider.  Do not drive for 24 hours if you were given a medicine to help you relax (sedative) during your procedure. Have someone stay with you for 24 hours after the procedure.  Return to your normal activities as told by your health care provider. Ask your health care provider what activities are safe for you.  Return to eating what you normally do as soon as you feel well enough or as told by your health care provider.  Keep all follow-up visits as told by your health care provider. This is important. Contact a health care provider if:  You have pain in your abdomen that does not get better with medicine.  You develop signs of infection, such as: ? Chills. ? Feeling unwell. Get help right away if:  You have difficulty swallowing.  You have worsening pain in your throat, chest, or abdomen.  You vomit bright red blood or a substance that looks like coffee grounds.  You have bloody or very black stools.  You have a fever.  You have a sudden increase in swelling (bloating) in your abdomen. Summary  After the  procedure, it is common to feel tired and to have some discomfort in your throat.  Contact your health care provider if you have signs of infection--such as chills or feeling unwell--or if you have pain that does not improve with medicine.  Get help right away if you have trouble swallowing, worsening pain, bloody or black vomit, bloody or black stools, a fever, or increased swelling in your abdomen.  Keep all follow-up visits as told by your health care provider. This is important. This information is not intended to replace advice given to you by your health care provider. Make sure you discuss any questions you have with your health care provider. Document Released: 10/31/2012 Document Revised: 11/30/2015 Document Reviewed: 11/30/2015 Elsevier Interactive Patient Education  2017 [employer].  Esophagogastroduodenoscopy, Care After Refer to this sheet in the next few weeks. These instructions provide you with information about caring for yourself after your procedure. Your health care provider may also give you more specific instructions. Your treatment has been planned according to current medical practices, but problems sometimes occur. Call your health care provider if you have any problems or questions after your procedure. What can I expect after the procedure? After the procedure, it is common to have:  A sore throat.  Nausea.  Bloating.  Dizziness.  Fatigue.  Follow these instructions at home:  Do not eat or drink anything until the numbing medicine (local anesthetic) has worn off and your gag reflex has returned. You will know that the local anesthetic has worn off when you can swallow comfortably.  Do not drive for 24 hours if you received a medicine to help you relax (sedative).  If your health care provider took a tissue sample for testing during the procedure, make sure to get your test results. This is your responsibility. Ask your health care provider or the  department performing the test when your results will be ready.  Keep all follow-up visits as told by your health care provider. This is important. Contact a health care provider if:  You cannot stop coughing.  You are not urinating.  You are urinating less than usual. Get help right away if:  You have trouble swallowing.  You cannot eat or drink.  You have throat or chest pain that gets worse.  You are dizzy or light-headed.  You faint.  You have nausea or vomiting.  You have chills.  You have a fever.  You have severe abdominal pain.  You have black, tarry, or bloody stools. This information is not intended to replace advice given to you by your health care provider. Make sure you discuss any questions you have with your health care provider. Document Released: 12/28/2011 Document Revised: 06/18/2015 Document Reviewed: 12/04/2014 Elsevier Interactive Patient Education  [employer].

## 2017-07-02 NOTE — Anesthesia Procedure Notes (Deleted)
Performed by: Devyn Sheerin S, CRNA       

## 2017-07-02 NOTE — Addendum Note (Signed)
Addendum  created 07/02/17 1114 by Vista Deck, CRNA   Child order released for a procedure order, Delete clinical note, Intraprocedure Blocks edited, Order Canceled from Note, Sign clinical note

## 2017-07-03 ENCOUNTER — Telehealth (INDEPENDENT_AMBULATORY_CARE_PROVIDER_SITE_OTHER): Payer: Self-pay | Admitting: *Deleted

## 2017-07-03 NOTE — Telephone Encounter (Signed)
Per ERCP op note, patient needs OV in one month.

## 2017-07-04 ENCOUNTER — Telehealth (INDEPENDENT_AMBULATORY_CARE_PROVIDER_SITE_OTHER): Payer: Self-pay | Admitting: Internal Medicine

## 2017-07-04 NOTE — Telephone Encounter (Signed)
Patient would like to know results from lab work if they're back

## 2017-07-04 NOTE — Telephone Encounter (Signed)
Dr.Rehman states that he is going to call the patient with the results.

## 2017-07-05 ENCOUNTER — Other Ambulatory Visit (INDEPENDENT_AMBULATORY_CARE_PROVIDER_SITE_OTHER): Payer: Self-pay | Admitting: *Deleted

## 2017-07-05 DIAGNOSIS — E876 Hypokalemia: Secondary | ICD-10-CM

## 2017-07-07 ENCOUNTER — Encounter (HOSPITAL_COMMUNITY): Payer: Self-pay | Admitting: Internal Medicine

## 2017-07-17 ENCOUNTER — Other Ambulatory Visit (INDEPENDENT_AMBULATORY_CARE_PROVIDER_SITE_OTHER): Payer: Self-pay | Admitting: *Deleted

## 2017-07-17 ENCOUNTER — Encounter (INDEPENDENT_AMBULATORY_CARE_PROVIDER_SITE_OTHER): Payer: Self-pay | Admitting: *Deleted

## 2017-07-17 DIAGNOSIS — E876 Hypokalemia: Secondary | ICD-10-CM

## 2017-08-02 ENCOUNTER — Ambulatory Visit (INDEPENDENT_AMBULATORY_CARE_PROVIDER_SITE_OTHER): Payer: Medicare Other | Admitting: Internal Medicine

## 2017-08-02 ENCOUNTER — Encounter (INDEPENDENT_AMBULATORY_CARE_PROVIDER_SITE_OTHER): Payer: Self-pay | Admitting: Internal Medicine

## 2017-08-02 VITALS — BP 120/82 | HR 60 | Temp 97.7°F | Ht <= 58 in | Wt 137.1 lb

## 2017-08-02 DIAGNOSIS — K8050 Calculus of bile duct without cholangitis or cholecystitis without obstruction: Secondary | ICD-10-CM | POA: Diagnosis not present

## 2017-08-02 NOTE — Patient Instructions (Signed)
Will call with lab results.

## 2017-08-02 NOTE — Progress Notes (Signed)
Subjective:    Patient ID: Kathryn Meyers, female    DOB: 11-Jun-1945, 72 y.o.   MRN: [id number]  HPI Here today for f/u.CBD removal. Underwent an ERCP in February of this year.   A biliary sphincterotomy performed. One stone removed.  One plastic steny placed in CBD because of edema and ulceration at ampulla.  .  In June she underwent an ERCP for biliary stent removal.  She takes she feels okay. Appetite is okay. Weight loss of 6 pounds since February.  She denies any abdominal pain. Hepatic function pending at [place].  Liver enzymes in April were normal.  Hepatic Function Latest Ref Rng & Units 05/16/2017 03/11/2017 03/10/2017  Total Protein 6.1 - 8.1 g/dL 7.2 6.7 6.9  Albumin 3.5 - 5.0 g/dL - 3.2(L) 3.2(L)  AST 10 - 35 U/L 19 39 47(H)  ALT 6 - 29 U/L 16 40 48  Alk Phosphatase 38 - 126 U/L - 64 69  Total Bilirubin 0.2 - 1.2 mg/dL 0.4 1.0 0.8  Bilirubin, Direct 0.0 - 0.2 mg/dL 0.1 - -     MRCP this 2/14./2019:2. Common bile duct dilatation and intrahepatic biliary dilatation is identified. Within the common bile duct there is a stone which measures 1.3 x 0.6 cm. 3. At the level of the ampulla there is an indeterminate filling defect which extends into the lumen of the duodenum measuring approximately 1.5 cm. Etiology indeterminate. Differential considerations include edema and inflammation secondary to impaction of stone at the ampulla versus ampullary neoplasm. She underwent and ERCP which revealed the major papilla appeared edematous, enlarged and ulcerated. The entire biliary tree was moderately dilated ,acquired. Choledocholithiasis was found. Single large stone.   Review of Systems Past Medical History:  Diagnosis Date  . Abnormal EKG   . Anxiety   . Arthritis   . Cancer ([place])    skin cancer  . Dyspnea   . Dysrhythmia    SVT  . Glaucoma   . Nail patellar syndrome    Abnormal nails of the hands usually the thumb, absent or hypertrophic patella, abnormal elbows, autosomal  dominant, must be very careful with renal function  . Palpitations   . Shingles   . Sinus infection   . Sinus tachycardia    Mild persistent sinus tachycardia 2009 - on beta blocker    Past Surgical History:  Procedure Laterality Date  . BILIARY STENT PLACEMENT N/A 03/10/2017   Procedure: BILIARY STENT PLACEMENT;  Surgeon: Rogene Houston, MD;  [facility];  Service: Gastroenterology;  Laterality: N/A;  . BIOPSY  06/30/2017   Procedure: BIOPSY AMPULA;  Surgeon: Rogene Houston, MD;  [facility];  Service: Gastroenterology;;  . CATARACT EXTRACTION Bilateral   . CESAREAN SECTION     x 4  . CHOLECYSTECTOMY  1982   cholecystitis  . COLONOSCOPY N/A 06/19/2014   Procedure: COLONOSCOPY;  Surgeon: Rogene Houston, MD;  [facility];  Service: Endoscopy;  Laterality: N/A;  730 - moved to 5/26 @ 12:00  . COLONOSCOPY N/A 09/26/2014   Procedure: COLONOSCOPY;  Surgeon: Rogene Houston, MD;  [facility];  Service: Endoscopy;  Laterality: N/A;  1020 - moved to 11:15 - [name] to notify pt  . ERCP N/A 03/10/2017   Procedure: ENDOSCOPIC RETROGRADE CHOLANGIOPANCREATOGRAPHY (ERCP) With sphincterotomy and stone extraction.;  Surgeon: Rogene Houston, MD;  [facility];  Service: Gastroenterology;  Laterality: N/A;  . ERCP N/A 06/30/2017   Procedure: ENDOSCOPIC  RETROGRADE CHOLANGIOPANCREATOGRAPHY (ERCP);  Surgeon: Rogene Houston, MD;  [facility];  Service: Endoscopy;  Laterality: N/A;  pt knows to arrive at 10:20  . ESOPHAGOGASTRODUODENOSCOPY ENDOSCOPY  06/30/2017   Procedure: ESOPHAGOGASTRODUODENOSCOPY ENDOSCOPY;  Surgeon: Rogene Houston, MD;  [facility];  Service: Endoscopy;;  . GASTROINTESTINAL STENT REMOVAL N/A 06/30/2017   Procedure: GASTROINTESTINAL STENT REMOVAL;  Surgeon: Rogene Houston, MD;  [facility];  Service: Endoscopy;  Laterality: N/A;  . Growth in ear removed     1994  . KNEE SURGERY Right   .  REMOVAL OF STONES  06/30/2017   Procedure: REMOVAL OF STONES FRAGMENTS ;  Surgeon: Rogene Houston, MD;  [facility];  Service: Gastroenterology;;  . rt knee replacement     2017  . SPHINCTEROTOMY N/A 03/10/2017   Procedure: SPHINCTEROTOMY;  Surgeon: Rogene Houston, MD;  [facility];  Service: Gastroenterology;  Laterality: N/A;  . stent eye Left   . STONE EXTRACTION WITH BASKET N/A 03/10/2017   Procedure: STONE EXTRACTION WITH BASKET;  Surgeon: Rogene Houston, MD;  [facility];  Service: Gastroenterology;  Laterality: N/A;  . TONSILLECTOMY     age 32    Allergies  Allergen Reactions  . Nsaids Other (See Comments)    Rectal bleeding.  . Cortisone Other (See Comments)    Dizzy/flush/rapid heart beat.  . Demerol [Meperidine] Nausea And Vomiting    Current Outpatient Medications on File Prior to Visit  Medication Sig Dispense Refill  . acetaminophen (TYLENOL) 500 MG tablet Take 500 mg by mouth every 6 (six) hours as needed for moderate pain.    . Cholecalciferol 5000 units capsule Take 5,000 Units by mouth daily.     . diazepam (VALIUM) 5 MG tablet Take 2.5-5 mg by mouth daily as needed (ONLY TAKES PRIOR TO MEDICAL PROCEDURES FOR ANXIOUSNESS TO INDUCE SLEEP.).    Marland Kitchen diclofenac sodium (VOLTAREN) 1 % GEL Apply 2 g topically daily as needed. For arthritis pain.  1  . latanoprost (XALATAN) 0.005 % ophthalmic solution Place 1 drop into both eyes at bedtime.    . metoprolol tartrate (LOPRESSOR) 50 MG tablet Take 1 tablet (50 mg total) by mouth 3 (three) times daily. (Patient taking differently: Take 50 mg by mouth 2 (two) times daily. )    . ondansetron (ZOFRAN) 4 MG tablet Take 1 tablet (4 mg total) by mouth 2 (two) times daily as needed for nausea or vomiting. 20 tablet 1  . ranitidine (ZANTAC) 150 MG tablet Take 1 tablet (150 mg total) by mouth 2 (two) times daily. 60 tablet 2  . spironolactone (ALDACTONE) 25 MG tablet Take 25 mg by mouth 2 (two) times  daily.     . VENTOLIN HFA 108 (90 Base) MCG/ACT inhaler Inhale 2 puffs into the lungs every 6 (six) hours as needed for wheezing or shortness of breath.  4   No current facility-administered medications on file prior to visit.        Objective:   Physical Exam Blood pressure 120/82, pulse 60, temperature 97.7 F (36.5 C), height _0  (1.422 m), weight 137 lb 1.6 oz (62.2 kg). Alert and oriented. Skin warm and dry. Oral mucosa is moist.   . Sclera anicteric, conjunctivae is pink. Thyroid not enlarged. No cervical lymphadenopathy. Lungs clear. Heart regular rate and rhythm.  Abdomen is soft. Bowel sounds are positive. No hepatomegaly. No abdominal masses felt. No tenderness.  No edema to  lower extremities.           Assessment & Plan:  CBD stone removed during ERCP with stent placement. ERCP in June for biliary stent removal.  She is doing better. Hepatic function is pending. No GI complaints. She is concerned about taking K and spironolactone. Potassium is pending. Will call her tomorrow with results.  Further recommendations to follow.

## 2017-08-03 LAB — HEPATIC FUNCTION PANEL
AG Ratio: 1.4 (calc) (ref 1.0–2.5)
ALT: 14 U/L (ref 6–29)
AST: 18 U/L (ref 10–35)
Albumin: 4.2 g/dL (ref 3.6–5.1)
Alkaline phosphatase (APISO): 109 U/L (ref 33–130)
Bilirubin, Direct: 0.1 mg/dL (ref 0.0–0.2)
GLOBULIN: 2.9 g/dL (ref 1.9–3.7)
Indirect Bilirubin: 0.4 mg/dL (calc) (ref 0.2–1.2)
Total Bilirubin: 0.5 mg/dL (ref 0.2–1.2)
Total Protein: 7.1 g/dL (ref 6.1–8.1)

## 2017-08-03 LAB — POTASSIUM: POTASSIUM: 3.9 mmol/L (ref 3.5–5.3)

## 2017-09-07 ENCOUNTER — Encounter (HOSPITAL_COMMUNITY): Payer: Self-pay | Admitting: Internal Medicine

## 2017-09-26 ENCOUNTER — Other Ambulatory Visit (INDEPENDENT_AMBULATORY_CARE_PROVIDER_SITE_OTHER): Payer: Self-pay | Admitting: Internal Medicine

## 2017-10-12 ENCOUNTER — Ambulatory Visit (INDEPENDENT_AMBULATORY_CARE_PROVIDER_SITE_OTHER): Payer: Medicare Other | Admitting: Internal Medicine

## 2017-10-12 ENCOUNTER — Encounter (INDEPENDENT_AMBULATORY_CARE_PROVIDER_SITE_OTHER): Payer: Self-pay | Admitting: *Deleted

## 2017-10-12 ENCOUNTER — Encounter (INDEPENDENT_AMBULATORY_CARE_PROVIDER_SITE_OTHER): Payer: Self-pay | Admitting: Internal Medicine

## 2017-10-12 VITALS — BP 124/78 | HR 93 | Temp 97.5°F | Ht <= 58 in | Wt 138.5 lb

## 2017-10-12 DIAGNOSIS — K8050 Calculus of bile duct without cholangitis or cholecystitis without obstruction: Secondary | ICD-10-CM

## 2017-10-12 DIAGNOSIS — R140 Abdominal distension (gaseous): Secondary | ICD-10-CM

## 2017-10-12 DIAGNOSIS — Z86010 Personal history of colonic polyps: Secondary | ICD-10-CM | POA: Insufficient documentation

## 2017-10-12 NOTE — Progress Notes (Signed)
Subjective:    Patient ID: Kathryn Meyers, female    DOB: Mar 05, 1945, 72 y.o.   MRN: [id number]     HPI Here today for f/u.CBD removal. Underwent an ERCP in February of this year.   A biliary sphincterotomy performed. One stone removed.  One plastic steny placed in CBD because of edema and ulceration at ampulla.  .  In June she underwent an ERCP for biliary stent removal.  Her last colonoscopy was in 2016 with snare polypectomy and APC therapy.  Impression:  Examination performed to cecum. Four polyps were submitted together(hepatic flexure and transverse colon). Two small polyps distal transverse colon were ablated with APC. Six polyps were submitted together(splenic flexure, sigmoid colon and rectum). Mild sigmoid colon diverticulosis. Small external hemorrhoids.Biopsy: All were tubular adenomas. Next colonoscopy 3 yrs.   APC was also used to ablated residual polyp at polypectomy site at transverse colon and splenic flexure.  States she is bloating and  Diarrhea in  the last few weeks. Some epigastric tenderness.  She has been taking Zantac. Has not been on any recent antibiotics. Hx of chronic diarrhea. Hx of C-diff in 2016 and February, 2019. She is having on average,  4 stools a day now. Stool was loose toay Her stools have been loose for about at least month.  Her stools do have a little odor. She had one stool yesterday that was normal.  Her appetite is okay. No weight loss.   Review of Systems Past Medical History:  Diagnosis Date  . Abnormal EKG   . Anxiety   . Arthritis   . Cancer ([place])    skin cancer  . Dyspnea   . Dysrhythmia    SVT  . Glaucoma   . 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 Palpitations   . Shingles   . Sinus infection   . Sinus tachycardia    Mild persistent sinus tachycardia 2009 - on beta blocker     Past Surgical History:  Procedure Laterality Date  . BILIARY STENT PLACEMENT N/A 03/10/2017   Procedure: BILIARY STENT PLACEMENT;  Surgeon: Rogene Houston, MD;  [facility];  Service: Gastroenterology;  Laterality: N/A;  . BIOPSY  06/30/2017   Procedure: BIOPSY AMPULA;  Surgeon: Rogene Houston, MD;  [facility];  Service: Gastroenterology;;  . CATARACT EXTRACTION Bilateral   . CESAREAN SECTION     x 4  . CHOLECYSTECTOMY  1982   cholecystitis  . COLONOSCOPY N/A 06/19/2014   Procedure: COLONOSCOPY;  Surgeon: Rogene Houston, MD;  [facility];  Service: Endoscopy;  Laterality: N/A;  730 - moved to 5/26 @ 12:00  . COLONOSCOPY N/A 09/26/2014   Procedure: COLONOSCOPY;  Surgeon: Rogene Houston, MD;  [facility];  Service: Endoscopy;  Laterality: N/A;  1020 - moved to 11:15 - [name] to notify pt  . ERCP N/A 03/10/2017   Procedure: ENDOSCOPIC RETROGRADE CHOLANGIOPANCREATOGRAPHY (ERCP) With sphincterotomy and stone extraction.;  Surgeon: Rogene Houston, MD;  [facility];  Service: Gastroenterology;  Laterality: N/A;  . ERCP N/A 06/30/2017   Procedure: ENDOSCOPIC RETROGRADE CHOLANGIOPANCREATOGRAPHY (ERCP);  Surgeon: Rogene Houston, MD;  [facility];  Service: Endoscopy;  Laterality: N/A;  pt knows to arrive at 10:20  . ESOPHAGOGASTRODUODENOSCOPY ENDOSCOPY  06/30/2017   Procedure: ESOPHAGOGASTRODUODENOSCOPY ENDOSCOPY;  Surgeon: Rogene Houston, MD;  [facility];  Service: Endoscopy;;  . GASTROINTESTINAL STENT REMOVAL N/A 06/30/2017   Procedure: GASTROINTESTINAL STENT REMOVAL;  Surgeon: Rogene Houston, MD;  [facility];  Service: Endoscopy;  Laterality: N/A;  . Growth in ear removed     1994  . KNEE SURGERY Right   . REMOVAL OF STONES  06/30/2017   Procedure: REMOVAL OF STONES FRAGMENTS ;  Surgeon: Rogene Houston, MD;  [facility];  Service: Gastroenterology;;  . rt knee replacement     2017   . SPHINCTEROTOMY N/A 03/10/2017   Procedure: SPHINCTEROTOMY;  Surgeon: Rogene Houston, MD;  [facility];  Service: Gastroenterology;  Laterality: N/A;  . stent eye Left   . STONE EXTRACTION WITH BASKET N/A 03/10/2017   Procedure: STONE EXTRACTION WITH BASKET;  Surgeon: Rogene Houston, MD;  [facility];  Service: Gastroenterology;  Laterality: N/A;  . TONSILLECTOMY     age 69    Allergies  Allergen Reactions  . Nsaids Other (See Comments)    Rectal bleeding.  . Cortisone Other (See Comments)    Dizzy/flush/rapid heart beat.  . Demerol [Meperidine] Nausea And Vomiting    Current Outpatient Medications on File Prior to Visit  Medication Sig Dispense Refill  . acetaminophen (TYLENOL) 500 MG tablet Take 500 mg by mouth every 6 (six) hours as needed for moderate pain.    . Cholecalciferol 5000 units capsule Take 5,000 Units by mouth daily.     . diazepam (VALIUM) 5 MG tablet Take 2.5-5 mg by mouth daily as needed (ONLY TAKES PRIOR TO MEDICAL PROCEDURES FOR ANXIOUSNESS TO INDUCE SLEEP.).    Marland Kitchen latanoprost (XALATAN) 0.005 % ophthalmic solution Place 1 drop into both eyes at bedtime.    . ondansetron (ZOFRAN) 4 MG tablet Take 1 tablet (4 mg total) by mouth 2 (two) times daily as needed for nausea or vomiting. (Patient taking differently: Take 4 mg by mouth 2 (two) times daily as needed for nausea or vomiting. As needed) 20 tablet 1  . ranitidine (ZANTAC) 150 MG tablet TAKE 1 TABLET BY MOUTH TWICE A DAY 60 tablet 2  . spironolactone (ALDACTONE) 25 MG tablet Take 25 mg by mouth 2 (two) times daily.     . metoprolol tartrate (LOPRESSOR) 50 MG tablet Take 1 tablet (50 mg total) by mouth 3 (three) times daily. (Patient taking differently: Take 50 mg by mouth 2 (two) times daily. )     No current facility-administered medications on file prior to visit.         Objective:   Physical Exam Blood pressure 124/78, pulse 93, temperature (!) 97.5 F (36.4 C), temperature  source Temporal, height 4\' 8"  (1.422 m), weight 138 lb 8 oz (62.8 kg). Alert and oriented. Skin warm and dry. Oral mucosa is moist.   . Sclera anicteric, conjunctivae is pink. Thyroid not enlarged. No cervical lymphadenopathy. Lungs clear. Heart regular rate and rhythm.  Abdomen is soft. Bowel sounds are positive. No hepatomegaly. No abdominal masses felt. No tenderness.  No edema to lower extremities.           Assessment & Plan:  Bloating, GERD. Hx of CBD stone. Am going to get an US abdomen, Hepatic,  Diarrhea with hx of C-diff x 2 : GI pathogen.

## 2017-10-12 NOTE — Patient Instructions (Addendum)
The risks of bleeding, perforation and infection were reviewed with patient. Labs and GI pathogen

## 2017-10-17 LAB — HEPATIC FUNCTION PANEL
AG RATIO: 1.6 (calc) (ref 1.0–2.5)
ALT: 21 U/L (ref 6–29)
AST: 21 U/L (ref 10–35)
Albumin: 4.7 g/dL (ref 3.6–5.1)
Alkaline phosphatase (APISO): 133 U/L — ABNORMAL HIGH (ref 33–130)
BILIRUBIN DIRECT: 0.1 mg/dL (ref 0.0–0.2)
Globulin: 2.9 g/dL (calc) (ref 1.9–3.7)
Indirect Bilirubin: 0.6 mg/dL (calc) (ref 0.2–1.2)
Total Bilirubin: 0.7 mg/dL (ref 0.2–1.2)
Total Protein: 7.6 g/dL (ref 6.1–8.1)

## 2017-10-17 LAB — GASTROINTESTINAL PATHOGEN PANEL PCR
C. difficile Tox A/B, PCR: NOT DETECTED
CRYPTOSPORIDIUM, PCR: NOT DETECTED
Campylobacter, PCR: NOT DETECTED
E COLI (STEC) STX1/STX2, PCR: NOT DETECTED
E coli (ETEC) LT/ST PCR: NOT DETECTED
E coli 0157, PCR: NOT DETECTED
GIARDIA LAMBLIA, PCR: NOT DETECTED
NOROVIRUS, PCR: NOT DETECTED
Rotavirus A, PCR: NOT DETECTED
SHIGELLA, PCR: NOT DETECTED
Salmonella, PCR: NOT DETECTED

## 2017-10-18 ENCOUNTER — Ambulatory Visit (HOSPITAL_COMMUNITY)
Admission: RE | Admit: 2017-10-18 | Discharge: 2017-10-18 | Disposition: A | Discharge status: Home / Self Care | Payer: Medicare Other | Source: Ambulatory Visit | Attending: Internal Medicine | Admitting: Internal Medicine

## 2017-10-18 ENCOUNTER — Other Ambulatory Visit: Payer: Self-pay | Admitting: Family Medicine

## 2017-10-18 DIAGNOSIS — K8050 Calculus of bile duct without cholangitis or cholecystitis without obstruction: Secondary | ICD-10-CM

## 2017-10-18 DIAGNOSIS — Z86010 Personal history of colonic polyps: Secondary | ICD-10-CM | POA: Insufficient documentation

## 2017-10-18 DIAGNOSIS — N6489 Other specified disorders of breast: Secondary | ICD-10-CM

## 2017-10-18 DIAGNOSIS — N281 Cyst of kidney, acquired: Secondary | ICD-10-CM | POA: Insufficient documentation

## 2017-10-18 DIAGNOSIS — R140 Abdominal distension (gaseous): Secondary | ICD-10-CM | POA: Insufficient documentation

## 2017-10-18 DIAGNOSIS — Z9049 Acquired absence of other specified parts of digestive tract: Secondary | ICD-10-CM | POA: Insufficient documentation

## 2017-10-24 ENCOUNTER — Ambulatory Visit
Admission: RE | Admit: 2017-10-24 | Discharge: 2017-10-24 | Disposition: A | Discharge status: Home / Self Care | Payer: Medicare Other | Source: Ambulatory Visit | Attending: Family Medicine | Admitting: Family Medicine

## 2017-10-24 DIAGNOSIS — N6489 Other specified disorders of breast: Secondary | ICD-10-CM

## 2017-10-26 ENCOUNTER — Other Ambulatory Visit (INDEPENDENT_AMBULATORY_CARE_PROVIDER_SITE_OTHER): Payer: Self-pay | Admitting: *Deleted

## 2017-10-26 DIAGNOSIS — K8050 Calculus of bile duct without cholangitis or cholecystitis without obstruction: Secondary | ICD-10-CM

## 2017-10-26 DIAGNOSIS — R140 Abdominal distension (gaseous): Secondary | ICD-10-CM

## 2017-10-26 DIAGNOSIS — Z86010 Personal history of colonic polyps: Secondary | ICD-10-CM

## 2017-10-29 ENCOUNTER — Other Ambulatory Visit (INDEPENDENT_AMBULATORY_CARE_PROVIDER_SITE_OTHER): Payer: Self-pay | Admitting: Internal Medicine

## 2017-11-13 ENCOUNTER — Encounter: Payer: Self-pay | Admitting: Cardiology

## 2017-11-13 NOTE — Progress Notes (Signed)
Cardiology Office Note  Date: 11/14/2017   ID: Kathryn Meyers, Kathryn Meyers [birth year], MRN [id number]  PCP: Loman Brooklyn, FNP  Primary Cardiologist: Rozann Lesches, MD   Chief Complaint  Patient presents with  . Cardiac follow-up    History of Present Illness: Kathryn Meyers is a 72 y.o. female last seen in August 2018.  She is here today with her husband for a follow-up visit.  From a cardiac perspective she reports no palpitations or chest pain.  She has undergone a interval GI work-up per Dr. Laural Golden, status post EGD and ERCP.  She is feeling much better, had a biliary stone and ultimately stent placement.  I personally reviewed her ECG today which shows sinus rhythm with short PR interval and diffuse nonspecific ST segment abnormalities.  She is on Lopressor 50 mg twice daily, also Aldactone.  Blood pressure is well controlled today and her heart rate is in the 70s.  Past Medical History:  Diagnosis Date  . Anxiety   . Arthritis   . Glaucoma   . History of skin cancer   . Inappropriate sinus tachycardia   . 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 Shingles     Past Surgical History:  Procedure Laterality Date  . BILIARY STENT PLACEMENT N/A 03/10/2017   Procedure: BILIARY STENT PLACEMENT;  Surgeon: Rogene Houston, MD;  [facility];  Service: Gastroenterology;  Laterality: N/A;  . BIOPSY  06/30/2017   Procedure: BIOPSY AMPULA;  Surgeon: Rogene Houston, MD;  [facility];  Service: Gastroenterology;;  . CATARACT EXTRACTION Bilateral   . CESAREAN SECTION     x 4  . CHOLECYSTECTOMY  1982   cholecystitis  . COLONOSCOPY N/A 06/19/2014   Procedure: COLONOSCOPY;  Surgeon: Rogene Houston, MD;  [facility];  Service: Endoscopy;  Laterality: N/A;  730 - moved to 5/26 @ 12:00  . COLONOSCOPY N/A  09/26/2014   Procedure: COLONOSCOPY;  Surgeon: Rogene Houston, MD;  [facility];  Service: Endoscopy;  Laterality: N/A;  1020 - moved to 11:15 - [name] to notify pt  . ERCP N/A 03/10/2017   Procedure: ENDOSCOPIC RETROGRADE CHOLANGIOPANCREATOGRAPHY (ERCP) With sphincterotomy and stone extraction.;  Surgeon: Rogene Houston, MD;  [facility];  Service: Gastroenterology;  Laterality: N/A;  . ERCP N/A 06/30/2017   Procedure: ENDOSCOPIC RETROGRADE CHOLANGIOPANCREATOGRAPHY (ERCP);  Surgeon: Rogene Houston, MD;  [facility];  Service: Endoscopy;  Laterality: N/A;  pt knows to arrive at 10:20  . ESOPHAGOGASTRODUODENOSCOPY ENDOSCOPY  06/30/2017   Procedure: ESOPHAGOGASTRODUODENOSCOPY ENDOSCOPY;  Surgeon: Rogene Houston, MD;  [facility];  Service: Endoscopy;;  . GASTROINTESTINAL STENT REMOVAL N/A 06/30/2017   Procedure: GASTROINTESTINAL STENT REMOVAL;  Surgeon: Rogene Houston, MD;  [facility];  Service: Endoscopy;  Laterality: N/A;  . Growth in ear removed     1994  . KNEE SURGERY Right   . REMOVAL OF STONES  06/30/2017   Procedure: REMOVAL OF STONES FRAGMENTS ;  Surgeon: Rogene Houston, MD;  [facility];  Service: Gastroenterology;;  . rt knee replacement     2017  . SPHINCTEROTOMY N/A 03/10/2017   Procedure: SPHINCTEROTOMY;  Surgeon: Rogene Houston, MD;  [facility];  Service: Gastroenterology;  Laterality: N/A;  . stent  eye Left   . STONE EXTRACTION WITH BASKET N/A 03/10/2017   Procedure: STONE EXTRACTION WITH BASKET;  Surgeon: Rogene Houston, MD;  [facility];  Service: Gastroenterology;  Laterality: N/A;  . TONSILLECTOMY     age 23    Current Outpatient Medications  Medication Sig Dispense Refill  . acetaminophen (TYLENOL) 500 MG tablet Take 500 mg by mouth every 6 (six) hours as needed for moderate pain.    . Cholecalciferol 5000 units capsule Take 5,000 Units by mouth daily.     . Cyanocobalamin  (VITAMIN B-12 IJ) Inject as directed every 30 (thirty) days.    . diazepam (VALIUM) 5 MG tablet Take 2.5-5 mg by mouth daily as needed (ONLY TAKES PRIOR TO MEDICAL PROCEDURES FOR ANXIOUSNESS TO INDUCE SLEEP.).    Marland Kitchen latanoprost (XALATAN) 0.005 % ophthalmic solution Place 1 drop into both eyes at bedtime.    . metoprolol tartrate (LOPRESSOR) 50 MG tablet Take 1 tablet (50 mg total) by mouth 2 (two) times daily. 180 tablet 2  . ondansetron (ZOFRAN) 4 MG tablet Take 1 tablet (4 mg total) by mouth 2 (two) times daily as needed for nausea or vomiting. (Patient taking differently: Take 4 mg by mouth 2 (two) times daily as needed for nausea or vomiting. As needed) 20 tablet 1  . ranitidine (ZANTAC) 150 MG tablet TAKE 1 TABLET BY MOUTH TWICE A DAY 60 tablet 5  . spironolactone (ALDACTONE) 25 MG tablet Take 25 mg by mouth 2 (two) times daily.      No current facility-administered medications for this visit.    Allergies:  Nsaids; Cortisone; and Demerol [meperidine]   Social History: The patient  reports that she has been smoking cigarettes. She has a 32.00 pack-year smoking history. She has never used smokeless tobacco. She reports that she does not drink alcohol or use drugs.   ROS:  Please see the history of present illness. Otherwise, complete review of systems is positive for none.  All other systems are reviewed and negative.   Physical Exam: VS:  BP 120/78   Pulse 77   Ht 4\' 8"  (1.422 m)   Wt 143 lb 9.6 oz (65.1 kg)   SpO2 98%   BMI 32.19 kg/m , BMI Body mass index is 32.19 kg/m.  Wt Readings from Last 3 Encounters:  11/14/17 143 lb 9.6 oz (65.1 kg)  10/12/17 138 lb 8 oz (62.8 kg)  08/02/17 137 lb 1.6 oz (62.2 kg)    General: Overweight woman, appears comfortable at rest. HEENT: Conjunctiva and lids normal, oropharynx clear. Neck: Supple, no elevated JVP or carotid bruits, no thyromegaly. Lungs: Clear to auscultation, nonlabored breathing at rest. Cardiac: Regular rate and rhythm, no  S3 or significant systolic murmur, no pericardial rub. Abdomen: Soft, nontender, no hepatomegaly, bowel sounds present, no guarding or rebound. Extremities: No pitting edema, distal pulses 2+.  ECG: I personally reviewed the tracing from 09/12/2016 which showed sinus rhythm with short PR interval and diffuse nonspecific ST-T wave changes.  Recent Labwork: 06/23/2017: BUN 8; Creatinine, Ser 0.81; Hemoglobin 15.3; Platelets 375; Sodium 138 08/02/2017: Potassium 3.9 10/12/2017: ALT 21; AST 21   Other Studies Reviewed Today:  Echocardiogram 5/70/2013: Study Conclusions  - Left ventricle: The cavity size was normal. Wall thickness was at the upper limits of normal. Systolic function was normal. The estimated ejection fraction was in the range of 60% to 65%. Wall motion was normal; there were no regional wall motion abnormalities. Left ventricular diastolic function parameters  were normal. - Mitral valve: Trivial regurgitation. - Tricuspid valve: Mild regurgitation directed eccentrically. Peak RV-RA gradient: 73mm Hg (S). - Pulmonary arteries: Systolic pressure was within the normal range. - Pericardium, extracardiac: There was no pericardial effusion.  Assessment and Plan:  1.  History of inappropriate sinus tachycardia.  She is actually doing well on low-dose Lopressor 50 mg twice daily.  Follow-up ECG reviewed.  2.  Essential hypertension, continues on Aldactone and metoprolol with follow-up per PCP.  Current medicines were reviewed with the patient today.   Orders Placed This Encounter  Procedures  . EKG 12-Lead    Disposition: Follow-up in 1 year.Weston Brass, Satira Sark, MD, [facility] 11/14/2017 3:23 PM    [facility] [place], [place],  [address] Phone: [phone]; Fax: [phone]

## 2017-11-14 ENCOUNTER — Ambulatory Visit (INDEPENDENT_AMBULATORY_CARE_PROVIDER_SITE_OTHER): Payer: Medicare Other | Admitting: Cardiology

## 2017-11-14 ENCOUNTER — Encounter: Payer: Self-pay | Admitting: Cardiology

## 2017-11-14 VITALS — BP 120/78 | HR 77 | Ht <= 58 in | Wt 143.6 lb

## 2017-11-14 DIAGNOSIS — R000 Tachycardia, unspecified: Secondary | ICD-10-CM

## 2017-11-14 DIAGNOSIS — I10 Essential (primary) hypertension: Secondary | ICD-10-CM

## 2017-11-14 MED ORDER — METOPROLOL TARTRATE 50 MG PO TABS: 50.0000 mg | ORAL_TABLET | Freq: Two times a day (BID) | ORAL | 2 refills | Status: DC

## 2017-11-14 NOTE — Patient Instructions (Addendum)

## 2017-11-20 ENCOUNTER — Encounter (INDEPENDENT_AMBULATORY_CARE_PROVIDER_SITE_OTHER): Payer: Self-pay | Admitting: *Deleted

## 2017-11-20 ENCOUNTER — Other Ambulatory Visit (INDEPENDENT_AMBULATORY_CARE_PROVIDER_SITE_OTHER): Payer: Self-pay | Admitting: *Deleted

## 2017-11-20 DIAGNOSIS — K8050 Calculus of bile duct without cholangitis or cholecystitis without obstruction: Secondary | ICD-10-CM

## 2017-11-20 DIAGNOSIS — Z86010 Personal history of colonic polyps: Secondary | ICD-10-CM

## 2017-11-20 DIAGNOSIS — R140 Abdominal distension (gaseous): Secondary | ICD-10-CM

## 2017-12-21 LAB — HEPATIC FUNCTION PANEL
AG Ratio: 1.5 (calc) (ref 1.0–2.5)
ALT: 18 U/L (ref 6–29)
AST: 21 U/L (ref 10–35)
Albumin: 4.4 g/dL (ref 3.6–5.1)
Alkaline phosphatase (APISO): 114 U/L (ref 33–130)
BILIRUBIN DIRECT: 0.1 mg/dL (ref 0.0–0.2)
BILIRUBIN INDIRECT: 0.3 mg/dL (ref 0.2–1.2)
Globulin: 2.9 g/dL (calc) (ref 1.9–3.7)
Total Bilirubin: 0.4 mg/dL (ref 0.2–1.2)
Total Protein: 7.3 g/dL (ref 6.1–8.1)

## 2017-12-27 ENCOUNTER — Encounter (HOSPITAL_COMMUNITY)
Admission: RE | Disposition: A | Discharge status: Home / Self Care | Payer: Self-pay | Source: Ambulatory Visit | Attending: Internal Medicine

## 2017-12-27 ENCOUNTER — Other Ambulatory Visit: Payer: Self-pay

## 2017-12-27 ENCOUNTER — Ambulatory Visit (HOSPITAL_COMMUNITY)
Admission: RE | Admit: 2017-12-27 | Discharge: 2017-12-27 | Disposition: A | Discharge status: Home / Self Care | Payer: Medicare Other | Source: Ambulatory Visit | Attending: Internal Medicine | Admitting: Internal Medicine

## 2017-12-27 ENCOUNTER — Encounter (HOSPITAL_COMMUNITY): Payer: Self-pay | Admitting: *Deleted

## 2017-12-27 DIAGNOSIS — K648 Other hemorrhoids: Secondary | ICD-10-CM | POA: Diagnosis not present

## 2017-12-27 DIAGNOSIS — Z885 Allergy status to narcotic agent status: Secondary | ICD-10-CM | POA: Insufficient documentation

## 2017-12-27 DIAGNOSIS — H409 Unspecified glaucoma: Secondary | ICD-10-CM | POA: Diagnosis not present

## 2017-12-27 DIAGNOSIS — Z860100 Personal history of colon polyps, unspecified: Secondary | ICD-10-CM | POA: Insufficient documentation

## 2017-12-27 DIAGNOSIS — Z886 Allergy status to analgesic agent status: Secondary | ICD-10-CM | POA: Insufficient documentation

## 2017-12-27 DIAGNOSIS — D120 Benign neoplasm of cecum: Secondary | ICD-10-CM | POA: Insufficient documentation

## 2017-12-27 DIAGNOSIS — F17210 Nicotine dependence, cigarettes, uncomplicated: Secondary | ICD-10-CM | POA: Diagnosis not present

## 2017-12-27 DIAGNOSIS — D123 Benign neoplasm of transverse colon: Secondary | ICD-10-CM | POA: Diagnosis not present

## 2017-12-27 DIAGNOSIS — Z79899 Other long term (current) drug therapy: Secondary | ICD-10-CM | POA: Diagnosis not present

## 2017-12-27 DIAGNOSIS — F419 Anxiety disorder, unspecified: Secondary | ICD-10-CM | POA: Insufficient documentation

## 2017-12-27 DIAGNOSIS — Z1211 Encounter for screening for malignant neoplasm of colon: Secondary | ICD-10-CM | POA: Insufficient documentation

## 2017-12-27 DIAGNOSIS — K644 Residual hemorrhoidal skin tags: Secondary | ICD-10-CM | POA: Diagnosis not present

## 2017-12-27 DIAGNOSIS — K5730 Diverticulosis of large intestine without perforation or abscess without bleeding: Secondary | ICD-10-CM | POA: Insufficient documentation

## 2017-12-27 DIAGNOSIS — Z09 Encounter for follow-up examination after completed treatment for conditions other than malignant neoplasm: Secondary | ICD-10-CM

## 2017-12-27 HISTORY — PX: COLONOSCOPY: SHX5424

## 2017-12-27 HISTORY — PX: POLYPECTOMY: SHX5525

## 2017-12-27 SURGERY — COLONOSCOPY
Anesthesia: Moderate Sedation

## 2017-12-27 MED ORDER — STERILE WATER FOR IRRIGATION IR SOLN
Status: DC | PRN
  Administered 2017-12-27: 100 mL

## 2017-12-27 MED ORDER — MEPERIDINE HCL 50 MG/ML IJ SOLN
INTRAMUSCULAR | Status: AC
  Filled 2017-12-27: qty 1

## 2017-12-27 MED ORDER — FENTANYL CITRATE (PF) 100 MCG/2ML IJ SOLN
INTRAMUSCULAR | Status: AC
  Filled 2017-12-27: qty 2

## 2017-12-27 MED ORDER — MIDAZOLAM HCL 5 MG/5ML IJ SOLN
INTRAMUSCULAR | Status: AC
  Filled 2017-12-27: qty 10

## 2017-12-27 MED ORDER — SODIUM CHLORIDE 0.9 % IV SOLN
INTRAVENOUS | Status: DC
  Administered 2017-12-27: 1000 mL via INTRAVENOUS

## 2017-12-27 MED ORDER — FENTANYL CITRATE (PF) 100 MCG/2ML IJ SOLN
INTRAMUSCULAR | Status: DC | PRN
  Administered 2017-12-27 (×2): 25 ug via INTRAVENOUS

## 2017-12-27 MED ORDER — FAMOTIDINE 20 MG PO TABS: 20.0000 mg | ORAL_TABLET | Freq: Two times a day (BID) | ORAL | 5 refills | Status: DC | PRN

## 2017-12-27 MED ORDER — MIDAZOLAM HCL 5 MG/5ML IJ SOLN
INTRAMUSCULAR | Status: DC | PRN
  Administered 2017-12-27: 2 mg via INTRAVENOUS
  Administered 2017-12-27: 1 mg via INTRAVENOUS
  Administered 2017-12-27: 2 mg via INTRAVENOUS
  Administered 2017-12-27: 1 mg via INTRAVENOUS
  Administered 2017-12-27: 2 mg via INTRAVENOUS

## 2017-12-27 MED ORDER — MEPERIDINE HCL 50 MG/ML IJ SOLN: INTRAMUSCULAR | Status: DC | PRN

## 2017-12-27 NOTE — Discharge Instructions (Addendum)
No aspirin or NSAIDs for 24 hours. Resume usual medications as before discontinue ranitidine. Pepcid or famotidine 20 mg twice daily as needed for heartburn. High-fiber diet. No driving for 24 hours. Physician will call with biopsy results.  Colon Polyps Polyps are tissue growths inside the body. Polyps can grow in many places, including the large intestine (colon). A polyp may be a round bump or a mushroom-shaped growth. You could have one polyp or several. Most colon polyps are noncancerous (benign). However, some colon polyps can become cancerous over time. What are the causes? The exact cause of colon polyps is not known. What increases the risk? This condition is more likely to develop in people who:  Have a family history of colon cancer or colon polyps.  Are older than 75 or older than 45 if they are African American.  Have inflammatory bowel disease, such as ulcerative colitis or Crohn disease.  Are overweight.  Smoke cigarettes.  Do not get enough exercise.  Drink too much alcohol.  Eat a diet that is: ? High in fat and red meat. ? Low in fiber.  Had childhood cancer that was treated with abdominal radiation.  What are the signs or symptoms? Most polyps do not cause symptoms. If you have symptoms, they may include:  Blood coming from your rectum when having a bowel movement.  Blood in your stool.The stool may look dark red or black.  A change in bowel habits, such as constipation or diarrhea.  How is this diagnosed? This condition is diagnosed with a colonoscopy. This is a procedure that uses a lighted, flexible scope to look at the inside of your colon. How is this treated? Treatment for this condition involves removing any polyps that are found. Those polyps will then be tested for cancer. If cancer is found, your health care provider will talk to you about options for colon cancer treatment. Follow these instructions at home: Diet  Eat plenty of fiber,  such as fruits, vegetables, and whole grains.  Eat foods that are high in calcium and vitamin D, such as milk, cheese, yogurt, eggs, liver, fish, and broccoli.  Limit foods high in fat, red meats, and processed meats, such as hot dogs, sausage, bacon, and lunch meats.  Maintain a healthy weight, or lose weight if recommended by your health care provider. General instructions  Do not smoke cigarettes.  Do not drink alcohol excessively.  Keep all follow-up visits as told by your health care provider. This is important. This includes keeping regularly scheduled colonoscopies. Talk to your health care provider about when you need a colonoscopy.  Exercise every day or as told by your health care provider. Contact a health care provider if:  You have new or worsening bleeding during a bowel movement.  You have new or increased blood in your stool.  You have a change in bowel habits.  You unexpectedly lose weight. This information is not intended to replace advice given to you by your health care provider. Make sure you discuss any questions you have with your health care provider. Document Released: 10/07/2003 Document Revised: 06/18/2015 Document Reviewed: 12/01/2014 Elsevier Interactive Patient Education  2018 [employer].  Hemorrhoids Hemorrhoids are swollen veins in and around the rectum or anus. There are two types of hemorrhoids:  Internal hemorrhoids. These occur in the veins that are just inside the rectum. They may poke through to the outside and become irritated and painful.  External hemorrhoids. These occur in the veins that are outside  of the anus and can be felt as a painful swelling or hard lump near the anus.  Most hemorrhoids do not cause serious problems, and they can be managed with home treatments such as diet and lifestyle changes. If home treatments do not help your symptoms, procedures can be done to shrink or remove the hemorrhoids. What are the causes? This  condition is caused by increased pressure in the anal area. This pressure may result from various things, including:  Constipation.  Straining to have a bowel movement.  Diarrhea.  Pregnancy.  Obesity.  Sitting for long periods of time.  Heavy lifting or other activity that causes you to strain.  Anal sex.  What are the signs or symptoms? Symptoms of this condition include:  Pain.  Anal itching or irritation.  Rectal bleeding.  Leakage of stool (feces).  Anal swelling.  One or more lumps around the anus.  How is this diagnosed? This condition can often be diagnosed through a visual exam. Other exams or tests may also be done, such as:  Examination of the rectal area with a gloved hand (digital rectal exam).  Examination of the anal canal using a small tube (anoscope).  A blood test, if you have lost a significant amount of blood.  A test to look inside the colon (sigmoidoscopy or colonoscopy).  How is this treated? This condition can usually be treated at home. However, various procedures may be done if dietary changes, lifestyle changes, and other home treatments do not help your symptoms. These procedures can help make the hemorrhoids smaller or remove them completely. Some of these procedures involve surgery, and others do not. Common procedures include:  Rubber band ligation. Rubber bands are placed at the base of the hemorrhoids to cut off the blood supply to them.  Sclerotherapy. Medicine is injected into the hemorrhoids to shrink them.  Infrared coagulation. A type of light energy is used to get rid of the hemorrhoids.  Hemorrhoidectomy surgery. The hemorrhoids are surgically removed, and the veins that supply them are tied off.  Stapled hemorrhoidopexy surgery. A circular stapling device is used to remove the hemorrhoids and use staples to cut off the blood supply to them.  Follow these instructions at home: Eating and drinking  Eat foods that have  a lot of fiber in them, such as whole grains, beans, nuts, fruits, and vegetables. Ask your health care provider about taking products that have added fiber (fiber supplements).  Drink enough fluid to keep your urine clear or pale yellow. Managing pain and swelling  Take warm sitz baths for 20 minutes, 3-4 times a day to ease pain and discomfort.  If directed, apply ice to the affected area. Using ice packs between sitz baths may be helpful. ? Put ice in a plastic bag. ? Place a towel between your skin and the bag. ? Leave the ice on for 20 minutes, 2-3 times a day. General instructions  Take over-the-counter and prescription medicines only as told by your health care provider.  Use medicated creams or suppositories as told.  Exercise regularly.  Go to the bathroom when you have the urge to have a bowel movement. Do not wait.  Avoid straining to have bowel movements.  Keep the anal area dry and clean. Use wet toilet paper or moist towelettes after a bowel movement.  Do not sit on the toilet for long periods of time. This increases blood pooling and pain. Contact a health care provider if:  You have increasing  pain and swelling that are not controlled by treatment or medicine.  You have uncontrolled bleeding.  You have difficulty having a bowel movement, or you are unable to have a bowel movement.  You have pain or inflammation outside the area of the hemorrhoids. This information is not intended to replace advice given to you by your health care provider. Make sure you discuss any questions you have with your health care provider. Document Released: 01/08/2000 Document Revised: 06/10/2015 Document Reviewed: 09/24/2014 Elsevier Interactive Patient Education  2018 [employer]. Colonoscopy, Adult, Care After This sheet gives you information about how to care for yourself after your procedure. Your health care provider may also give you more specific instructions. If you have  problems or questions, contact your health care provider. What can I expect after the procedure? After the procedure, it is common to have:  A small amount of blood in your stool for 24 hours after the procedure.  Some gas.  Mild abdominal cramping or bloating.  Follow these instructions at home: General instructions   For the first 24 hours after the procedure: ? Do not drive or use machinery. ? Do not sign important documents. ? Do not drink alcohol. ? Do your regular daily activities at a slower pace than normal. ? Eat soft, easy-to-digest foods. ? Rest often.  Take over-the-counter or prescription medicines only as told by your health care provider.  It is up to you to get the results of your procedure. Ask your health care provider, or the department performing the procedure, when your results will be ready. Relieving cramping and bloating  Try walking around when you have cramps or feel bloated.  Apply heat to your abdomen as told by your health care provider. Use a heat source that your health care provider recommends, such as a moist heat pack or a heating pad. ? Place a towel between your skin and the heat source. ? Leave the heat on for 20-30 minutes. ? Remove the heat if your skin turns bright red. This is especially important if you are unable to feel pain, heat, or cold. You may have a greater risk of getting burned. Eating and drinking  Drink enough fluid to keep your urine clear or pale yellow.  Resume your normal diet as instructed by your health care provider. Avoid heavy or fried foods that are hard to digest.  Avoid drinking alcohol for as long as instructed by your health care provider. Contact a health care provider if:  You have blood in your stool 2-3 days after the procedure. Get help right away if:  You have more than a small spotting of blood in your stool.  You pass large blood clots in your stool.  Your abdomen is swollen.  You have  nausea or vomiting.  You have a fever.  You have increasing abdominal pain that is not relieved with medicine. This information is not intended to replace advice given to you by your health care provider. Make sure you discuss any questions you have with your health care provider. Document Released: 08/25/2003 Document Revised: 10/05/2015 Document Reviewed: 03/24/2015 Elsevier Interactive Patient Education  [employer].

## 2017-12-27 NOTE — H&P (Signed)
Kathryn Meyers is an 72 y.o. female.   Chief Complaint: Patient is here for colonoscopy. HPI: Patient is 72 year old Caucasian female who had 2 colonoscopies in 2016 with removal of multiple polyps including a large cecal polyp.  All in all she had close to 30 polyps.  This exam was in September 2016.  She is returning for surveillance colonoscopy.  She denies abdominal pain change in bowel habits or rectal bleeding. Kathryn Meyers history significant for CRC and maternal grandmother was 53 at the time of diagnosis and died within few months. She does not take aspirin or anticoagulants.  Past Medical History:  Diagnosis Date  . Anxiety   . Arthritis   . Glaucoma   . History of skin cancer   . Inappropriate sinus tachycardia   . 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 Shingles     Past Surgical History:  Procedure Laterality Date  . BILIARY STENT PLACEMENT N/A 03/10/2017   Procedure: BILIARY STENT PLACEMENT;  Surgeon: Rogene Houston, MD;  [facility];  Service: Gastroenterology;  Laterality: N/A;  . BIOPSY  06/30/2017   Procedure: BIOPSY AMPULA;  Surgeon: Rogene Houston, MD;  Location: [facility];  Service: Gastroenterology;;  . CATARACT EXTRACTION Bilateral   . CESAREAN SECTION     x 4  . CHOLECYSTECTOMY  1982   cholecystitis  . COLONOSCOPY N/A 06/19/2014   Procedure: COLONOSCOPY;  Surgeon: Rogene Houston, MD;  Location: [facility];  Service: Endoscopy;  Laterality: N/A;  730 - moved to 5/26 @ 12:00  . COLONOSCOPY N/A 09/26/2014   Procedure: COLONOSCOPY;  Surgeon: Rogene Houston, MD;  [facility]: [facility];  Service: Endoscopy;  Laterality: N/A;  1020 - moved to 11:15 - [name] to notify pt  . ERCP N/A 03/10/2017   Procedure: ENDOSCOPIC RETROGRADE CHOLANGIOPANCREATOGRAPHY (ERCP) With sphincterotomy and stone extraction.;  Surgeon:  Rogene Houston, MD;  Location: [facility];  Service: Gastroenterology;  Laterality: N/A;  . ERCP N/A 06/30/2017   Procedure: ENDOSCOPIC RETROGRADE CHOLANGIOPANCREATOGRAPHY (ERCP);  Surgeon: Rogene Houston, MD;  [facility]: [facility];  Service: Endoscopy;  Laterality: N/A;  pt knows to arrive at 10:20  . ESOPHAGOGASTRODUODENOSCOPY ENDOSCOPY  06/30/2017   Procedure: ESOPHAGOGASTRODUODENOSCOPY ENDOSCOPY;  Surgeon: Rogene Houston, MD;  [facility];  Service: Endoscopy;;  . GASTROINTESTINAL STENT REMOVAL N/A 06/30/2017   Procedure: GASTROINTESTINAL STENT REMOVAL;  Surgeon: Rogene Houston, MD;  [facility];  Service: Endoscopy;  Laterality: N/A;  . Growth in ear removed     1994  . KNEE SURGERY Right   . REMOVAL OF STONES  06/30/2017   Procedure: REMOVAL OF STONES FRAGMENTS ;  Surgeon: Rogene Houston, MD;  [facility]: [facility];  Service: Gastroenterology;;  . rt knee replacement     2017  . SPHINCTEROTOMY N/A 03/10/2017   Procedure: SPHINCTEROTOMY;  Surgeon: Rogene Houston, MD;  [facility];  Service: Gastroenterology;  Laterality: N/A;  . stent eye Left   . STONE EXTRACTION WITH BASKET N/A 03/10/2017   Procedure: STONE EXTRACTION WITH BASKET;  Surgeon: Rogene Houston, MD;  [facility];  Service: Gastroenterology;  Laterality: N/A;  . TONSILLECTOMY     age 68    Family History  Problem Relation Age of Onset  . Dementia Mother   .  Alcoholism Father    Social History:  reports that she has been smoking cigarettes. She has a 32.00 pack-year smoking history. She has never used smokeless tobacco. She reports that she does not drink alcohol or use drugs.  Allergies:  Allergies  Allergen Reactions  . Nsaids Other (See Comments)    Rectal bleeding.  . Cortisone Other (See Comments)    Dizzy/flush/rapid heart beat.  . Demerol [Meperidine] Nausea And Vomiting    Medications Prior to Admission  Medication Sig Dispense Refill  .  acetaminophen (TYLENOL) 500 MG tablet Take 1,000 mg by mouth daily as needed for moderate pain.     . Cholecalciferol 5000 units capsule Take 5,000 Units by mouth 2 (two) times a week.     . Cyanocobalamin (VITAMIN B-12 IJ) Inject 1 Dose as directed every 30 (thirty) days.     . diazepam (VALIUM) 10 MG tablet Take 2.5 mg by mouth at bedtime as needed for sleep.    Marland Kitchen latanoprost (XALATAN) 0.005 % ophthalmic solution Place 1 drop into both eyes at bedtime.    . Liniments (BLUE-EMU SUPER STRENGTH EX) Apply 1 application topically daily as needed (arthritis pain).    . metoprolol tartrate (LOPRESSOR) 50 MG tablet Take 1 tablet (50 mg total) by mouth 2 (two) times daily. 180 tablet 2  . ranitidine (ZANTAC) 150 MG tablet TAKE 1 TABLET BY MOUTH TWICE A DAY (Patient taking differently: Take 150 mg by mouth at bedtime. ) 60 tablet 5  . spironolactone (ALDACTONE) 25 MG tablet Take 25 mg by mouth 2 (two) times daily.     . ondansetron (ZOFRAN) 4 MG tablet Take 1 tablet (4 mg total) by mouth 2 (two) times daily as needed for nausea or vomiting. 20 tablet 1    No results found for this or any previous visit (from the past 48 hour(s)). No results found.  ROS  Blood pressure 133/63, pulse 79, temperature 97.6 F (36.4 C), temperature source Oral, resp. rate 19, height 4\' 8"  (1.422 m), weight 67.1 kg, SpO2 100 %. Physical Exam  Constitutional: She appears well-developed and well-nourished.  HENT:  Mouth/Throat: Oropharynx is clear and moist.  Eyes: Conjunctivae are normal.  Neck: No thyromegaly present.  Cardiovascular: Normal rate, regular rhythm and normal heart sounds.  No murmur heard. Respiratory: Effort normal and breath sounds normal.  GI:  Abdomen is full with right subcostal and lower midline scars.  It is soft and nontender with organomegaly or masses.  Musculoskeletal: She exhibits no edema.  Lymphadenopathy:    She has no cervical adenopathy.  Neurological: She is alert.  Skin: Skin is  warm and dry.     Assessment/Plan History of colonic adenomas. Surveillance colonoscopy.  Hildred Laser, MD 12/27/2017, 11:33 AM

## 2017-12-28 NOTE — Op Note (Signed)
[facility] Patient Name: Kathryn Meyers Procedure Date: 12/27/2017 11:16 AM MRN: [id number] Date of Birth: Jan 07, 1946 Attending MD: Hildred Laser , MD CSN: [id number] Age: 72 Admit Type: Outpatient Procedure:                Colonoscopy Indications:              High risk colon cancer surveillance: Personal                            history of colonic polyps Providers:                Hildred Laser, MD, Rosina Lowenstein, RN, Nelma Rothman,                            Technician Referring MD:             Loman Brooklyn, NP Medicines:                Fentanyl 50 micrograms IV, Midazolam 8 mg IV Complications:            No immediate complications. Estimated Blood Loss:     Estimated blood loss was minimal. Procedure:                Pre-Anesthesia Assessment:                           - Prior to the procedure, a History and Physical                            was performed, and patient medications and                            allergies were reviewed. The patient's tolerance of                            previous anesthesia was also reviewed. The risks                            and benefits of the procedure and the sedation                            options and risks were discussed with the patient.                            All questions were answered, and informed consent                            was obtained. Prior Anticoagulants: The patient has                            taken no previous anticoagulant or antiplatelet                            agents. ASA Grade Assessment: II - A patient with  mild systemic disease. After reviewing the risks                            and benefits, the patient was deemed in                            satisfactory condition to undergo the procedure.                           After obtaining informed consent, the colonoscope                            was passed under direct vision. Throughout the                             procedure, the patient's blood pressure, pulse, and                            oxygen saturations were monitored continuously. The                            [id number]-[id number] ([id number]) scope was introduced through                            the anus and advanced to the the cecum, identified                            by appendiceal orifice and ileocecal valve. The                            colonoscopy was performed without difficulty. The                            patient tolerated the procedure well. The quality                            of the bowel preparation was good. The ileocecal                            valve, appendiceal orifice, and rectum were                            photographed. Scope In: 11:45:56 AM Scope Out: 12:20:14 PM Scope Withdrawal Time: 0 hours 21 minutes 40 seconds  Total Procedure Duration: 0 hours 34 minutes 18 seconds  Findings:      Skin tags were found on perianal exam.      A small polyp was found in the cecum. The polyp was sessile. Biopsies       were taken with a cold forceps for histology. The pathology specimen was       placed into Bottle Number 1.      Three sessile polyps were found in the mid transverse colon. The polyps       were 4 to 7 mm in size. These polyps were removed with a cold snare.  Resection and retrieval were complete. The pathology specimen was placed       into Bottle Number 1.      Three sessile polyps were found in the splenic flexure. The polyps were       4 to 6 mm in size. These polyps were removed with a cold snare.       Resection and retrieval were complete. The pathology specimen was placed       into Bottle Number 1.      Scattered small-mouthed diverticula were found in the sigmoid colon.      External and internal hemorrhoids were found during retroflexion. The       hemorrhoids were small. Impression:               - Perianal skin tags found on perianal exam.                           - One small polyp in the  cecum. Biopsied.                           - Three 4 to 7 mm polyps in the mid transverse                            colon, removed with a cold snare. Resected and                            retrieved.                           - Three 4 to 6 mm polyps at the splenic flexure,                            removed with a cold snare. Resected and retrieved.                           - Diverticulosis in the sigmoid colon.                           - External and internal hemorrhoids. Moderate Sedation:      Moderate (conscious) sedation was administered by the endoscopy nurse       and supervised by the endoscopist. The following parameters were       monitored: oxygen saturation, heart rate, blood pressure, CO2       capnography and response to care. Total physician intraservice time was       42 minutes. Recommendation:           - Patient has a contact number available for                            emergencies. The signs and symptoms of potential                            delayed complications were discussed with the                            patient. Return to normal activities  tomorrow.                            Written discharge instructions were provided to the                            patient.                           - High fiber diet today.                           - Continue present medications.                           - No aspirin, ibuprofen, naproxen, or other                            non-steroidal anti-inflammatory drugs for 3 days.                           - Await pathology results.                           - Repeat colonoscopy in 5 years for surveillance. Procedure Code(s):        --- Professional ---                           [phone], Colonoscopy, flexible; with removal of                            tumor(s), polyp(s), or other lesion(s) by snare                            technique                           45380, 59, Colonoscopy, flexible; with biopsy,                             single or multiple                           99153, Moderate sedation; each additional 15                            minutes intraservice time                           99153, Moderate sedation; each additional 15                            minutes intraservice time                           G0500, Moderate sedation services provided by the  same physician or other qualified health care                            professional performing a gastrointestinal                            endoscopic service that sedation supports,                            requiring the presence of an independent trained                            observer to assist in the monitoring of the                            patient's level of consciousness and physiological                            status; initial 15 minutes of intra-service time;                            patient age 47 years or older (additional time may                            be reported with [phone], as appropriate) Diagnosis Code(s):        --- Professional ---                           Z86.010, Personal history of colonic polyps                           D12.0, Benign neoplasm of cecum                           D12.3, Benign neoplasm of transverse colon (hepatic                            flexure or splenic flexure)                           K64.8, Other hemorrhoids                           K64.4, Residual hemorrhoidal skin tags                           K57.30, Diverticulosis of large intestine without                            perforation or abscess without bleeding CPT copyright 2018 [employer]. All rights reserved. The codes documented in this report are preliminary and upon coder review may  be revised to meet current compliance requirements. Hildred Laser, MD Hildred Laser, MD 12/27/2017 12:27:22 PM This report has been signed electronically. Number of Addenda: 0

## 2018-01-02 ENCOUNTER — Encounter (HOSPITAL_COMMUNITY): Payer: Self-pay | Admitting: Internal Medicine

## 2018-01-31 ENCOUNTER — Other Ambulatory Visit (INDEPENDENT_AMBULATORY_CARE_PROVIDER_SITE_OTHER): Payer: Self-pay | Admitting: *Deleted

## 2018-01-31 DIAGNOSIS — K8050 Calculus of bile duct without cholangitis or cholecystitis without obstruction: Secondary | ICD-10-CM

## 2018-01-31 LAB — HEPATIC FUNCTION PANEL
AG Ratio: 1.4 (calc) (ref 1.0–2.5)
ALT: 16 U/L (ref 6–29)
AST: 16 U/L (ref 10–35)
Albumin: 4 g/dL (ref 3.6–5.1)
Alkaline phosphatase (APISO): 86 U/L (ref 33–130)
Bilirubin, Direct: 0.1 mg/dL (ref 0.0–0.2)
GLOBULIN: 2.8 g/dL (ref 1.9–3.7)
Indirect Bilirubin: 0.3 mg/dL (calc) (ref 0.2–1.2)
Total Bilirubin: 0.4 mg/dL (ref 0.2–1.2)
Total Protein: 6.8 g/dL (ref 6.1–8.1)

## 2018-07-02 ENCOUNTER — Telehealth (INDEPENDENT_AMBULATORY_CARE_PROVIDER_SITE_OTHER): Payer: Self-pay | Admitting: *Deleted

## 2018-07-02 ENCOUNTER — Other Ambulatory Visit (INDEPENDENT_AMBULATORY_CARE_PROVIDER_SITE_OTHER): Payer: Self-pay | Admitting: Internal Medicine

## 2018-07-02 NOTE — Telephone Encounter (Signed)
Patient called - she went to get refill on Famotidine and pharmacy is out and doesn't know when they will get another supple -- please advise what she needs to do

## 2018-07-09 NOTE — Telephone Encounter (Signed)
Patient's pharmacy was called. They say that they have 90 tablets and that she can get those. There is a shortage on this medication , and they have not rec'd any in about 1 month.  Patient was called and made aware. She plans to call and let them know that she will take those. She will call our office if she has other issues.

## 2018-08-31 ENCOUNTER — Other Ambulatory Visit: Payer: Self-pay | Admitting: Cardiology

## 2018-10-24 ENCOUNTER — Encounter (INDEPENDENT_AMBULATORY_CARE_PROVIDER_SITE_OTHER): Payer: Self-pay | Admitting: Nurse Practitioner

## 2018-10-24 ENCOUNTER — Other Ambulatory Visit: Payer: Self-pay

## 2018-10-24 ENCOUNTER — Ambulatory Visit (INDEPENDENT_AMBULATORY_CARE_PROVIDER_SITE_OTHER): Payer: Medicare Other | Admitting: Nurse Practitioner

## 2018-10-24 VITALS — BP 138/83 | HR 99 | Temp 98.2°F | Ht <= 58 in | Wt 146.7 lb

## 2018-10-24 DIAGNOSIS — R1013 Epigastric pain: Secondary | ICD-10-CM | POA: Diagnosis not present

## 2018-10-24 DIAGNOSIS — R197 Diarrhea, unspecified: Secondary | ICD-10-CM

## 2018-10-24 NOTE — Patient Instructions (Signed)
1.  Complete the ordered blood test and stool test  2.  I will contact you tomorrow with your blood test results and I will verify which abdominal image study to be ordered  3.  Go back on famotidine 20 mg twice daily  4.  Florastor probiotic 1 capsule by mouth twice daily for 2 to 4 weeks  5.  If you develop severe abdominal pain present to the local emergency room  6.  Follow-up in the office in 4 to 6 weeks  7.  Small snacks sized meals, avoid fatty or spicy foods

## 2018-10-24 NOTE — Progress Notes (Addendum)
Subjective:    Patient ID: Kathryn Meyers, female    DOB: 23-Oct-1945, 73 y.o.   MRN: [id number]  HPI  She complains of having epigastric pain, upper abdominal bloat and decreased appetite for the past 4 months.  No weight loss.  No fever, sweats or chills.  She describes having a sharp upper abdominal pain daily.  She feels full after eating and often feels short of breath after meals associated with abdominal distention.  She denies having any cough or hemoptysis.  She stated symptoms are similar to what she felt when she was diagnosed with stones in her common bile duct.    She presented to [facility] emergency room 03/09/2017 with epigastric pain and diarrhea.  An MRCP identified a dilated common bile duct, choledocholithiasis and intrahepatic biliary ductal dilatation.  A filling defect at the ampulla was also noted. Her LFTs were elevated, AST 74.  ALT 60.  Total bili and alk phos were normal.  She was diagnosed also with C. difficile colitis and she was treated with vancomycin.  He was also diagnosed with influenza A she did not require Tamiflu.  She underwent an ERCP with sphincterotomy and stone extraction 03/10/2017.  She was also noted to have an ulcer at the duodenal papilla. The biliary stent was left in place because of the edema secondary to this ulceration. LFTs trended downward and her abdominal pain improved and she was discharged home on 03/11/2017. She underwent a repeat ERCP by Dr. Laural Golden on 06/30/2017 have the biliary stent removed.  A cholangiogram was obtained and small stone fragments were removed with stone balloon extractor and basket.  Biopsies of the ampullary mucosa showed peptic changes, no dysplasia or malignancy.  She is status post cholecystectomy in 1982.  She also complains of having diarrhea predominant irritable bowel syndrome. She describes passing a soft but formed stool in the morning, subsequent bowel movements later in the morning are watery with bits of  stool.  If she has another bowel movement later in the day it typically is like water without color.  No rectal bleeding or melena. She has intermittent lower abdominal cramping.  No rectal bleeding or melena.  He is currently taking Cipro 500 mg p.o. twice daily prescribed for 10 days, today is day 8.  She underwent a colonoscopy 12/2017 which showed 1 tubular adenomatous polyp to the cecum, 3 tubular adenomatous polyps to the mid transverse colon and 3 tubular adenomatous polyps at the splenic flexure, diverticulosis and external hemorrhoids. She was recommended to repeat a repeat colonoscopy in 5 years.  She has noticed the palm of her hands have been red for many months.  No history of liver disease.  Her husband is present.  Colonoscopy 12/27/2017:  Perianal skin tags found on perianal exam. - One small polyp in the cecum. Biopsied. - Three 4 to 7 mm polyps in the mid transverse colon, removed with a cold snare. - Three 4 to 6 mm polyps at the splenic flexure, removed with a cold snare.  - Diverticulosis in the sigmoid colon. - External and internal hemorrhoid -Repeat colonoscopy 5 years  ERCP 06/30/2017: - One visibly patent stent from the biliary tree was seen in the major papilla. - The major papilla appeared to be enlarged, erythematous and enlarged. - A filling defect consistent with a stone and sludge was seen on the cholangiogram. - The entire main bile duct and common hepatic duct were mildly dilated, acquired. - Choledocholithiasis was found. Complete removal  was accomplished by balloon extraction. - One stent was removed from the biliary tree. - The biliary tree was swept. - Biopsy was performed from ampullary mucosa.  EGD 07/01/2018 performed prior to ERCP. - Normal esophagus. - Z-line regular, 34 cm from the incisors. - 2 cm hiatal hernia. - Gastritis. - The examination was otherwise normal. - Duodenitis. - Plastic stent in the duodenum. - No specimens collected.   Abdominal sonogram 10/18/2017: 1. Status post cholecystectomy. Common bile duct measures within normal limits at 7.4 mm. No evidence for choledocholithiasis.  The liver was normal. 2. 1.7 cm simple right renal cyst. 3. Otherwise unremarkable and normal abdominal ultrasound. No acute abnormality identified  Abdominal MRI/MRCP 03/09/2017: 1. Diminished exam detail due to motion artifact. 2. Common bile duct dilatation and intrahepatic biliary dilatation is identified. Within the common bile duct there is a stone which measures 1.3 x 0.6 cm. 3. At the level of the ampulla there is an indeterminate filling defect which extends into the lumen of the duodenum measuring approximately 1.5 cm. Etiology indeterminate. Differential considerations include edema and inflammation secondary to impaction of stone at the ampulla versus ampullary neoplasm. Correlation with ERCP 4. Small liver and kidney cysts. 5.  Aortic Atherosclerosis   Past Medical History:  Diagnosis Date  . Anxiety   . Arthritis   . Glaucoma   . History of skin cancer   . Inappropriate sinus tachycardia   . 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 Shingles    Past Surgical History:  Procedure Laterality Date  . BILIARY STENT PLACEMENT N/A 03/10/2017   Procedure: BILIARY STENT PLACEMENT;  Surgeon: Rogene Houston, MD;  [facility];  Service: Gastroenterology;  Laterality: N/A;  . BIOPSY  06/30/2017   Procedure: BIOPSY AMPULA;  Surgeon: Rogene Houston, MD;  [facility];  Service: Gastroenterology;;  . CATARACT EXTRACTION Bilateral   . CESAREAN SECTION     x 4  . CHOLECYSTECTOMY  1982   cholecystitis  . COLONOSCOPY N/A 06/19/2014   Procedure: COLONOSCOPY;  Surgeon: Rogene Houston, MD;  [facility];  Service: Endoscopy;  Laterality: N/A;  730 -  moved to 5/26 @ 12:00  . COLONOSCOPY N/A 09/26/2014   Procedure: COLONOSCOPY;  Surgeon: Rogene Houston, MD;  [facility];  Service: Endoscopy;  Laterality: N/A;  1020 - moved to 11:15 - [name] to notify pt  . COLONOSCOPY N/A 12/27/2017   Procedure: COLONOSCOPY;  Surgeon: Rogene Houston, MD;  [facility];  Service: Endoscopy;  Laterality: N/A;  12:00  . ERCP N/A 03/10/2017   Procedure: ENDOSCOPIC RETROGRADE CHOLANGIOPANCREATOGRAPHY (ERCP) With sphincterotomy and stone extraction.;  Surgeon: Rogene Houston, MD;  [facility];  Service: Gastroenterology;  Laterality: N/A;  . ERCP N/A 06/30/2017   Procedure: ENDOSCOPIC RETROGRADE CHOLANGIOPANCREATOGRAPHY (ERCP);  Surgeon: Rogene Houston, MD;  [facility];  Service: Endoscopy;  Laterality: N/A;  pt knows to arrive at 10:20  . ESOPHAGOGASTRODUODENOSCOPY ENDOSCOPY  06/30/2017   Procedure: ESOPHAGOGASTRODUODENOSCOPY ENDOSCOPY;  Surgeon: Rogene Houston, MD;  [facility];  Service: Endoscopy;;  . GASTROINTESTINAL STENT REMOVAL N/A 06/30/2017   Procedure: GASTROINTESTINAL STENT REMOVAL;  Surgeon: Rogene Houston, MD;  [facility];  Service: Endoscopy;  Laterality: N/A;  . Growth in ear removed  [place] Right   . POLYPECTOMY  12/27/2017   Procedure: POLYPECTOMY;  Surgeon: Rogene Houston, MD;  [facility];  Service: Endoscopy;;  polyps at splenic flexure x3, cecal polyp, transverse polyps x3  . REMOVAL OF STONES  06/30/2017   Procedure: REMOVAL OF STONES FRAGMENTS ;  Surgeon: Rogene Houston, MD;  [facility];  Service: Gastroenterology;;  . rt knee replacement     2017  . SPHINCTEROTOMY N/A 03/10/2017   Procedure: SPHINCTEROTOMY;  Surgeon: Rogene Houston, MD;  [facility];  Service: Gastroenterology;  Laterality: N/A;  . stent eye Left   . STONE EXTRACTION WITH BASKET N/A 03/10/2017   Procedure: STONE EXTRACTION WITH BASKET;  Surgeon:  Rogene Houston, MD;  [facility];  Service: Gastroenterology;  Laterality: N/A;  . TONSILLECTOMY     age 11   Current Outpatient Medications on File Prior to Visit  Medication Sig Dispense Refill  . acetaminophen (TYLENOL) 500 MG tablet Take 1,000 mg by mouth daily as needed for moderate pain.     . Cholecalciferol 5000 units capsule Take 5,000 Units by mouth 2 (two) times a week.     . ciprofloxacin (CIPRO) 500 MG tablet Take 500 mg by mouth 2 (two) times daily.    . Cyanocobalamin (VITAMIN B-12 IJ) Inject 1 Dose as directed every 30 (thirty) days.     . diazepam (VALIUM) 10 MG tablet Take 2.5 mg by mouth at bedtime as needed for sleep.    . famotidine (PEPCID) 20 MG tablet TAKE 1 TABLET (20 MG TOTAL) BY MOUTH 2 (TWO) TIMES DAILY AS NEEDED FOR HEARTBURN OR INDIGESTION. 180 tablet 1  . latanoprost (XALATAN) 0.005 % ophthalmic solution Place 1 drop into both eyes at bedtime.    . Liniments (BLUE-EMU SUPER STRENGTH EX) Apply 1 application topically daily as needed (arthritis pain).    . metoprolol tartrate (LOPRESSOR) 50 MG tablet TAKE 1 TABLET BY MOUTH TWICE A DAY 180 tablet 2  . ondansetron (ZOFRAN) 4 MG tablet Take 1 tablet (4 mg total) by mouth 2 (two) times daily as needed for nausea or vomiting. 20 tablet 1  . spironolactone (ALDACTONE) 25 MG tablet Take 25 mg by mouth 2 (two) times daily.      No current facility-administered medications on file prior to visit.    Allergies  Allergen Reactions  . Nsaids Other (See Comments)    Rectal bleeding.  . Cortisone Other (See Comments)    Dizzy/flush/rapid heart beat.  . Demerol [Meperidine] Nausea And Vomiting   Review of Systems see HPI, all other systems reviewed and are negative    Objective:   Physical Exam  BP 138/83   Pulse 99   Temp 98.2 F (36.8 C) (Oral)   Ht '4\' 8"'  (1.422 m)   Wt 146 lb 11.2 oz (66.5 kg)   BMI 32.89 kg/m  General: 73 year old obese female in no acute distress Eyes: Sclera nonicteric,  conjunctiva pink Mouth: Dentition intact, a trace of white specks on tongue questionable candidiasis Neck: Supple, no lymphadenopathy or thyromegaly Heart: Tachycardic, no murmurs Lungs: Breath sounds clear throughout Abdomen: Epigastric tenderness, mild tenderness central lower abdomen without rebound or guarding, positive bowel sounds all 4 quadrants, no HSM Extremities: Hypo-thenar and thenar erythema bilaterally, no lower extremity edema    Assessment & Plan:   68.  73 year old female with a past history of common bile duct stones which required ERCP and biliary stent  in 2019 presents with epigastric pain and bloat which the patient reports is similar to the symptoms she had at the time of her ERCP. -CBC, CMP, CRP and lipase levels today -After the above lab results received I will order an abdominal image study sono versus CT versus MRCP -Avoid fatty foods -Patient to present to the emergency room if she develops severe abdominal pain  2.  Bilateral hyperthenar and thenar erythema which warrants further liver evaluation -See plan in #1  3.  History of tubular adenomatous colon polyps.  Next colonoscopy due December 2024

## 2018-10-25 ENCOUNTER — Other Ambulatory Visit (INDEPENDENT_AMBULATORY_CARE_PROVIDER_SITE_OTHER): Payer: Self-pay | Admitting: Nurse Practitioner

## 2018-10-25 ENCOUNTER — Telehealth (INDEPENDENT_AMBULATORY_CARE_PROVIDER_SITE_OTHER): Payer: Self-pay | Admitting: Nurse Practitioner

## 2018-10-25 DIAGNOSIS — R1011 Right upper quadrant pain: Secondary | ICD-10-CM

## 2018-10-25 DIAGNOSIS — D72829 Elevated white blood cell count, unspecified: Secondary | ICD-10-CM

## 2018-10-25 LAB — CBC WITH DIFFERENTIAL/PLATELET
Absolute Monocytes: 824 cells/uL (ref 200–950)
Basophils Absolute: 135 cells/uL (ref 0–200)
Basophils Relative: 1 %
Eosinophils Absolute: 135 cells/uL (ref 15–500)
Eosinophils Relative: 1 %
HCT: 45.7 % — ABNORMAL HIGH (ref 35.0–45.0)
Hemoglobin: 15.1 g/dL (ref 11.7–15.5)
Lymphs Abs: 3200 cells/uL (ref 850–3900)
MCH: 29.3 pg (ref 27.0–33.0)
MCHC: 33 g/dL (ref 32.0–36.0)
MCV: 88.7 fL (ref 80.0–100.0)
MPV: 10.7 fL (ref 7.5–12.5)
Monocytes Relative: 6.1 %
Neutro Abs: 9207 cells/uL — ABNORMAL HIGH (ref 1500–7800)
Neutrophils Relative %: 68.2 %
Platelets: 431 10*3/uL — ABNORMAL HIGH (ref 140–400)
RBC: 5.15 10*6/uL — ABNORMAL HIGH (ref 3.80–5.10)
RDW: 12.9 % (ref 11.0–15.0)
Total Lymphocyte: 23.7 %
WBC: 13.5 10*3/uL — ABNORMAL HIGH (ref 3.8–10.8)

## 2018-10-25 LAB — C-REACTIVE PROTEIN: CRP: 9.1 mg/L — ABNORMAL HIGH (ref <8.0)

## 2018-10-25 LAB — COMPLETE METABOLIC PANEL WITH GFR
AG Ratio: 1.5 (calc) (ref 1.0–2.5)
ALT: 14 U/L (ref 6–29)
AST: 21 U/L (ref 10–35)
Albumin: 4.5 g/dL (ref 3.6–5.1)
Alkaline phosphatase (APISO): 73 U/L (ref 37–153)
BUN/Creatinine Ratio: 7 (calc) (ref 6–22)
BUN: 5 mg/dL — ABNORMAL LOW (ref 7–25)
CO2: 25 mmol/L (ref 20–32)
Calcium: 10 mg/dL (ref 8.6–10.4)
Chloride: 101 mmol/L (ref 98–110)
Creat: 0.7 mg/dL (ref 0.60–0.93)
GFR, Est African American: 101 mL/min/{1.73_m2} (ref >=60)
GFR, Est Non African American: 87 mL/min/{1.73_m2} (ref >=60)
Globulin: 3.1 g/dL (calc) (ref 1.9–3.7)
Glucose, Bld: 97 mg/dL (ref 65–139)
Potassium: 3.3 mmol/L — ABNORMAL LOW (ref 3.5–5.3)
Sodium: 141 mmol/L (ref 135–146)
Total Bilirubin: 0.6 mg/dL (ref 0.2–1.2)
Total Protein: 7.6 g/dL (ref 6.1–8.1)

## 2018-10-25 LAB — LIPASE: Lipase: 29 U/L (ref 7–60)

## 2018-10-25 NOTE — Telephone Encounter (Signed)
CT sch'd 11/02/18 at 9 am (845), npo 4 hrs, pick up contrast, patient aware

## 2018-10-25 NOTE — Telephone Encounter (Signed)
Kathryn Meyers, please call patient to schedule an abdominal/pelvic CT with oral and IV contrast. Orders in Epic. RUQ, elevated WBC. I spoke with patient this am, she is aware you will be calling her later today. thx.

## 2018-10-26 LAB — GASTROINTESTINAL PATHOGEN PANEL PCR
C. difficile Tox A/B, PCR: NOT DETECTED
Campylobacter, PCR: NOT DETECTED
Cryptosporidium, PCR: NOT DETECTED
E coli (ETEC) LT/ST PCR: NOT DETECTED
E coli (STEC) stx1/stx2, PCR: NOT DETECTED
E coli 0157, PCR: NOT DETECTED
Giardia lamblia, PCR: NOT DETECTED
Norovirus, PCR: NOT DETECTED
Rotavirus A, PCR: NOT DETECTED
Salmonella, PCR: NOT DETECTED
Shigella, PCR: NOT DETECTED

## 2018-10-31 ENCOUNTER — Telehealth (INDEPENDENT_AMBULATORY_CARE_PROVIDER_SITE_OTHER): Payer: Self-pay | Admitting: Nurse Practitioner

## 2018-10-31 NOTE — Telephone Encounter (Signed)
Patient left message on voice mail stating she still has bacteria in her urine - ph# [phone]

## 2018-11-01 NOTE — Telephone Encounter (Signed)
Patient called again stating she is to have a CT scan on 10/9  -  Stated she didn't know if she should drink the contrast or not - she still has a UTI

## 2018-11-02 ENCOUNTER — Ambulatory Visit (HOSPITAL_COMMUNITY)
Admission: RE | Admit: 2018-11-02 | Discharge: 2018-11-02 | Disposition: A | Discharge status: Home / Self Care | Payer: Medicare Other | Source: Ambulatory Visit | Attending: Nurse Practitioner | Admitting: Nurse Practitioner

## 2018-11-02 ENCOUNTER — Other Ambulatory Visit: Payer: Self-pay

## 2018-11-02 DIAGNOSIS — D72829 Elevated white blood cell count, unspecified: Secondary | ICD-10-CM | POA: Insufficient documentation

## 2018-11-02 DIAGNOSIS — R1011 Right upper quadrant pain: Secondary | ICD-10-CM | POA: Insufficient documentation

## 2018-11-02 MED ORDER — IOHEXOL 300 MG/ML  SOLN
100.0000 mL | Freq: Once | INTRAMUSCULAR | Status: AC | PRN
  Administered 2018-11-02: 100 mL via INTRAVENOUS

## 2018-11-02 NOTE — Telephone Encounter (Signed)
I called the pt and left a detailed msg on her voice mail, if she is well hydrated and if her UTI sx are improving she can proceed with the CT with oral and IV contrast as ordered. Her Bun and Cr were normal. If she does not feel well hydrated or if she still has active UTI sx then CT should be canceled and rescheduled.   Kathryn Meyers, I am forwarding this to you, if patient calls office today you can give pt the above instructions thx

## 2018-11-08 ENCOUNTER — Telehealth: Payer: Self-pay | Admitting: Cardiology

## 2018-11-08 NOTE — Telephone Encounter (Signed)
Virtual Visit Pre-Appointment Phone Call  "(Name), I am calling you today to discuss your upcoming appointment. We are currently trying to limit exposure to the virus that causes COVID-19 by seeing patients at home rather than in the office."  1. "What is the BEST phone number to call the day of the visit?" - include this in appointment notes  2. Do you have or have access to (through a family member/friend) a smartphone with video capability that we can use for your visit?" a. If yes - list this number in appt notes as cell (if different from BEST phone #) and list the appointment type as a VIDEO visit in appointment notes b. If no - list the appointment type as a PHONE visit in appointment notes  3. Confirm consent - "In the setting of the current Covid19 crisis, you are scheduled for a (phone or video) visit with your provider on (date) at (time).  Just as we do with many in-office visits, in order for you to participate in this visit, we must obtain consent.  If you'd like, I can send this to your mychart (if signed up) or email for you to review.  Otherwise, I can obtain your verbal consent now.  All virtual visits are billed to your insurance company just like a normal visit would be.  By agreeing to a virtual visit, we'd like you to understand that the technology does not allow for your provider to perform an examination, and thus may limit your provider's ability to fully assess your condition. If your provider identifies any concerns that need to be evaluated in person, we will make arrangements to do so.  Finally, though the technology is pretty good, we cannot assure that it will always work on either your or our end, and in the setting of a video visit, we may have to convert it to a phone-only visit.  In either situation, we cannot ensure that we have a secure connection.  Are you willing to proceed?" STAFF: Did the patient verbally acknowledge consent to telehealth visit? Document  YES/NO here: yes  4. Advise patient to be prepared - "Two hours prior to your appointment, go ahead and check your blood pressure, pulse, oxygen saturation, and your weight (if you have the equipment to check those) and write them all down. When your visit starts, your provider will ask you for this information. If you have an Apple Watch or Kardia device, please plan to have heart rate information ready on the day of your appointment. Please have a pen and paper handy nearby the day of the visit as well."  5. Give patient instructions for MyChart download to smartphone OR [email or web address] as below if video visit (depending on what platform provider is using)  6. Inform patient they will receive a phone call 15 minutes prior to their appointment time (may be from unknown caller ID) so they should be prepared to answer    TELEPHONE CALL NOTE  Kathryn Meyers has been deemed a candidate for a follow-up tele-health visit to limit community exposure during the Covid-19 pandemic. I spoke with the patient via phone to ensure availability of phone/video source, confirm preferred email & phone number, and discuss instructions and expectations.  I reminded Kathryn Meyers to be prepared with any vital sign and/or heart rhythm information that could potentially be obtained via home monitoring, at the time of her visit. I reminded Kathryn Meyers to expect a phone call prior to  her visit.  Weston Anna 11/08/2018 3:14 PM   INSTRUCTIONS FOR DOWNLOADING THE MYCHART APP TO SMARTPHONE  - The patient must first make sure to have activated MyChart and know their login information - If Apple, go to [employer] and type in MyChart in the search bar and download the app. If Android, ask patient to go to [employer] and type in [facility] in the search bar and download the app. The app is free but as with any other app downloads, their phone may require them to verify saved payment information or  Apple/Android password.  - The patient will need to then log into the app with their MyChart username and password, and select [facility] as their healthcare provider to link the account. When it is time for your visit, go to the MyChart app, find appointments, and click Begin Video Visit. Be sure to Select Allow for your device to access the Microphone and Camera for your visit. You will then be connected, and your provider will be with you shortly.  **If they have any issues connecting, or need assistance please contact MyChart service desk [phone])**  **If using a computer, in order to ensure the best quality for their visit they will need to use either of the following Internet Browsers: [employer], or Google Chrome**  IF USING DOXIMITY or DOXY.ME - The patient will receive a link just prior to their visit by text.     FULL LENGTH CONSENT FOR TELE-HEALTH VISIT   I hereby voluntarily request, consent and authorize [facility] and its employed or contracted physicians, physician assistants, nurse practitioners or other licensed health care professionals (the Practitioner), to provide me with telemedicine health care services (the Services") as deemed necessary by the treating Practitioner. I acknowledge and consent to receive the Services by the Practitioner via telemedicine. I understand that the telemedicine visit will involve communicating with the Practitioner through live audiovisual communication technology and the disclosure of certain medical information by electronic transmission. I acknowledge that I have been given the opportunity to request an in-person assessment or other available alternative prior to the telemedicine visit and am voluntarily participating in the telemedicine visit.  I understand that I have the right to withhold or withdraw my consent to the use of telemedicine in the course of my care at any time, without affecting my right to future care  or treatment, and that the Practitioner or I may terminate the telemedicine visit at any time. I understand that I have the right to inspect all information obtained and/or recorded in the course of the telemedicine visit and may receive copies of available information for a reasonable fee.  I understand that some of the potential risks of receiving the Services via telemedicine include:   Delay or interruption in medical evaluation due to technological equipment failure or disruption;  Information transmitted may not be sufficient (e.g. poor resolution of images) to allow for appropriate medical decision making by the Practitioner; and/or   In rare instances, security protocols could fail, causing a breach of personal health information.  Furthermore, I acknowledge that it is my responsibility to provide information about my medical history, conditions and care that is complete and accurate to the best of my ability. I acknowledge that Practitioner's advice, recommendations, and/or decision may be based on factors not within their control, such as incomplete or inaccurate data provided by me or distortions of diagnostic images or specimens that may result from electronic transmissions. I  understand that the practice of medicine is not an exact science and that Practitioner makes no warranties or guarantees regarding treatment outcomes. I acknowledge that I will receive a copy of this consent concurrently upon execution via email to the email address I last provided but may also request a printed copy by calling the office of [facility].    I understand that my insurance will be billed for this visit.   I have read or had this consent read to me.  I understand the contents of this consent, which adequately explains the benefits and risks of the Services being provided via telemedicine.   I have been provided ample opportunity to ask questions regarding this consent and the Services and have had  my questions answered to my satisfaction.  I give my informed consent for the services to be provided through the use of telemedicine in my medical care  By participating in this telemedicine visit I agree to the above.

## 2018-11-14 ENCOUNTER — Telehealth (INDEPENDENT_AMBULATORY_CARE_PROVIDER_SITE_OTHER): Payer: Medicare Other | Admitting: Cardiology

## 2018-11-14 ENCOUNTER — Encounter: Payer: Self-pay | Admitting: Cardiology

## 2018-11-14 ENCOUNTER — Telehealth (INDEPENDENT_AMBULATORY_CARE_PROVIDER_SITE_OTHER): Payer: Self-pay | Admitting: Nurse Practitioner

## 2018-11-14 VITALS — BP 149/81 | HR 96 | Ht <= 58 in | Wt 148.0 lb

## 2018-11-14 DIAGNOSIS — R000 Tachycardia, unspecified: Secondary | ICD-10-CM | POA: Diagnosis not present

## 2018-11-14 DIAGNOSIS — I10 Essential (primary) hypertension: Secondary | ICD-10-CM

## 2018-11-14 NOTE — Telephone Encounter (Signed)
Patient stated she felt so much better after she started taking Florastor probiotic 1 bid, she stated it was a miracle drug. No further bloat. She will follow up with her pcp regarding low potassium level and kidney cysts seen on CT.

## 2018-11-14 NOTE — Patient Instructions (Addendum)

## 2018-11-14 NOTE — Progress Notes (Signed)
Virtual Visit via Telephone Note   This visit type was conducted due to national recommendations for restrictions regarding the COVID-19 Pandemic (e.g. social distancing) in an effort to limit this patient's exposure and mitigate transmission in our community.  Due to her co-morbid illnesses, this patient is at least at moderate risk for complications without adequate follow up.  This format is felt to be most appropriate for this patient at this time.  The patient did not have access to video technology/had technical difficulties with video requiring transitioning to audio format only (telephone).  All issues noted in this document were discussed and addressed.  No physical exam could be performed with this format.  Please refer to the patient's chart for her  consent to telehealth for [facility].   Date:  11/14/2018   ID:  Kathryn Meyers, DOB Oct 22, 1945, MRN [id number]  Patient Location: Home Provider Location: Office  PCP:  Neale Burly, MD  Cardiologist:  Rozann Lesches, MD Electrophysiologist:  None   Evaluation Performed:  Follow-Up Visit  Chief Complaint:   Cardiac follow-up  History of Present Illness:    Kathryn Meyers is a 73 y.o. female last seen in October 2019.  We spoke by phone today.  She does not report any unusual change in her heart rate, except that she does notice it increase at times when she has an infection or is otherwise sick.  She had a UTI recently and noticed that her heart rate was higher than normal.  She continues on Lopressor for treatment of inappropriate sinus tachycardia.  She does not report any chest pain or syncope.  I reviewed her medications which are outlined below.  She is now following with Dr. Sherrie Sport for primary care.  I reviewed recent lab work done through the GI service.  The patient does not have symptoms concerning for COVID-19 infection (fever, chills, cough, or new shortness of breath).    Past Medical History:   Diagnosis Date  . Anxiety   . Arthritis   . Glaucoma   . History of skin cancer   . Inappropriate sinus tachycardia   . 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 Shingles    Past Surgical History:  Procedure Laterality Date  . BILIARY STENT PLACEMENT N/A 03/10/2017   Procedure: BILIARY STENT PLACEMENT;  Surgeon: Rogene Houston, MD;  [facility];  Service: Gastroenterology;  Laterality: N/A;  . BIOPSY  06/30/2017   Procedure: BIOPSY AMPULA;  Surgeon: Rogene Houston, MD;  [facility];  Service: Gastroenterology;;  . CATARACT EXTRACTION Bilateral   . CESAREAN SECTION     x 4  . CHOLECYSTECTOMY  1982   cholecystitis  . COLONOSCOPY N/A 06/19/2014   Procedure: COLONOSCOPY;  Surgeon: Rogene Houston, MD;  [facility];  Service: Endoscopy;  Laterality: N/A;  730 - moved to 5/26 @ 12:00  . COLONOSCOPY N/A 09/26/2014   Procedure: COLONOSCOPY;  Surgeon: Rogene Houston, MD;  [facility];  Service: Endoscopy;  Laterality: N/A;  1020 - moved to 11:15 - [name] to notify pt  . COLONOSCOPY N/A 12/27/2017   Procedure: COLONOSCOPY;  Surgeon: Rogene Houston, MD;  [facility];  Service: Endoscopy;  Laterality: N/A;  12:00  . ERCP N/A 03/10/2017   Procedure: ENDOSCOPIC RETROGRADE CHOLANGIOPANCREATOGRAPHY (ERCP) With sphincterotomy  and stone extraction.;  Surgeon: Rogene Houston, MD;  [facility];  Service: Gastroenterology;  Laterality: N/A;  . ERCP N/A 06/30/2017   Procedure: ENDOSCOPIC RETROGRADE CHOLANGIOPANCREATOGRAPHY (ERCP);  Surgeon: Rogene Houston, MD;  [facility];  Service: Endoscopy;  Laterality: N/A;  pt knows to arrive at 10:20  . ESOPHAGOGASTRODUODENOSCOPY ENDOSCOPY  06/30/2017   Procedure: ESOPHAGOGASTRODUODENOSCOPY ENDOSCOPY;  Surgeon: Rogene Houston, MD;  [facility];  Service: Endoscopy;;  . GASTROINTESTINAL STENT REMOVAL N/A 06/30/2017   Procedure: GASTROINTESTINAL STENT REMOVAL;  Surgeon: Rogene Houston, MD;  [facility];  Service: Endoscopy;  Laterality: N/A;  . Growth in ear removed     1994  . KNEE SURGERY Right   . POLYPECTOMY  12/27/2017   Procedure: POLYPECTOMY;  Surgeon: Rogene Houston, MD;  [facility];  Service: Endoscopy;;  polyps at splenic flexure x3, cecal polyp, transverse polyps x3  . REMOVAL OF STONES  06/30/2017   Procedure: REMOVAL OF STONES FRAGMENTS ;  Surgeon: Rogene Houston, MD;  [facility];  Service: Gastroenterology;;  . rt knee replacement     2017  . SPHINCTEROTOMY N/A 03/10/2017   Procedure: SPHINCTEROTOMY;  Surgeon: Rogene Houston, MD;  [facility];  Service: Gastroenterology;  Laterality: N/A;  . stent eye Left   . STONE EXTRACTION WITH BASKET N/A 03/10/2017   Procedure: STONE EXTRACTION WITH BASKET;  Surgeon: Rogene Houston, MD;  [facility];  Service: Gastroenterology;  Laterality: N/A;  . TONSILLECTOMY     age 47     Current Meds  Medication Sig  . acetaminophen (TYLENOL) 500 MG tablet Take 1,000 mg by mouth daily as needed for moderate pain.   Marland Kitchen albuterol (VENTOLIN HFA) 108 (90 Base) MCG/ACT inhaler Inhale 1-2 puffs into the lungs every 6 (six) hours as needed for wheezing or shortness of breath.  . Cholecalciferol 5000 units capsule Take 5,000 Units by mouth daily.   . Cyanocobalamin (VITAMIN B-12 IJ) Inject 1 Dose as directed every 30 (thirty) days.   . diazepam (VALIUM) 10 MG tablet Take 2.5 mg by mouth at bedtime as needed for sleep.  . famotidine (PEPCID) 20 MG tablet TAKE 1 TABLET (20 MG TOTAL) BY MOUTH 2 (TWO) TIMES DAILY AS NEEDED FOR HEARTBURN OR INDIGESTION. (Patient taking differently: Take 20 mg by mouth daily. )  . Liniments (BLUE-EMU SUPER STRENGTH EX) Apply 1 application topically daily as needed (arthritis pain).  .  metoprolol tartrate (LOPRESSOR) 50 MG tablet TAKE 1 TABLET BY MOUTH TWICE A DAY  . ondansetron (ZOFRAN) 4 MG tablet Take 1 tablet (4 mg total) by mouth 2 (two) times daily as needed for nausea or vomiting.  . Saccharomyces boulardii (FLORASTOR PO) Take 1 capsule by mouth as directed.  Marland Kitchen spironolactone (ALDACTONE) 25 MG tablet Take 25 mg by mouth 2 (two) times daily.      Allergies:   Nsaids, Cortisone, and Demerol [meperidine]   Social History   Tobacco Use  . Smoking status: Current Every Day Smoker    Packs/day: 1.00    Years: 32.00    Pack years: 32.00    Types: Cigarettes    Start date: 03/21/1977  . Smokeless tobacco: Never Used  Substance Use Topics  . Alcohol use: No    Alcohol/week: 0.0 standard drinks  . Drug use: No     Family Hx: The patient's family history includes Alcoholism in her father; Dementia in  her mother.  ROS:   Please see the history of present illness.    Recent UTI. All other systems reviewed and are negative.   Prior CV studies:   The following studies were reviewed today:  Echocardiogram 5/70/2013: Study Conclusions  - Left ventricle: The cavity size was normal. Wall thickness was at the upper limits of normal. Systolic function was normal. The estimated ejection fraction was in the range of 60% to 65%. Wall motion was normal; there were no regional wall motion abnormalities. Left ventricular diastolic function parameters were normal. - Mitral valve: Trivial regurgitation. - Tricuspid valve: Mild regurgitation directed eccentrically. Peak RV-RA gradient: 26mm Hg (S). - Pulmonary arteries: Systolic pressure was within the normal range. - Pericardium, extracardiac: There was no pericardial effusion.  Labs/Other Tests and Data Reviewed:    EKG:  An ECG dated 11/14/2017 was personally reviewed today and demonstrated:  Sinus rhythm with short PR interval and diffuse nonspecific ST segment changes.  Recent Labs: 10/24/2018:  ALT 14; BUN 5; Creat 0.70; Hemoglobin 15.1; Platelets 431; Potassium 3.3; Sodium 141    Wt Readings from Last 3 Encounters:  11/14/18 148 lb (67.1 kg)  10/24/18 146 lb 11.2 oz (66.5 kg)  12/27/17 148 lb (67.1 kg)     Objective:    Vital Signs:  BP (!) 149/81   Pulse 96   Ht 4\' 8"  (1.422 m)   Wt 148 lb (67.1 kg)   BMI 33.18 kg/m    Patient spoke in full sentences, not short of breath. No audible wheezing or coughing. Speech pattern normal.  ASSESSMENT & PLAN:    1.  Inappropriate sinus tachycardia.  Plan to continue Lopressor at current dose.  We will obtain an ECG for her next follow-up visit.  2.  Essential hypertension by history.  She remains on Aldactone along with metoprolol.  Recent lab work reviewed.  COVID-19 Education: The signs and symptoms of COVID-19 were discussed with the patient and how to seek care for testing (follow up with PCP or arrange E-visit).  The importance of social distancing was discussed today.  Time:   Today, I have spent 7 minutes with the patient with telehealth technology discussing the above problems.     Medication Adjustments/Labs and Tests Ordered: Current medicines are reviewed at length with the patient today.  Concerns regarding medicines are outlined above.   Tests Ordered: No orders of the defined types were placed in this encounter.   Medication Changes: No orders of the defined types were placed in this encounter.   Follow Up:  In Person 1 year in the [place] office.  Signed, Rozann Lesches, MD  11/14/2018 1:33 PM    [facility]

## 2018-11-14 NOTE — Telephone Encounter (Signed)
Patient called would like a call back to discuss test results - ph# [phone]

## 2018-12-12 ENCOUNTER — Ambulatory Visit (INDEPENDENT_AMBULATORY_CARE_PROVIDER_SITE_OTHER): Payer: Medicare Other | Admitting: Nurse Practitioner

## 2018-12-12 ENCOUNTER — Encounter (INDEPENDENT_AMBULATORY_CARE_PROVIDER_SITE_OTHER): Payer: Self-pay | Admitting: Nurse Practitioner

## 2018-12-12 ENCOUNTER — Other Ambulatory Visit: Payer: Self-pay

## 2018-12-12 VITALS — BP 141/85 | HR 82 | Temp 97.3°F | Ht <= 58 in | Wt 147.9 lb

## 2018-12-12 DIAGNOSIS — R1013 Epigastric pain: Secondary | ICD-10-CM | POA: Diagnosis not present

## 2018-12-12 NOTE — Patient Instructions (Signed)
1. Ok to continue [employer] probiotic one capsule by mouth twice daily  2. Call our office if your abdominal pain/bloat recurs

## 2018-12-12 NOTE — Progress Notes (Signed)
   Subjective:    Patient ID: Kathryn Meyers, female    DOB: November 04, 1945, 73 y.o.   MRN: [id number]  HPI Kathryn Meyers is a 73 year old female with a past medical history of anxiety, arthritis, glaucoma, cholelithiasis status post ERCP with biliary stent 02/2017, C. difficile colitis 02/2017 and colon polyps. S/P cholecystectomy 1982.  She was last seen in the office 10/24/2018 with complaints of epigastric pain, bloat and loose stools.  Labs done 02/22/2018 showed a Sodium 141.  Potassium 3.3.  Glucose 97.  BUN 5.  Creatinine 0.70.  Lipase 29.  AST 21.  ALT 14.  Alk phos 73.  Total bilirubin level 0.6.  WBC 13.5.  Hemoglobin 15.1.  Hematocrit 45.7.  Platelet 431.  GI panel was negative.  An abdominal/pelvic CT done 11/02/2018 showed diverticulosis without evidence of diverticulitis. Resolution of prior biliary ductal dilatation was noted. She was diagnosed with a UTI and treated with an antibiotic (most likely source of leukocytosis).  Florastor probiotic 1 capsule twice daily and within 24 hours her symptoms abated.  She denies having any further epigastric pain or bloat.  She has chronic loose stools since her gallbladder surgery, however, since taking the Florastor she is having less urgency with her bowel movements and her stool consistency is soft and no longer mud-like.  No rectal bleeding or melena.  No weight loss. She underwent a colonoscopy 12/2017 which showed 1 tubular adenomatous polyp to the cecum, 3 tubular adenomatous polyps to the mid transverse colon and 3 tubular adenomatous polyps at the splenic flexure, diverticulosis and external hemorrhoids. She was recommended to repeat a repeat colonoscopy in 5 years.  Her husband is present.   Review of Systems the HPI, all other systems reviewed and are negative     Objective:   Physical Exam  BP (!) 141/85   Pulse 82   Temp (!) 97.3 F (36.3 C) (Oral)   Ht _0  (1.422 m)   Wt 147 lb 14.4 oz (67.1 kg)   BMI 33.16 kg/m   General:  73 year old female well-developed in no acute distress Eyes: Sclera nonicteric, conjunctiva pink Heart: Regular rate and rhythm, no murmurs Lungs: Breath sounds clear throughout Abdomen: Soft, nontender, no masses or organomegaly.  Large right upper quadrant scar intact.  Central lower abdominal scar intact. Extremities: No edema Neuro: Alert and oriented x4, no focal deficits.     Assessment & Plan:   5.  73 year old female with epigastric and abdominal bloat, symptoms have abated.  Left teas normal. -Continue Florastor 1 p.o. twice daily -Follow-up as needed -Discussed eating a healthy diet, reduce fat intake  2.  IBS-D - See plan in  #1  3.  History of colon polyps Next colonoscopy due #2024

## 2019-01-09 ENCOUNTER — Other Ambulatory Visit (INDEPENDENT_AMBULATORY_CARE_PROVIDER_SITE_OTHER): Payer: Self-pay | Admitting: Internal Medicine

## 2019-03-21 ENCOUNTER — Ambulatory Visit (INDEPENDENT_AMBULATORY_CARE_PROVIDER_SITE_OTHER): Payer: Medicare Other | Admitting: Obstetrics and Gynecology

## 2019-03-21 ENCOUNTER — Other Ambulatory Visit (HOSPITAL_COMMUNITY)
Admission: RE | Admit: 2019-03-21 | Discharge: 2019-03-21 | Disposition: A | Discharge status: Home / Self Care | Payer: Medicare Other | Source: Ambulatory Visit | Attending: Obstetrics and Gynecology | Admitting: Obstetrics and Gynecology

## 2019-03-21 ENCOUNTER — Encounter: Payer: Self-pay | Admitting: Obstetrics and Gynecology

## 2019-03-21 ENCOUNTER — Other Ambulatory Visit: Payer: Self-pay

## 2019-03-21 VITALS — BP 137/89 | HR 90 | Ht <= 58 in | Wt 143.2 lb

## 2019-03-21 DIAGNOSIS — Z1211 Encounter for screening for malignant neoplasm of colon: Secondary | ICD-10-CM

## 2019-03-21 DIAGNOSIS — Z01419 Encounter for gynecological examination (general) (routine) without abnormal findings: Secondary | ICD-10-CM | POA: Insufficient documentation

## 2019-03-21 DIAGNOSIS — Z1151 Encounter for screening for human papillomavirus (HPV): Secondary | ICD-10-CM | POA: Insufficient documentation

## 2019-03-21 DIAGNOSIS — Z780 Asymptomatic menopausal state: Secondary | ICD-10-CM | POA: Insufficient documentation

## 2019-03-21 DIAGNOSIS — Z1212 Encounter for screening for malignant neoplasm of rectum: Secondary | ICD-10-CM | POA: Diagnosis not present

## 2019-03-21 LAB — HEMOCCULT GUIAC POC 1CARD (OFFICE): Fecal Occult Blood, POC: NEGATIVE

## 2019-03-21 NOTE — Addendum Note (Signed)
Addended by: Linton Rump on: 03/21/2019 03:31 PM   Modules accepted: Orders

## 2019-03-21 NOTE — Progress Notes (Signed)
Patient ID: Kathryn Meyers, female   DOB: Oct 30, 1945, 74 y.o.   MRN: [id number]  Assessment:  Annual Gyn Exam  Plan:  1. pap smear done next pap due in 5 years 2. return annually or prn 3    Annual mammogram advised after age 11 Subjective:  Kathryn Meyers is a 74 y.o. female No obstetric history on file. who presents for annual exam. No LMP recorded. Patient is postmenopausal. The patient has complaints today of none. She is sexually active and has been doing well. Her daughter Kathryn Meyers has signed a record deal. She is has a duo named [employer] and [employer].  We talked of nail-patella syndrome which she has, as her daughter has and 3 of her grandchildren The following portions of the patient's history were reviewed and updated as appropriate: allergies, current medications, past family history, past medical history, past social history, past surgical history and problem list. Past Medical History:  Diagnosis Date  . Anxiety   . Arthritis   . Glaucoma   . History of skin cancer   . Inappropriate sinus tachycardia   . 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 Shingles     Past Surgical History:  Procedure Laterality Date  . BILIARY STENT PLACEMENT N/A 03/10/2017   Procedure: BILIARY STENT PLACEMENT;  Surgeon: Rogene Houston, MD;  [facility];  Service: Gastroenterology;  Laterality: N/A;  . BIOPSY  06/30/2017   Procedure: BIOPSY AMPULA;  Surgeon: Rogene Houston, MD;  [facility];  Service: Gastroenterology;;  . CATARACT EXTRACTION Bilateral   . CESAREAN SECTION     x 4  . CHOLECYSTECTOMY  1982   cholecystitis  . COLONOSCOPY N/A 06/19/2014   Procedure: COLONOSCOPY;  Surgeon: Rogene Houston, MD;  [facility];  Service: Endoscopy;  Laterality: N/A;  730 - moved to 5/26 @ 12:00  . COLONOSCOPY N/A  09/26/2014   Procedure: COLONOSCOPY;  Surgeon: Rogene Houston, MD;  [facility];  Service: Endoscopy;  Laterality: N/A;  1020 - moved to 11:15 - [name] to notify pt  . COLONOSCOPY N/A 12/27/2017   Procedure: COLONOSCOPY;  Surgeon: Rogene Houston, MD;  [facility];  Service: Endoscopy;  Laterality: N/A;  12:00  . ERCP N/A 03/10/2017   Procedure: ENDOSCOPIC RETROGRADE CHOLANGIOPANCREATOGRAPHY (ERCP) With sphincterotomy and stone extraction.;  Surgeon: Rogene Houston, MD;  [facility];  Service: Gastroenterology;  Laterality: N/A;  . ERCP N/A 06/30/2017   Procedure: ENDOSCOPIC RETROGRADE CHOLANGIOPANCREATOGRAPHY (ERCP);  Surgeon: Rogene Houston, MD;  [facility];  Service: Endoscopy;  Laterality: N/A;  pt knows to arrive at 10:20  . ESOPHAGOGASTRODUODENOSCOPY ENDOSCOPY  06/30/2017   Procedure: ESOPHAGOGASTRODUODENOSCOPY ENDOSCOPY;  Surgeon: Rogene Houston, MD;  [facility];  Service: Endoscopy;;  . GASTROINTESTINAL STENT REMOVAL N/A 06/30/2017   Procedure: GASTROINTESTINAL STENT REMOVAL;  Surgeon: Rogene Houston, MD;  [facility];  Service: Endoscopy;  Laterality: N/A;  . Growth in ear removed     1994  . KNEE SURGERY Right   . POLYPECTOMY  12/27/2017   Procedure: POLYPECTOMY;  Surgeon: Rogene Houston, MD;  [facility];  Service: Endoscopy;;  polyps at splenic flexure x3, cecal polyp, transverse polyps x3  . REMOVAL OF STONES  06/30/2017   Procedure:  REMOVAL OF STONES FRAGMENTS ;  Surgeon: Rogene Houston, MD;  [facility];  Service: Gastroenterology;;  . rt knee replacement     2017  . SPHINCTEROTOMY N/A 03/10/2017   Procedure: SPHINCTEROTOMY;  Surgeon: Rogene Houston, MD;  [facility];  Service: Gastroenterology;  Laterality: N/A;  . stent eye Left   . STONE EXTRACTION WITH BASKET N/A 03/10/2017   Procedure: STONE EXTRACTION WITH BASKET;  Surgeon: Rogene Houston, MD;  [facility];  Service: Gastroenterology;  Laterality: N/A;  . TONSILLECTOMY     age 42     Current Outpatient Medications:  .  acetaminophen (TYLENOL) 500 MG tablet, Take 1,000 mg by mouth daily as needed for moderate pain. , Disp: , Rfl:  .  Cholecalciferol 5000 units capsule, Take 5,000 Units by mouth daily. , Disp: , Rfl:  .  Cyanocobalamin (VITAMIN B-12 IJ), Inject 1 Dose as directed every 30 (thirty) days. , Disp: , Rfl:  .  diazepam (VALIUM) 10 MG tablet, Take 2.5 mg by mouth at bedtime as needed for sleep., Disp: , Rfl:  .  famotidine (PEPCID) 20 MG tablet, TAKE 1 TABLET (20 MG TOTAL) BY MOUTH 2 (TWO) TIMES DAILY AS NEEDED FOR HEARTBURN OR INDIGESTION., Disp: 180 tablet, Rfl: 1 .  metoprolol succinate (TOPROL-XL) 50 MG 24 hr tablet, Take by mouth., Disp: , Rfl:  .  metoprolol tartrate (LOPRESSOR) 50 MG tablet, TAKE 1 TABLET BY MOUTH TWICE A DAY, Disp: 180 tablet, Rfl: 2 .  ondansetron (ZOFRAN) 4 MG tablet, Take 1 tablet (4 mg total) by mouth 2 (two) times daily as needed for nausea or vomiting., Disp: 20 tablet, Rfl: 1 .  Saccharomyces boulardii (FLORASTOR PO), Take 1 capsule by mouth as directed., Disp: , Rfl:  .  spironolactone (ALDACTONE) 25 MG tablet, Take 25 mg by mouth 2 (two) times daily. , Disp: , Rfl:  .  UNABLE TO FIND, Anora inhaler-once a month, Disp: , Rfl:   Review of Systems Constitutional: negative Gastrointestinal: negative Genitourinary: normal  Objective:  BP 137/89 (BP Location: Right Arm, Patient Position: Sitting, Cuff Size: Normal)   Pulse 90   Ht 4\' 8"  (1.422 m)   Wt 143 lb 3.2 oz (65 kg)   BMI 32.10 kg/m    BMI: Body mass index is 32.1 kg/m.  General Appearance: Alert, appropriate appearance for age. No acute distress HEENT: Grossly normal Neck / Thyroid:  Cardiovascular: RRR; normal S1, S2, no murmur Lungs: CTA bilaterally Back: No CVAT Breast Exam: Not done Gastrointestinal: Soft, non-tender, no masses or organomegaly well-healed midline vertical  scar from her 4 C-sections Pelvic Exam:  VAGINA: postmenopausal atrophic tissues CERVIX: well supported   UTERUS: uterus is normal size and nontender, deviated anteriorly.  May be adherent to the anterior abdominal wall based on how high it is RECTAL: guaiac negative stool obtained PAP: Pap smear done today. Lymphatic Exam: Non-palpable nodes in neck, clavicular, axillary, or inguinal regions Skin: no rash or abnormalities Neurologic: Normal gait and speech, no tremor  Psychiatric: Alert and oriented, appropriate affect.  Urinalysis:Not done  By signing my name below, I, Samul Dada, attest that this documentation has been prepared under the direction and in the presence of Jonnie Kind, MD. Electronically Signed: [place]. 03/21/19. 2:28 PM.  I personally performed the services described in this documentation, which was SCRIBED in my presence. The recorded information has been reviewed and considered accurate. It has been edited as necessary  during review. Jonnie Kind, MD

## 2019-03-26 LAB — CYTOLOGY - PAP
Comment: NEGATIVE
Diagnosis: NEGATIVE
High risk HPV: NEGATIVE

## 2019-03-27 ENCOUNTER — Other Ambulatory Visit: Payer: Self-pay

## 2019-03-27 ENCOUNTER — Encounter: Payer: Self-pay | Admitting: Urology

## 2019-03-27 ENCOUNTER — Ambulatory Visit (INDEPENDENT_AMBULATORY_CARE_PROVIDER_SITE_OTHER): Payer: Medicare Other | Admitting: Urology

## 2019-03-27 VITALS — BP 135/80 | HR 101 | Temp 97.0°F | Wt 143.0 lb

## 2019-03-27 DIAGNOSIS — R3129 Other microscopic hematuria: Secondary | ICD-10-CM | POA: Diagnosis not present

## 2019-03-27 DIAGNOSIS — R310 Gross hematuria: Secondary | ICD-10-CM | POA: Diagnosis not present

## 2019-03-27 NOTE — Progress Notes (Signed)
03/27/2019 3:52 PM   Alanna P Thomure [birth year] [id number]  Referring provider: Neale Burly, MD [address] [place],  [place] [id number]  Blood in urine  HPI: Kathryn Meyers is a 74yo here for evaluation gross hematuria and UTI. In here 30-40s she had multiple UTIs and yeast infections which then resolved after systemic antifungal therapy. She was doing well until 4 months ago when she had a UTI and treated with cipro. In Dec 2020 she urinated and wiped and noted blood when she wiped. No blood in the toilet. She then was prescribed bactrim and diflucan.   PMH: Past Medical History:  Diagnosis Date  . Acid reflux   . Anxiety   . Arthritis   . Glaucoma   . Gout   . History of skin cancer   . Inappropriate sinus tachycardia   . 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 Shingles     Surgical History: Past Surgical History:  Procedure Laterality Date  . BILIARY STENT PLACEMENT N/A 03/10/2017   Procedure: BILIARY STENT PLACEMENT;  Surgeon: Rogene Houston, MD;  [facility];  Service: Gastroenterology;  Laterality: N/A;  . BIOPSY  06/30/2017   Procedure: BIOPSY AMPULA;  Surgeon: Rogene Houston, MD;  [facility];  Service: Gastroenterology;;  . CATARACT EXTRACTION Bilateral   . CESAREAN SECTION     x 4  . CHOLECYSTECTOMY  1982   cholecystitis  . COLONOSCOPY N/A 06/19/2014   Procedure: COLONOSCOPY;  Surgeon: Rogene Houston, MD;  [facility];  Service: Endoscopy;  Laterality: N/A;  730 - moved to 5/26 @ 12:00  . COLONOSCOPY N/A 09/26/2014   Procedure: COLONOSCOPY;  Surgeon: Rogene Houston, MD;  [facility];  Service: Endoscopy;  Laterality: N/A;  1020 - moved to 11:15 - [name] to notify pt  . COLONOSCOPY N/A 12/27/2017   Procedure: COLONOSCOPY;  Surgeon: Rogene Houston, MD;  [facility];  Service: Endoscopy;  Laterality: N/A;  12:00  . ERCP N/A 03/10/2017   Procedure: ENDOSCOPIC RETROGRADE CHOLANGIOPANCREATOGRAPHY (ERCP) With sphincterotomy and stone extraction.;  Surgeon: Rogene Houston, MD;  [facility];  Service: Gastroenterology;  Laterality: N/A;  . ERCP N/A 06/30/2017   Procedure: ENDOSCOPIC RETROGRADE CHOLANGIOPANCREATOGRAPHY (ERCP);  Surgeon: Rogene Houston, MD;  [facility];  Service: Endoscopy;  Laterality: N/A;  pt knows to arrive at 10:20  . ESOPHAGOGASTRODUODENOSCOPY ENDOSCOPY  06/30/2017   Procedure: ESOPHAGOGASTRODUODENOSCOPY ENDOSCOPY;  Surgeon: Rogene Houston, MD;  [facility];  Service: Endoscopy;;  . GASTROINTESTINAL STENT REMOVAL N/A 06/30/2017   Procedure: GASTROINTESTINAL STENT REMOVAL;  Surgeon: Rogene Houston, MD;  [facility];  Service: Endoscopy;  Laterality: N/A;  . Growth in ear removed     1994  . KNEE SURGERY Right   . POLYPECTOMY  12/27/2017   Procedure: POLYPECTOMY;  Surgeon: Rogene Houston, MD;  [facility];  Service: Endoscopy;;  polyps at splenic flexure x3, cecal polyp, transverse polyps x3  . REMOVAL OF STONES  06/30/2017   Procedure: REMOVAL OF STONES FRAGMENTS ;  Surgeon: Rogene Houston, MD;  [facility];  Service: Gastroenterology;;  . rt knee replacement     2017  . SPHINCTEROTOMY N/A 03/10/2017   Procedure: SPHINCTEROTOMY;  Surgeon: Rogene Houston,  MD;  [facility];  Service: Gastroenterology;  Laterality: N/A;  . stent eye Left   . STONE EXTRACTION WITH BASKET N/A 03/10/2017   Procedure: STONE EXTRACTION WITH BASKET;  Surgeon: Rogene Houston, MD;  [facility];  Service: Gastroenterology;  Laterality: N/A;  . TONSILLECTOMY     age 40    Home Medications:  Allergies as of 03/27/2019      Reactions   Nsaids Other (See Comments)   Rectal bleeding.   Cortisone Other (See Comments)   Dizzy/flush/rapid heart beat.    Demerol [meperidine] Nausea And Vomiting      Medication List       Accurate as of March 27, 2019  3:52 PM. If you have any questions, ask your nurse or doctor.        acetaminophen 500 MG tablet Commonly known as: TYLENOL Take 1,000 mg by mouth daily as needed for moderate pain.   allopurinol 300 MG tablet Commonly known as: ZYLOPRIM Take 300 mg by mouth daily.   Cholecalciferol 125 MCG (5000 UT) capsule Take 5,000 Units by mouth daily.   Colcrys 0.6 MG tablet Generic drug: colchicine Take 0.6 mg by mouth 2 (two) times daily.   diazepam 10 MG tablet Commonly known as: VALIUM Take 2.5 mg by mouth at bedtime as needed for sleep.   famotidine 20 MG tablet Commonly known as: PEPCID TAKE 1 TABLET (20 MG TOTAL) BY MOUTH 2 (TWO) TIMES DAILY AS NEEDED FOR HEARTBURN OR INDIGESTION.   FLORASTOR PO Take 1 capsule by mouth as directed.   fluconazole 100 MG tablet Commonly known as: DIFLUCAN Take 200 mg by mouth daily.   metoprolol succinate 50 MG 24 hr tablet Commonly known as: TOPROL-XL Take by mouth.   metoprolol tartrate 50 MG tablet Commonly known as: LOPRESSOR TAKE 1 TABLET BY MOUTH TWICE A DAY   nystatin 100000 UNIT/ML suspension Commonly known as: MYCOSTATIN Take 15 mLs by mouth 2 (two) times daily.   ondansetron 4 MG tablet Commonly known as: ZOFRAN Take 1 tablet (4 mg total) by mouth 2 (two) times daily as needed for nausea or vomiting.   oxyCODONE 5 MG immediate release tablet Commonly known as: Oxy IR/ROXICODONE Take 5 mg by mouth daily as needed.   predniSONE 10 MG tablet Commonly known as: DELTASONE Take 10 mg by mouth daily.   spironolactone 25 MG tablet Commonly known as: ALDACTONE Take 25 mg by mouth 2 (two) times daily.   UNABLE TO FIND Anora inhaler-once a month   VITAMIN B-12 IJ Inject 1 Dose as directed every 30 (thirty) days.       Allergies:  Allergies  Allergen Reactions  . Nsaids Other (See Comments)    Rectal bleeding.  .  Cortisone Other (See Comments)    Dizzy/flush/rapid heart beat.  . Demerol [Meperidine] Nausea And Vomiting    Family History: Family History  Problem Relation Age of Onset  . Dementia Mother   . Alcoholism Father     Social History:  reports that she has been smoking cigarettes. She started smoking about 42 years ago. She has a 51.00 pack-year smoking history. She has never used smokeless tobacco. She reports that she does not drink alcohol or use drugs.  ROS: All other review of systems were reviewed and are negative except what is noted above in HPI  Physical Exam: BP 135/80   Pulse (!) 101   Temp (!) 97 F (36.1 C)   Wt 143 lb (64.9  kg)   BMI 32.06 kg/m   Constitutional:  Alert and oriented, No acute distress. HEENT: [place] AT, moist mucus membranes.  Trachea midline, no masses. Cardiovascular: No clubbing, cyanosis, or edema. Respiratory: Normal respiratory effort, no increased work of breathing. GI: Abdomen is soft, nontender, nondistended, no abdominal masses GU: No CVA tenderness. Moderate vaginal atrophy, moderate labial edema. Small urethral caruncle 6 oclock. No cystocele, rectocele, enterocele.  Lymph: No cervical or inguinal lymphadenopathy. Skin: No rashes, bruises or suspicious lesions. Neurologic: Grossly intact, no focal deficits, moving all 4 extremities. Psychiatric: Normal mood and affect.  Laboratory Data: Lab Results  Component Value Date   WBC 13.5 (H) 10/24/2018   HGB 15.1 10/24/2018   HCT 45.7 (H) 10/24/2018   MCV 88.7 10/24/2018   PLT 431 (H) 10/24/2018    Lab Results  Component Value Date   CREATININE 0.70 10/24/2018    No results found for: PSA  No results found for: TESTOSTERONE  No results found for: HGBA1C  Urinalysis    Component Value Date/Time   COLORURINE YELLOW 03/08/2017 1027   APPEARANCEUR HAZY (A) 03/08/2017 1027   LABSPEC 1.014 03/08/2017 1027   PHURINE 5.0 03/08/2017 1027   GLUCOSEU NEGATIVE 03/08/2017 1027   HGBUR  NEGATIVE 03/08/2017 1027   BILIRUBINUR NEGATIVE 03/08/2017 1027   KETONESUR NEGATIVE 03/08/2017 1027   PROTEINUR 30 (A) 03/08/2017 1027   UROBILINOGEN 0.2 07/22/2010 1314   NITRITE NEGATIVE 03/08/2017 1027   LEUKOCYTESUR NEGATIVE 03/08/2017 1027    Lab Results  Component Value Date   BACTERIA RARE (A) 03/08/2017    Pertinent Imaging: CT abd/pelvis 10/2018: No GU abnormalities. Images reviewed and discussed with patient  No results found for this or any previous visit. No results found for this or any previous visit. No results found for this or any previous visit. No results found for this or any previous visit. No results found for this or any previous visit. No results found for this or any previous visit. No results found for this or any previous visit. No results found for this or any previous visit.  Assessment & Plan:    1. Gross hematuria Likely related to urethral caruncle. Premarin 3x per week for 3 months. Risks/benefits/alternaitves to estrogen therapy discussed.    No follow-ups on file.  Nicolette Bang, MD  [facility] [place]

## 2019-03-27 NOTE — Progress Notes (Signed)
Urological Symptom Review  Patient is experiencing the following symptoms: Hard to postpone urine Leakage of urine Blood in urine   Review of Systems  Gastrointestinal (upper)  : Nausea Indigestion/heartburn  Gastrointestinal (lower) : Diarrhea  Constitutional : Fatigue  Skin: Negative for skin symptoms  Eyes: Negative for eye symptoms  Ear/Nose/Throat : Negative for Ear/Nose/Throat symptoms  Hematologic/Lymphatic: Negative for Hematologic/Lymphatic symptoms  Cardiovascular : Negative for cardiovascular symptoms  Respiratory : Shortness of breath  Endocrine: Negative for endocrine symptoms  Musculoskeletal: Back pain Joint pain  Neurological: Negative for neurological symptoms  Psychologic: Anxiety

## 2019-03-27 NOTE — Patient Instructions (Signed)
Hematuria, Adult Hematuria is blood in the urine. Blood may be visible in the urine, or it may be identified with a test. This condition can be caused by infections of the bladder, urethra, kidney, or prostate. Other possible causes include:  Kidney stones.  Cancer of the urinary tract.  Too much calcium in the urine.  Conditions that are passed from parent to child (inherited conditions).  Exercise that requires a lot of energy. Infections can usually be treated with medicine, and a kidney stone usually will pass through your urine. If neither of these is the cause of your hematuria, more tests may be needed to identify the cause of your symptoms. It is very important to tell your health care provider about any blood in your urine, even if it is painless or the blood stops without treatment. Blood in the urine, when it happens and then stops and then happens again, can be a symptom of a very serious condition, including cancer. There is no pain in the initial stages of many urinary cancers. Follow these instructions at home: Medicines  Take over-the-counter and prescription medicines only as told by your health care provider.  If you were prescribed an antibiotic medicine, take it as told by your health care provider. Do not stop taking the antibiotic even if you start to feel better. Eating and drinking  Drink enough fluid to keep your urine clear or pale yellow. It is recommended that you drink 3-4 quarts (2.8-3.8 L) a day. If you have been diagnosed with an infection, it is recommended that you drink cranberry juice in addition to large amounts of water.  Avoid caffeine, tea, and carbonated beverages. These tend to irritate the bladder.  Avoid alcohol because it may irritate the prostate (men). General instructions  If you have been diagnosed with a kidney stone, follow your health care provider's instructions about straining your urine to catch the stone.  Empty your bladder  often. Avoid holding urine for long periods of time.  If you are female: ? After a bowel movement, wipe from front to back and use each piece of toilet paper only once. ? Empty your bladder before and after sex.  Pay attention to any changes in your symptoms. Tell your health care provider about any changes or any new symptoms.  It is your responsibility to get your test results. Ask your health care provider, or the department performing the test, when your results will be ready.  Keep all follow-up visits as told by your health care provider. This is important. Contact a health care provider if:  You develop back pain.  You have a fever.  You have nausea or vomiting.  Your symptoms do not improve after 3 days.  Your symptoms get worse. Get help right away if:  You develop severe vomiting and are unable take medicine without vomiting.  You develop severe pain in your back or abdomen even though you are taking medicine.  You pass a large amount of blood in your urine.  You pass blood clots in your urine.  You feel very weak or like you might faint.  You faint. Summary  Hematuria is blood in the urine. It has many possible causes.  It is very important that you tell your health care provider about any blood in your urine, even if it is painless or the blood stops without treatment.  Take over-the-counter and prescription medicines only as told by your health care provider.  Drink enough fluid to keep   your urine clear or pale yellow. This information is not intended to replace advice given to you by your health care provider. Make sure you discuss any questions you have with your health care provider. Document Revised: 06/06/2018 Document Reviewed: 02/13/2016 Elsevier Patient Education  2020 Elsevier Inc.  

## 2019-03-28 NOTE — Progress Notes (Signed)
NORMAL PAP AND negative HPV testing, so low risk . Eligible for q 5 yr paps. For patients above 65, the decision to discontinue screening Paps depends on whether the patient has had adequate prior screening, Life expectancy, and preferences, in a shared decision making.  Some experts continue to offer screening through age 72 years.

## 2019-05-09 ENCOUNTER — Telehealth (INDEPENDENT_AMBULATORY_CARE_PROVIDER_SITE_OTHER): Payer: Self-pay | Admitting: *Deleted

## 2019-05-09 NOTE — Telephone Encounter (Signed)
Patient's husband called and left message to call.  Patient was called . She states that Friday a week ago they were exposed to Covid 19. The following Wednesday she tested positive to the virus. She says they were given lots of stuff to take.  She is at the 9th day and she feels that she is dehydrated. Defiantly weak , N&V , No taste , hurting under her ribs , some diarrhea that is a yellow color and she thinks that it smells like stool did when she had C-Diff.  She has called local hospital and the triage nurse talked with her , and told her per the patient that she did not have dehydration symptoms , not to come there and to call  Her PCP.  Patient has not called her PCP as she is concerned that she will be ask to go to [facility] , and she is not comfortable there.  Patient was made aware that we do not have a provider in office. It was recommend that she go to Urgent Care on [address] ,or in [place]. Or if her symptoms worsened to go to nearest hospital ED.  Patient states that she will try and go to Urgent Care.

## 2019-05-11 ENCOUNTER — Telehealth: Payer: Self-pay | Admitting: Gastroenterology

## 2019-05-11 NOTE — Telephone Encounter (Signed)
Husband called.FAMILY ALL GOT COVID.  Wants 2nd opinion about CT. CALLED RADIOLOGY(DEHAY) TO DISCUSS. Mild bile dilation. NL HFP SEP 2020. ACUTE EDEMA IN [place] C/ W MILD COLITIS, INTRAMURAL FAT IN PROXIMAL COLON.  DISCUSSED CT FINDINGS WITH HUSBAND. WILL SPEAK WITH DR, HASANAJ IN THE AM.

## 2019-05-15 ENCOUNTER — Telehealth (INDEPENDENT_AMBULATORY_CARE_PROVIDER_SITE_OTHER): Payer: Self-pay | Admitting: *Deleted

## 2019-05-15 NOTE — Telephone Encounter (Signed)
[name] talked with the patient and the patient is aware that Dr.Rehman is off and his PA too. Patient will be called by the nurse on Monday 04/46/2021.

## 2019-05-15 NOTE — Telephone Encounter (Signed)
Patient called in - states she spent a week ay [facility] and was told she has colitis and she would like to talk to someone about this - please call [phone]

## 2019-05-18 NOTE — Telephone Encounter (Signed)
I think she they had down all result of covid (diarrhea colitis) and has follow up with PCP or surgeon she should keep.  I think I understood she has diverticulitis too and she needs to heal for 6 wks before we look again since she just had ct at [facility]--records on your desk--let me know if we need to work in or OK to wait

## 2019-05-20 NOTE — Telephone Encounter (Signed)
Dr.Rehman will be made aware of this when he returns.

## 2019-05-20 NOTE — Telephone Encounter (Signed)
Need to talk with Dr.Rehman.

## 2019-05-21 NOTE — Telephone Encounter (Signed)
We have rec'd records from the patient's visit to the ED and admission to [facility].  Once Dr.Rehman has reviewed we will contact the patient with his recommendations.

## 2019-05-25 NOTE — Telephone Encounter (Signed)
I reviewed patient's records from recent hospitalization at [facility]. Patient's acute illness appears to be secondary to Covid infection as no other etiology was found. She is at home and recovering. Follow-up appointment with Dr. Sherrie Sport next month. Patient will call office for appointment if she needs one.

## 2019-07-03 ENCOUNTER — Other Ambulatory Visit: Payer: Self-pay | Admitting: Cardiology

## 2019-07-08 ENCOUNTER — Ambulatory Visit: Payer: Medicare Other | Admitting: Urology

## 2019-07-18 ENCOUNTER — Other Ambulatory Visit (INDEPENDENT_AMBULATORY_CARE_PROVIDER_SITE_OTHER): Payer: Self-pay | Admitting: Internal Medicine

## 2019-07-24 ENCOUNTER — Encounter: Payer: Self-pay | Admitting: Urology

## 2019-07-24 ENCOUNTER — Other Ambulatory Visit: Payer: Self-pay

## 2019-07-24 ENCOUNTER — Ambulatory Visit (INDEPENDENT_AMBULATORY_CARE_PROVIDER_SITE_OTHER): Payer: Medicare Other | Admitting: Urology

## 2019-07-24 VITALS — BP 139/84 | HR 89 | Temp 97.3°F | Ht <= 58 in | Wt 138.0 lb

## 2019-07-24 DIAGNOSIS — N362 Urethral caruncle: Secondary | ICD-10-CM | POA: Diagnosis not present

## 2019-07-24 DIAGNOSIS — R310 Gross hematuria: Secondary | ICD-10-CM | POA: Diagnosis not present

## 2019-07-24 LAB — POCT URINALYSIS DIPSTICK
Glucose, UA: NEGATIVE
Ketones, UA: NEGATIVE
Leukocytes, UA: NEGATIVE
Nitrite, UA: NEGATIVE
Protein, UA: NEGATIVE
Spec Grav, UA: 1.025 (ref 1.010–1.025)
Urobilinogen, UA: NEGATIVE E.U./dL — AB
pH, UA: 5 (ref 5.0–8.0)

## 2019-07-24 NOTE — Progress Notes (Signed)
Urological Symptom Review  Patient is experiencing the following symptoms: None   Review of Systems  Gastrointestinal (upper)  : Negative for upper GI symptoms  Gastrointestinal (lower) : Diarrhea  Constitutional : Negative for symptoms  Skin: Negative for skin symptoms  Eyes: Negative for eye symptoms  Ear/Nose/Throat : Negative for Ear/Nose/Throat symptoms  Hematologic/Lymphatic: Negative for Hematologic/Lymphatic symptoms  Cardiovascular : Negative for cardiovascular symptoms  Respiratory : Shortness of breath  Endocrine: Negative for endocrine symptoms  Musculoskeletal: Back pain Joint pain  Neurological: Negative for neurological symptoms  Psychologic: Anxiety

## 2019-07-24 NOTE — Patient Instructions (Signed)
Hematuria, Adult Hematuria is blood in the urine. Blood may be visible in the urine, or it may be identified with a test. This condition can be caused by infections of the bladder, urethra, kidney, or prostate. Other possible causes include:  Kidney stones.  Cancer of the urinary tract.  Too much calcium in the urine.  Conditions that are passed from parent to child (inherited conditions).  Exercise that requires a lot of energy. Infections can usually be treated with medicine, and a kidney stone usually will pass through your urine. If neither of these is the cause of your hematuria, more tests may be needed to identify the cause of your symptoms. It is very important to tell your health care provider about any blood in your urine, even if it is painless or the blood stops without treatment. Blood in the urine, when it happens and then stops and then happens again, can be a symptom of a very serious condition, including cancer. There is no pain in the initial stages of many urinary cancers. Follow these instructions at home: Medicines  Take over-the-counter and prescription medicines only as told by your health care provider.  If you were prescribed an antibiotic medicine, take it as told by your health care provider. Do not stop taking the antibiotic even if you start to feel better. Eating and drinking  Drink enough fluid to keep your urine clear or pale yellow. It is recommended that you drink 3-4 quarts (2.8-3.8 L) a day. If you have been diagnosed with an infection, it is recommended that you drink cranberry juice in addition to large amounts of water.  Avoid caffeine, tea, and carbonated beverages. These tend to irritate the bladder.  Avoid alcohol because it may irritate the prostate (men). General instructions  If you have been diagnosed with a kidney stone, follow your health care provider's instructions about straining your urine to catch the stone.  Empty your bladder  often. Avoid holding urine for long periods of time.  If you are female: ? After a bowel movement, wipe from front to back and use each piece of toilet paper only once. ? Empty your bladder before and after sex.  Pay attention to any changes in your symptoms. Tell your health care provider about any changes or any new symptoms.  It is your responsibility to get your test results. Ask your health care provider, or the department performing the test, when your results will be ready.  Keep all follow-up visits as told by your health care provider. This is important. Contact a health care provider if:  You develop back pain.  You have a fever.  You have nausea or vomiting.  Your symptoms do not improve after 3 days.  Your symptoms get worse. Get help right away if:  You develop severe vomiting and are unable take medicine without vomiting.  You develop severe pain in your back or abdomen even though you are taking medicine.  You pass a large amount of blood in your urine.  You pass blood clots in your urine.  You feel very weak or like you might faint.  You faint. Summary  Hematuria is blood in the urine. It has many possible causes.  It is very important that you tell your health care provider about any blood in your urine, even if it is painless or the blood stops without treatment.  Take over-the-counter and prescription medicines only as told by your health care provider.  Drink enough fluid to keep   your urine clear or pale yellow. This information is not intended to replace advice given to you by your health care provider. Make sure you discuss any questions you have with your health care provider. Document Revised: 06/06/2018 Document Reviewed: 02/13/2016 Elsevier Patient Education  2020 Elsevier Inc.  

## 2019-07-24 NOTE — Progress Notes (Signed)
07/24/2019 3:01 PM   Kathryn Meyers [birth year] [id number]  Referring provider: Neale Burly, MD [address] [place],  [place] [address]  Gross hematuria  HPI: Kathryn Meyers is a 74yo here for followup for gross hematuria. She was using premarin for 3 weeks and the vaginal irritation and and spotting when she wipes had resolved. She then stopped the the cream and vaginal irritation returned. She then restarted the premarin 3x per week. No LUTS. No gross hematuria   PMH: Past Medical History:  Diagnosis Date  . Acid reflux   . Anxiety   . Arthritis   . Colitis   . Glaucoma   . Gout   . History of skin cancer   . Inappropriate sinus tachycardia   . 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 Shingles     Surgical History: Past Surgical History:  Procedure Laterality Date  . BILIARY STENT PLACEMENT N/A 03/10/2017   Procedure: BILIARY STENT PLACEMENT;  Surgeon: Rogene Houston, MD;  [facility];  Service: Gastroenterology;  Laterality: N/A;  . BIOPSY  06/30/2017   Procedure: BIOPSY AMPULA;  Surgeon: Rogene Houston, MD;  [facility];  Service: Gastroenterology;;  . CATARACT EXTRACTION Bilateral   . CESAREAN SECTION     x 4  . CHOLECYSTECTOMY  1982   cholecystitis  . COLONOSCOPY N/A 06/19/2014   Procedure: COLONOSCOPY;  Surgeon: Rogene Houston, MD;  [facility];  Service: Endoscopy;  Laterality: N/A;  730 - moved to 5/26 @ 12:00  . COLONOSCOPY N/A 09/26/2014   Procedure: COLONOSCOPY;  Surgeon: Rogene Houston, MD;  [facility];  Service: Endoscopy;  Laterality: N/A;  1020 - moved to 11:15 - [name] to notify pt  . COLONOSCOPY N/A 12/27/2017   Procedure: COLONOSCOPY;  Surgeon: Rogene Houston, MD;  [facility];  Service: Endoscopy;  Laterality: N/A;  12:00  . ERCP N/A 03/10/2017    Procedure: ENDOSCOPIC RETROGRADE CHOLANGIOPANCREATOGRAPHY (ERCP) With sphincterotomy and stone extraction.;  Surgeon: Rogene Houston, MD;  [facility];  Service: Gastroenterology;  Laterality: N/A;  . ERCP N/A 06/30/2017   Procedure: ENDOSCOPIC RETROGRADE CHOLANGIOPANCREATOGRAPHY (ERCP);  Surgeon: Rogene Houston, MD;  [facility];  Service: Endoscopy;  Laterality: N/A;  pt knows to arrive at 10:20  . ESOPHAGOGASTRODUODENOSCOPY ENDOSCOPY  06/30/2017   Procedure: ESOPHAGOGASTRODUODENOSCOPY ENDOSCOPY;  Surgeon: Rogene Houston, MD;  [facility];  Service: Endoscopy;;  . GASTROINTESTINAL STENT REMOVAL N/A 06/30/2017   Procedure: GASTROINTESTINAL STENT REMOVAL;  Surgeon: Rogene Houston, MD;  [facility];  Service: Endoscopy;  Laterality: N/A;  . Growth in ear removed     1994  . KNEE SURGERY Right   . POLYPECTOMY  12/27/2017   Procedure: POLYPECTOMY;  Surgeon: Rogene Houston, MD;  [facility];  Service: Endoscopy;;  polyps at splenic flexure x3, cecal polyp, transverse polyps x3  . REMOVAL OF STONES  06/30/2017   Procedure: REMOVAL OF STONES FRAGMENTS ;  Surgeon: Rogene Houston, MD;  [facility];  Service: Gastroenterology;;  . rt knee replacement     2017  . SPHINCTEROTOMY N/A 03/10/2017   Procedure: SPHINCTEROTOMY;  Surgeon: Rogene Houston, MD;  [facility];  Service: Gastroenterology;  Laterality: N/A;  . stent  eye Left   . STONE EXTRACTION WITH BASKET N/A 03/10/2017   Procedure: STONE EXTRACTION WITH BASKET;  Surgeon: Rogene Houston, MD;  [facility];  Service: Gastroenterology;  Laterality: N/A;  . TONSILLECTOMY     age 32    Home Medications:  Allergies as of 07/24/2019      Reactions   Nsaids Other (See Comments)   Rectal bleeding.   Cortisone Other (See Comments)   Dizzy/flush/rapid heart beat.   Demerol [meperidine] Nausea And Vomiting      Medication List       Accurate as  of July 24, 2019  3:01 PM. If you have any questions, ask your nurse or doctor.        acetaminophen 500 MG tablet Commonly known as: TYLENOL Take 1,000 mg by mouth daily as needed for moderate pain.   albuterol 108 (90 Base) MCG/ACT inhaler Commonly known as: VENTOLIN HFA SMARTSIG:1 Puff(s) Via Inhaler Every 4 Hours PRN   allopurinol 300 MG tablet Commonly known as: ZYLOPRIM Take 300 mg by mouth daily.   Cholecalciferol 125 MCG (5000 UT) capsule Take 5,000 Units by mouth daily.   Colcrys 0.6 MG tablet Generic drug: colchicine Take 0.6 mg by mouth 2 (two) times daily.   diazepam 10 MG tablet Commonly known as: VALIUM Take 2.5 mg by mouth at bedtime as needed for sleep.   famotidine 20 MG tablet Commonly known as: PEPCID TAKE 1 TABLET (20 MG TOTAL) BY MOUTH 2 (TWO) TIMES DAILY AS NEEDED FOR HEARTBURN OR INDIGESTION.   FLORASTOR PO Take 1 capsule by mouth as directed.   fluconazole 100 MG tablet Commonly known as: DIFLUCAN Take 200 mg by mouth daily.   metoprolol succinate 50 MG 24 hr tablet Commonly known as: TOPROL-XL Take by mouth.   metoprolol tartrate 50 MG tablet Commonly known as: LOPRESSOR TAKE 1 TABLET BY MOUTH TWICE A DAY   nystatin 100000 UNIT/ML suspension Commonly known as: MYCOSTATIN Take 15 mLs by mouth 2 (two) times daily.   ondansetron 4 MG tablet Commonly known as: ZOFRAN Take 1 tablet (4 mg total) by mouth 2 (two) times daily as needed for nausea or vomiting.   oxyCODONE 5 MG immediate release tablet Commonly known as: Oxy IR/ROXICODONE Take 5 mg by mouth daily as needed.   predniSONE 10 MG tablet Commonly known as: DELTASONE Take 10 mg by mouth daily.   spironolactone 25 MG tablet Commonly known as: ALDACTONE Take 25 mg by mouth 2 (two) times daily.   TRELEGY ELLIPTA IN Inhale into the lungs.   triamcinolone cream 0.1 % Commonly known as: KENALOG APPLY TO AFFECTED AREA UP TO TWICE A DAY AS NEEDED. (NOT FACE,GROIN, OR  UNDERARMS)   UNABLE TO FIND Anora inhaler-once a month   VITAMIN B-12 IJ Inject 1 Dose as directed every 30 (thirty) days.   Vitamin D (Ergocalciferol) 1.25 MG (50000 UNIT) Caps capsule Commonly known as: DRISDOL Take 50,000 Units by mouth every 7 (seven) days.       Allergies:  Allergies  Allergen Reactions  . Nsaids Other (See Comments)    Rectal bleeding.  . Cortisone Other (See Comments)    Dizzy/flush/rapid heart beat.  . Demerol [Meperidine] Nausea And Vomiting    Family History: Family History  Problem Relation Age of Onset  . Dementia Mother   . Alcoholism Father     Social History:  reports that she has been smoking cigarettes. She started smoking about 42 years ago. She has a  51.00 pack-year smoking history. She has never used smokeless tobacco. She reports that she does not drink alcohol and does not use drugs.  ROS: All other review of systems were reviewed and are negative except what is noted above in HPI  Physical Exam: BP 139/84   Pulse 89   Temp (!) 97.3 F (36.3 C)   Ht 4\' 1"  (1.245 m)   Wt 138 lb (62.6 kg)   BMI 40.41 kg/m   Constitutional:  Alert and oriented, No acute distress. HEENT: [place] AT, moist mucus membranes.  Trachea midline, no masses. Cardiovascular: No clubbing, cyanosis, or edema. Respiratory: Normal respiratory effort, no increased work of breathing. GI: Abdomen is soft, nontender, nondistended, no abdominal masses GU: No CVA tenderness.  Lymph: No cervical or inguinal lymphadenopathy. Skin: No rashes, bruises or suspicious lesions. Neurologic: Grossly intact, no focal deficits, moving all 4 extremities. Psychiatric: Normal mood and affect.  Laboratory Data: Lab Results  Component Value Date   WBC 13.5 (H) 10/24/2018   HGB 15.1 10/24/2018   HCT 45.7 (H) 10/24/2018   MCV 88.7 10/24/2018   PLT 431 (H) 10/24/2018    Lab Results  Component Value Date   CREATININE 0.70 10/24/2018    No results found for: PSA  No  results found for: TESTOSTERONE  No results found for: HGBA1C  Urinalysis    Component Value Date/Time   COLORURINE YELLOW 03/08/2017 1027   APPEARANCEUR HAZY (A) 03/08/2017 1027   LABSPEC 1.014 03/08/2017 1027   PHURINE 5.0 03/08/2017 1027   GLUCOSEU NEGATIVE 03/08/2017 1027   HGBUR NEGATIVE 03/08/2017 1027   BILIRUBINUR small 07/24/2019 1446   KETONESUR NEGATIVE 03/08/2017 1027   PROTEINUR Negative 07/24/2019 1446   PROTEINUR 30 (A) 03/08/2017 1027   UROBILINOGEN negative (A) 07/24/2019 1446   UROBILINOGEN 0.2 07/22/2010 1314   NITRITE neg 07/24/2019 1446   NITRITE NEGATIVE 03/08/2017 1027   LEUKOCYTESUR Negative 07/24/2019 1446    Lab Results  Component Value Date   BACTERIA RARE (A) 03/08/2017    Pertinent Imaging:  No results found for this or any previous visit.  No results found for this or any previous visit.  No results found for this or any previous visit.  No results found for this or any previous visit.  No results found for this or any previous visit.  No results found for this or any previous visit.  No results found for this or any previous visit.  No results found for this or any previous visit.   Assessment & Plan:    1. Gross hematuria -resolved - POCT urinalysis dipstick  2. Urethral caruncle Continue premarin 3x per week.    No follow-ups on file.  Nicolette Bang, MD  [facility] [place]

## 2019-10-02 ENCOUNTER — Other Ambulatory Visit: Payer: Self-pay | Admitting: Cardiology

## 2019-10-23 ENCOUNTER — Encounter: Payer: Self-pay | Admitting: Urology

## 2019-10-23 ENCOUNTER — Ambulatory Visit (INDEPENDENT_AMBULATORY_CARE_PROVIDER_SITE_OTHER): Payer: Medicare Other | Admitting: Urology

## 2019-10-23 ENCOUNTER — Other Ambulatory Visit: Payer: Self-pay

## 2019-10-23 VITALS — BP 122/72 | HR 82 | Temp 98.2°F | Ht <= 58 in | Wt 138.0 lb

## 2019-10-23 DIAGNOSIS — R310 Gross hematuria: Secondary | ICD-10-CM | POA: Diagnosis not present

## 2019-10-23 LAB — BLADDER SCAN AMB NON-IMAGING: Scan Result: 36

## 2019-10-23 LAB — URINALYSIS, ROUTINE W REFLEX MICROSCOPIC
Bilirubin, UA: NEGATIVE
Glucose, UA: NEGATIVE
Ketones, UA: NEGATIVE
Leukocytes,UA: NEGATIVE
Nitrite, UA: NEGATIVE
Protein,UA: NEGATIVE
Specific Gravity, UA: 1.015 (ref 1.005–1.030)
Urobilinogen, Ur: 0.2 mg/dL (ref 0.2–1.0)
pH, UA: 5.5 (ref 5.0–7.5)

## 2019-10-23 LAB — MICROSCOPIC EXAMINATION
Epithelial Cells (non renal): >10 /hpf — AB (ref 0–10)
Renal Epithel, UA: NONE SEEN /hpf

## 2019-10-23 MED ORDER — PREMARIN 0.625 MG/GM VA CREA: 1.0000 | TOPICAL_CREAM | Freq: Every day | VAGINAL | 12 refills | Status: DC

## 2019-10-23 NOTE — Progress Notes (Signed)
10/23/2019 3:36 PM   Kathryn Meyers 1945-05-10 [id number]  Referring provider: Neale Burly, MD [address] [place],  [place] [address]  Gross hematuria  HPI: Kathryn Meyers is a 74yo here for followup for gross hematuria and urethral caruncle. No blood spotting since last visit. She uses premarin 3x per week. No worsening LUTS.   PMH: Past Medical History:  Diagnosis Date  . Acid reflux   . Anxiety   . Arthritis   . Colitis   . Glaucoma   . Gout   . History of skin cancer   . Inappropriate sinus tachycardia   . 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 Shingles     Surgical History: Past Surgical History:  Procedure Laterality Date  . BILIARY STENT PLACEMENT N/A 03/10/2017   Procedure: BILIARY STENT PLACEMENT;  Surgeon: Rogene Houston, MD;  [facility];  Service: Gastroenterology;  Laterality: N/A;  . BIOPSY  06/30/2017   Procedure: BIOPSY AMPULA;  Surgeon: Rogene Houston, MD;  [facility];  Service: Gastroenterology;;  . CATARACT EXTRACTION Bilateral   . CESAREAN SECTION     x 4  . CHOLECYSTECTOMY  1982   cholecystitis  . COLONOSCOPY N/A 06/19/2014   Procedure: COLONOSCOPY;  Surgeon: Rogene Houston, MD;  [facility];  Service: Endoscopy;  Laterality: N/A;  730 - moved to 5/26 @ 12:00  . COLONOSCOPY N/A 09/26/2014   Procedure: COLONOSCOPY;  Surgeon: Rogene Houston, MD;  [facility];  Service: Endoscopy;  Laterality: N/A;  1020 - moved to 11:15 - [name] to notify pt  . COLONOSCOPY N/A 12/27/2017   Procedure: COLONOSCOPY;  Surgeon: Rogene Houston, MD;  [facility];  Service: Endoscopy;  Laterality: N/A;  12:00  . ERCP N/A 03/10/2017   Procedure: ENDOSCOPIC RETROGRADE CHOLANGIOPANCREATOGRAPHY (ERCP) With sphincterotomy and stone extraction.;  Surgeon: Rogene Houston, MD;  [facility];  Service: Gastroenterology;  Laterality: N/A;  . ERCP N/A 06/30/2017   Procedure: ENDOSCOPIC RETROGRADE CHOLANGIOPANCREATOGRAPHY (ERCP);  Surgeon: Rogene Houston, MD;  [facility];  Service: Endoscopy;  Laterality: N/A;  pt knows to arrive at 10:20  . ESOPHAGOGASTRODUODENOSCOPY ENDOSCOPY  06/30/2017   Procedure: ESOPHAGOGASTRODUODENOSCOPY ENDOSCOPY;  Surgeon: Rogene Houston, MD;  [facility];  Service: Endoscopy;;  . GASTROINTESTINAL STENT REMOVAL N/A 06/30/2017   Procedure: GASTROINTESTINAL STENT REMOVAL;  Surgeon: Rogene Houston, MD;  [facility];  Service: Endoscopy;  Laterality: N/A;  . Growth in ear removed     1994  . KNEE SURGERY Right   . POLYPECTOMY  12/27/2017   Procedure: POLYPECTOMY;  Surgeon: Rogene Houston, MD;  [facility];  Service: Endoscopy;;  polyps at splenic flexure x3, cecal polyp, transverse polyps x3  . REMOVAL OF STONES  06/30/2017   Procedure: REMOVAL OF STONES FRAGMENTS ;  Surgeon: Rogene Houston, MD;  [facility];  Service: Gastroenterology;;  . rt knee replacement     2017  . SPHINCTEROTOMY N/A 03/10/2017   Procedure: SPHINCTEROTOMY;  Surgeon: Rogene Houston, MD;  [facility];  Service: Gastroenterology;  Laterality: N/A;  . stent eye Left   . STONE EXTRACTION WITH BASKET N/A 03/10/2017   Procedure: STONE EXTRACTION WITH BASKET;  Surgeon: Rogene Houston, MD;  [facility];  Service: Gastroenterology;  Laterality: N/A;  . TONSILLECTOMY     age 54    Home Medications:  Allergies as of 10/23/2019      Reactions   Nsaids Other (See Comments)   Rectal bleeding.   Cortisone Other (See Comments)   Dizzy/flush/rapid heart beat.   Demerol [meperidine] Nausea And Vomiting      Medication List       Accurate as of October 23, 2019  3:36 PM. If you have any questions, ask your nurse or doctor.        acetaminophen 500 MG tablet Commonly  known as: TYLENOL Take 1,000 mg by mouth daily as needed for moderate pain.   albuterol 108 (90 Base) MCG/ACT inhaler Commonly known as: VENTOLIN HFA SMARTSIG:1 Puff(s) Via Inhaler Every 4 Hours PRN   allopurinol 300 MG tablet Commonly known as: ZYLOPRIM Take 300 mg by mouth daily.   Cholecalciferol 125 MCG (5000 UT) capsule Take 5,000 Units by mouth daily.   Colcrys 0.6 MG tablet Generic drug: colchicine Take 0.6 mg by mouth 2 (two) times daily.   diazepam 10 MG tablet Commonly known as: VALIUM Take 2.5 mg by mouth at bedtime as needed for sleep.   famotidine 20 MG tablet Commonly known as: PEPCID TAKE 1 TABLET (20 MG TOTAL) BY MOUTH 2 (TWO) TIMES DAILY AS NEEDED FOR HEARTBURN OR INDIGESTION.   FLORASTOR PO Take 1 capsule by mouth as directed.   fluconazole 100 MG tablet Commonly known as: DIFLUCAN Take 200 mg by mouth daily.   fluconazole 150 MG tablet Commonly known as: DIFLUCAN Take 1 tablet by mouth once for one dose.  May repeat in 7 days if symptoms persist.   metoprolol succinate 50 MG 24 hr tablet Commonly known as: TOPROL-XL Take by mouth.   metoprolol tartrate 50 MG tablet Commonly known as: LOPRESSOR TAKE 1 TABLET BY MOUTH TWICE A DAY   nystatin 100000 UNIT/ML suspension Commonly known as: MYCOSTATIN Take 15 mLs by mouth 2 (two) times daily.   ondansetron 4 MG tablet Commonly known as: ZOFRAN Take 1 tablet (4 mg total) by mouth 2 (two) times daily as needed for nausea or vomiting.   oxyCODONE 5 MG immediate release tablet Commonly known as: Oxy IR/ROXICODONE Take 5 mg by mouth daily as needed.   predniSONE 10 MG tablet Commonly known as: DELTASONE Take 10 mg by mouth daily.   spironolactone 25 MG tablet Commonly known as: ALDACTONE Take 25 mg by mouth 2 (two) times daily.   TRELEGY ELLIPTA IN Inhale into the lungs.   triamcinolone cream 0.1 % Commonly known as: KENALOG APPLY TO AFFECTED AREA UP TO TWICE A DAY AS NEEDED. (NOT  FACE,GROIN, OR UNDERARMS)   UNABLE TO FIND Anora inhaler-once a month   VITAMIN B-12 IJ Inject 1 Dose as directed every 30 (thirty) days.   cyanocobalamin 1000 MCG/ML injection Commonly known as: (VITAMIN B-12) SMARTSIG:1 Milliliter(s) IM   Vitamin D (Ergocalciferol) 1.25 MG (50000 UNIT) Caps capsule Commonly known as: DRISDOL Take 50,000 Units by mouth every 7 (seven) days.       Allergies:  Allergies  Allergen Reactions  . Nsaids Other (See Comments)    Rectal bleeding.  . Cortisone Other (See Comments)    Dizzy/flush/rapid heart beat.  . Demerol [Meperidine] Nausea And Vomiting    Family History: Family History  Problem Relation Age of Onset  . Dementia Mother   . Alcoholism Father     Social History:  reports that she has been smoking cigarettes. She started smoking about 42 years ago. She has a 51.00 pack-year smoking history. She has never used smokeless tobacco. She reports that she does not drink alcohol and does not use drugs.  ROS: All other review of systems were reviewed and are negative except what is noted above in HPI  Physical Exam: BP 122/72   Pulse 82   Temp 98.2 F (36.8 C)   Ht 4\' 9"  (1.448 m)   Wt 138 lb (62.6 kg)   BMI 29.86 kg/m   Constitutional:  Alert and oriented, No acute distress. HEENT: [place] AT, moist mucus membranes.  Trachea midline, no masses. Cardiovascular: No clubbing, cyanosis, or edema. Respiratory: Normal respiratory effort, no increased work of breathing. GI: Abdomen is soft, nontender, nondistended, no abdominal masses GU: No CVA tenderness.  Lymph: No cervical or inguinal lymphadenopathy. Skin: No rashes, bruises or suspicious lesions. Neurologic: Grossly intact, no focal deficits, moving all 4 extremities. Psychiatric: Normal mood and affect.  Laboratory Data: Lab Results  Component Value Date   WBC 13.5 (H) 10/24/2018   HGB 15.1 10/24/2018   HCT 45.7 (H) 10/24/2018   MCV 88.7 10/24/2018   PLT 431 (H)  10/24/2018    Lab Results  Component Value Date   CREATININE 0.70 10/24/2018    No results found for: PSA  No results found for: TESTOSTERONE  No results found for: HGBA1C  Urinalysis    Component Value Date/Time   COLORURINE YELLOW 03/08/2017 1027   APPEARANCEUR HAZY (A) 03/08/2017 1027   LABSPEC 1.014 03/08/2017 1027   PHURINE 5.0 03/08/2017 1027   GLUCOSEU NEGATIVE 03/08/2017 1027   HGBUR NEGATIVE 03/08/2017 1027   BILIRUBINUR small 07/24/2019 1446   KETONESUR NEGATIVE 03/08/2017 1027   PROTEINUR Negative 07/24/2019 1446   PROTEINUR 30 (A) 03/08/2017 1027   UROBILINOGEN negative (A) 07/24/2019 1446   UROBILINOGEN 0.2 07/22/2010 1314   NITRITE neg 07/24/2019 1446   NITRITE NEGATIVE 03/08/2017 1027   LEUKOCYTESUR Negative 07/24/2019 1446    Lab Results  Component Value Date   BACTERIA RARE (A) 03/08/2017    Pertinent Imaging:  No results found for this or any previous visit.  No results found for this or any previous visit.  No results found for this or any previous visit.  No results found for this or any previous visit.  No results found for this or any previous visit.  No results found for this or any previous visit.  No results found for this or any previous visit.  No results found for this or any previous visit.   Assessment & Plan:    1. Gross hematuria -resolved - Urinalysis, Routine w reflex microscopic - Bladder Scan (Post Void Residual) in office  2. Urethral caruncle -continue premarin 3x per week   Return in about 6 months (around 04/21/2020).  Nicolette Bang, MD  [facility] [place]

## 2019-10-23 NOTE — Patient Instructions (Signed)
Hematuria, Adult Hematuria is blood in the urine. Blood may be visible in the urine, or it may be identified with a test. This condition can be caused by infections of the bladder, urethra, kidney, or prostate. Other possible causes include:  Kidney stones.  Cancer of the urinary tract.  Too much calcium in the urine.  Conditions that are passed from parent to child (inherited conditions).  Exercise that requires a lot of energy. Infections can usually be treated with medicine, and a kidney stone usually will pass through your urine. If neither of these is the cause of your hematuria, more tests may be needed to identify the cause of your symptoms. It is very important to tell your health care provider about any blood in your urine, even if it is painless or the blood stops without treatment. Blood in the urine, when it happens and then stops and then happens again, can be a symptom of a very serious condition, including cancer. There is no pain in the initial stages of many urinary cancers. Follow these instructions at home: Medicines  Take over-the-counter and prescription medicines only as told by your health care provider.  If you were prescribed an antibiotic medicine, take it as told by your health care provider. Do not stop taking the antibiotic even if you start to feel better. Eating and drinking  Drink enough fluid to keep your urine clear or pale yellow. It is recommended that you drink 3-4 quarts (2.8-3.8 L) a day. If you have been diagnosed with an infection, it is recommended that you drink cranberry juice in addition to large amounts of water.  Avoid caffeine, tea, and carbonated beverages. These tend to irritate the bladder.  Avoid alcohol because it may irritate the prostate (men). General instructions  If you have been diagnosed with a kidney stone, follow your health care provider's instructions about straining your urine to catch the stone.  Empty your bladder  often. Avoid holding urine for long periods of time.  If you are female: ? After a bowel movement, wipe from front to back and use each piece of toilet paper only once. ? Empty your bladder before and after sex.  Pay attention to any changes in your symptoms. Tell your health care provider about any changes or any new symptoms.  It is your responsibility to get your test results. Ask your health care provider, or the department performing the test, when your results will be ready.  Keep all follow-up visits as told by your health care provider. This is important. Contact a health care provider if:  You develop back pain.  You have a fever.  You have nausea or vomiting.  Your symptoms do not improve after 3 days.  Your symptoms get worse. Get help right away if:  You develop severe vomiting and are unable take medicine without vomiting.  You develop severe pain in your back or abdomen even though you are taking medicine.  You pass a large amount of blood in your urine.  You pass blood clots in your urine.  You feel very weak or like you might faint.  You faint. Summary  Hematuria is blood in the urine. It has many possible causes.  It is very important that you tell your health care provider about any blood in your urine, even if it is painless or the blood stops without treatment.  Take over-the-counter and prescription medicines only as told by your health care provider.  Drink enough fluid to keep   your urine clear or pale yellow. This information is not intended to replace advice given to you by your health care provider. Make sure you discuss any questions you have with your health care provider. Document Revised: 06/06/2018 Document Reviewed: 02/13/2016 Elsevier Patient Education  2020 Elsevier Inc.  

## 2019-10-23 NOTE — Progress Notes (Signed)
anUrological Symptom Review  Patient is experiencing the following symptoms: Get up at night to urinate   Review of Systems  Gastrointestinal (upper)  : Indigestion/heartburn  Gastrointestinal (lower) : Diarrhea  Constitutional : Negative for symptoms  Skin: Negative for skin symptoms  Eyes: Negative for eye symptoms  Ear/Nose/Throat : Negative for Ear/Nose/Throat symptoms  Hematologic/Lymphatic: Negative for Hematologic/Lymphatic symptoms  Cardiovascular : Negative for cardiovascular symptoms  Respiratory : Shortness of breath  Endocrine: Negative for endocrine symptoms  Musculoskeletal: Back pain Joint pain  Neurological: Negative for neurological symptoms  Psychologic: Negative for psychiatric symptoms

## 2019-10-25 ENCOUNTER — Institutional Professional Consult (permissible substitution): Payer: Medicare Other | Admitting: Pulmonary Disease

## 2019-11-27 ENCOUNTER — Ambulatory Visit (INDEPENDENT_AMBULATORY_CARE_PROVIDER_SITE_OTHER): Payer: Medicare Other | Admitting: Internal Medicine

## 2019-11-27 ENCOUNTER — Other Ambulatory Visit: Payer: Self-pay

## 2019-11-27 ENCOUNTER — Encounter: Payer: Self-pay | Admitting: Internal Medicine

## 2019-11-27 DIAGNOSIS — R0600 Dyspnea, unspecified: Secondary | ICD-10-CM

## 2019-11-27 DIAGNOSIS — R0609 Other forms of dyspnea: Secondary | ICD-10-CM

## 2019-11-27 DIAGNOSIS — F17210 Nicotine dependence, cigarettes, uncomplicated: Secondary | ICD-10-CM | POA: Diagnosis not present

## 2019-11-27 NOTE — Patient Instructions (Addendum)
Only use your albuterol as a rescue medication to be used if you can't catch your breath by resting or doing a relaxed purse lip breathing pattern.  - The less you use it, the better it will work when you need it. - Ok to use up to 2 puffs  every 4 hours if you must but call for immediate appointment if use goes up over your usual need - Don't leave home without it !!  (think of it like the spare tire for your car)   Stop trelegy - if you get worse breathing off it then I would add back anoro one click daily   The key is to stop smoking completely before smoking completely stops you!   To get the most out of exercise, you need to be continuously aware that you are short of breath, but never out of breath, for 30 minutes daily. As you improve, it will actually be easier for you to do the same amount of exercise  in  30 minutes so always push to the level where you are short of breath.      I very strongly recommend you get the moderna or pfizer vaccine as soon as possible based on your risk of dying from the virus  and the proven safety and benefit of these vaccines against even the delta variant.  This can save your life as well as  those of your loved ones,  especially if they are also not vaccinated.   We need to schedule pfts to be complete the work up and I will call you with the results

## 2019-11-27 NOTE — Assessment & Plan Note (Signed)
Active smoker - worse p covid March 2021  -  11/27/2019   Walked RA  approx   600 ft  @ fast pace  stopped due to  [employer] with sats 99% at end    -  pfts ordered  11/27/2019 and call results, no f/u needed      I reviewed the Fletcher curve with the patient that basically indicates  if you quit smoking when your best day FEV1 is still well preserved (as is probably still the case here)  it is highly unlikely you will progress to severe disease and informed the patient there was  no medication on the market that has proven to alter the curve/ its downward trajectory  or the likelihood of progression of their disease(unlike other chronic medical conditions such as atheroclerosis where we do think we can change the natural hx with risk reducing meds)    Therefore stopping smoking and maintaining abstinence are  the most important aspects of care, not choice of inhalers or for that matter, doctors (see separate a/p)   Treatment other than smoking cessation  is entirely directed by severity of symptoms and focused also on reducing exacerbations, not attempting to change the natural history of the disease.  I'm not really convinced she is limited by copd nor benefiting from trelegy so ok to stop and use saba prn  I spent extra time with pt today reviewing appropriate use of albuterol for prn use on exertion with the following points: 1) saba is for relief of sob that does not improve by walking a slower pace or resting but rather if the pt does not improve after trying this first. 2) If the pt is convinced, as many are, that saba helps recover from activity faster then it's easy to tell if this is the case by re-challenging : ie stop, take the inhaler, then p 5 minutes try the exact same activity (intensity of workload) that just caused the symptoms and see if they are substantially diminished or not after saba 3) if there is an activity that reproducibly causes the symptoms, try the saba 15 min before the  activity on alternate days   If in fact the saba really does help, then fine to continue to use it prn but advised may need to look closer at the maintenance regimen being used to achieve better control of airways disease with exertion.    >>> f/u pfts to complete the w/u

## 2019-11-27 NOTE — Assessment & Plan Note (Signed)
Counseled re importance of smoking cessation but did not meet time criteria for separate billing    Pt informed of the seriousness of COVID 19 infection as a direct risk to lung health  and safey and to close contacts and should continue to wear a facemask in public and minimize exposure to public locations but especially avoid any area or activity where non-close contacts are not observing distancing or wearing an appropriate face mask.  I strongly recommended she take either of the vaccines available through local drugstores based on updated information on millions of Americans treated with the [place] products  which have proven both safe and  effective even against the new delta variant.     F/u is prn           Each maintenance medication was reviewed in detail including emphasizing most importantly the difference between maintenance and prns and under what circumstances the prns are to be triggered using an action plan format where appropriate.  Total time for H and P, chart review, counseling,  directly observing portions of ambulatory 02 saturation study/  and generating customized AVS unique to this office visit / charting > 60 min

## 2019-11-27 NOTE — Progress Notes (Signed)
Kathryn Meyers, female    DOB: 1945/09/23,    MRN: [id number]   Brief patient profile:  32 yowf active smoker/hair dresser  with mild doe prior to covid March 2021 rarely needed albuterol then admit [facility] hospital April 14th  2021 with mostly GI symptoms from [place] and "not right since"      History of Present Illness  11/27/2019  Pulmonary/ 1st office eval/Kathryn Meyers  Chief Complaint  Patient presents with  . Consult    shortness of breath with activity  Dyspnea:   Steps x one of her usual flights at home now has to stop at top and rest no better on trelegy  And it makes her mouth sore Can do [employer] s  stopping for breathing but slow pace Walking to feed horses sev hundred feet, minimal hill sob but doesn't have to stop   Cough: in am's only x up to an hour > thick white min vol Sleep: able to lie on side/ flat bed  SABA use: not using / makes heart race   No obvious day to day or daytime variability or assoc   purulent sputum or mucus plugs or hemoptysis or cp or chest tightness, subjective wheeze or overt sinus or hb symptoms.   Sleeping without nocturnal    exacerbation  of respiratory  c/o's or need for noct saba. Also denies any obvious fluctuation of symptoms with weather or environmental changes or other aggravating or alleviating factors except as outlined above   No unusual exposure hx or h/o childhood pna/ asthma or knowledge of premature birth.  Current Allergies, Complete Past Medical History, Past Surgical History, Family History, and Social History were reviewed in [employer] record.  ROS  The following are not active complaints unless bolded Hoarseness, sore throat, dysphagia, dental problems, itching, sneezing,  nasal congestion or discharge of excess mucus or purulent secretions, ear ache,   fever, chills, sweats, unintended wt loss or wt gain, classically pleuritic or exertional cp,  orthopnea pnd or arm/hand swelling  or leg swelling,  presyncope, palpitations, abdominal pain, anorexia, nausea, vomiting, diarrhea  or change in bowel habits or change in bladder habits, change in stools or change in urine, dysuria, hematuria,  rash, arthralgias, visual complaints, headache, numbness, weakness or ataxia or problems with walking or coordination,  change in mood or  memory.           Past Medical History:  Diagnosis Date  . Acid reflux   . Anxiety   . Arthritis   . Colitis   . Glaucoma   . Gout   . History of skin cancer   . Inappropriate sinus tachycardia   . 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 Shingles     Outpatient Medications Prior to Visit -  .- NOTE:   Unable to verify as accurately reflecting what pt takes     Medication Sig Dispense Refill  . acetaminophen (TYLENOL) 500 MG tablet Take 1,000 mg by mouth daily as needed for moderate pain.     Marland Kitchen albuterol (VENTOLIN HFA) 108 (90 Base) MCG/ACT inhaler SMARTSIG:1 Puff(s) Via Inhaler Every 4 Hours PRN    . Cholecalciferol 5000 units capsule Take 5,000 Units by mouth daily.     Marland Kitchen conjugated estrogens (PREMARIN) vaginal cream Place 1 Applicatorful vaginally daily. 42.5 g 12  . Cyanocobalamin (VITAMIN  B-12 IJ) Inject 1 Dose as directed every 30 (thirty) days.     . diazepam (VALIUM) 10 MG tablet Take 2.5 mg by mouth at bedtime as needed for sleep.    . famotidine (PEPCID) 20 MG tablet TAKE 1 TABLET (20 MG TOTAL) BY MOUTH 2 (TWO) TIMES DAILY AS NEEDED FOR HEARTBURN OR INDIGESTION. 180 tablet 1  . Fluticasone-Umeclidin-Vilant (TRELEGY ELLIPTA IN) Not taking regularly     . metoprolol tartrate (LOPRESSOR) 50 MG tablet TAKE 1 TABLET BY MOUTH TWICE A DAY 180 tablet 2  . oxyCODONE (OXY IR/ROXICODONE) 5 MG immediate release tablet Take 5 mg by mouth daily as needed.    Marland Kitchen spironolactone (ALDACTONE) 25 MG tablet Take 25 mg by mouth  daily.     Marland Kitchen triamcinolone cream (KENALOG) 0.1 % APPLY TO AFFECTED AREA UP TO TWICE A DAY AS NEEDED. (NOT FACE,GROIN, OR UNDERARMS)    . Vitamin D, Ergocalciferol, (DRISDOL) 1.25 MG (50000 UNIT) CAPS capsule Take 50,000 Units by mouth every 7 (seven) days.    . cyanocobalamin (,VITAMIN B-12,) 1000 MCG/ML injection SMARTSIG:1 Milliliter(s) IM    . allopurinol (ZYLOPRIM) 300 MG tablet Take 300 mg by mouth daily.  (Patient not taking: Reported on 11/27/2019)    . Saccharomyces boulardii (FLORASTOR PO) Take 1 capsule by mouth as directed. (Patient not taking: Reported on 10/23/2019)    . UNABLE TO FIND Anora inhaler-once a month  (Patient not taking: Reported on 11/27/2019)    . COLCRYS 0.6 MG tablet Take 0.6 mg by mouth 2 (two) times daily.  (Patient not taking: Reported on 11/27/2019)    . fluconazole (DIFLUCAN) 100 MG tablet Take 200 mg by mouth daily.     . fluconazole (DIFLUCAN) 150 MG tablet Take 1 tablet by mouth once for one dose.  May repeat in 7 days if symptoms persist.    . metoprolol succinate (TOPROL-XL) 50 MG 24 hr tablet Take by mouth.  (Patient not taking: Reported on 10/23/2019)    . nystatin (MYCOSTATIN) 100000 UNIT/ML suspension Take 15 mLs by mouth 2 (two) times daily.  (Patient not taking: Reported on 10/23/2019)    . ondansetron (ZOFRAN) 4 MG tablet Take 1 tablet (4 mg total) by mouth 2 (two) times daily as needed for nausea or vomiting. (Patient not taking: Reported on 10/23/2019) 20 tablet 1  . predniSONE (DELTASONE) 10 MG tablet Take 10 mg by mouth daily.  (Patient not taking: Reported on 10/23/2019)        Objective:     BP (!) 148/82 (BP Location: Left Arm, Cuff Size: Normal)   Pulse 74   Temp (!) 97.1 F (36.2 C) (Other (Comment)) Comment (Src): wrist  Ht 4\' 8"  (1.422 m)   Wt 145 lb 3.2 oz (65.9 kg)   SpO2 97%   BMI 32.55 kg/m   SpO2: 97 %    Nl exam, very evasive hx   Obese amb wf very evasive historian  HEENT : pt wearing mask not removed for exam due to covid  - 19 concerns.   NECK :  without JVD/Nodes/TM/ nl carotid upstrokes bilaterally   LUNGS: no acc muscle use,  Min barrel  contour chest wall with bilateral  slightly decreased bs s audible wheeze and  without cough on insp or exp maneuvers and min  Hyperresonant  to  percussion bilaterally     CV:  RRR  no s3 or murmur or increase in P2, and no edema   ABD: obese  soft and nontender with  pos end  insp Hoover's  in the supine position. No bruits or organomegaly appreciated, bowel sounds nl  MS:   Nl gait/  ext warm without deformities, calf tenderness, cyanosis or clubbing No obvious joint restrictions   SKIN: warm and dry without lesions    NEURO:  alert, approp, nl sensorium with  no motor or cerebellar deficits apparent.        CT chest July 09 2019 neg for as dz     Assessment   No problem-specific Assessment & Plan notes found for this encounter.     Christinia Gully, MD 11/27/2019

## 2019-12-17 ENCOUNTER — Other Ambulatory Visit: Payer: Self-pay

## 2019-12-17 ENCOUNTER — Ambulatory Visit (INDEPENDENT_AMBULATORY_CARE_PROVIDER_SITE_OTHER): Payer: Medicare Other | Admitting: Internal Medicine

## 2019-12-17 ENCOUNTER — Encounter (INDEPENDENT_AMBULATORY_CARE_PROVIDER_SITE_OTHER): Payer: Self-pay | Admitting: Internal Medicine

## 2019-12-17 DIAGNOSIS — K219 Gastro-esophageal reflux disease without esophagitis: Secondary | ICD-10-CM

## 2019-12-17 DIAGNOSIS — K589 Irritable bowel syndrome without diarrhea: Secondary | ICD-10-CM | POA: Insufficient documentation

## 2019-12-17 MED ORDER — HYOSCYAMINE SULFATE SL 0.125 MG SL SUBL: 1.0000 | SUBLINGUAL_TABLET | Freq: Three times a day (TID) | SUBLINGUAL | 1 refills | Status: DC | PRN

## 2019-12-17 NOTE — Patient Instructions (Signed)
Please call with progress report in 8 weeks or earlier if left lower quadrant abdominal pain worsens.

## 2019-12-17 NOTE — Progress Notes (Signed)
Presenting complaint;  Follow-up for epigastric and left lower quadrant abdominal pain. History of GERD.  Database and subjective:  Patient is 74 year old Caucasian female who is here for scheduled visit.  She was last seen 1 year ago.  Patient has a history of GERD choledocholithiasis status post ERCP in February and June 2019. Patient states that she and her husband both had Covid infection back in April 2021.  She was admitted to [facility] for 5 days.  Since then she has noted 2-3 bowel movements per day.  She is also been having LLQ abdominal pain but it does not last too long.  She remains with epigastric pain which seem to ease with famotidine.  She has heartburn no more than twice a week.  She denies nausea vomiting dysphagia melena or frank rectal bleeding.  Every now and then she may notice blood on the tissue. She says epigastric pain occurs no more than once a week.  This pain occurs at different times and not necessarily after meals are in between meals. She has only lost 2 pounds since her last visit 1 year ago.  Current Medications: Outpatient Encounter Medications as of 12/17/2019  Medication Sig  . acetaminophen (TYLENOL) 500 MG tablet Take 1,000 mg by mouth daily as needed for moderate pain.   Marland Kitchen albuterol (VENTOLIN HFA) 108 (90 Base) MCG/ACT inhaler SMARTSIG:1 Puff(s) Via Inhaler Every 4 Hours PRN  . Cholecalciferol 5000 units capsule Take 5,000 Units by mouth daily.   Marland Kitchen conjugated estrogens (PREMARIN) vaginal cream Place 1 Applicatorful vaginally daily.  . Cyanocobalamin (VITAMIN B-12 IJ) Inject 1 Dose as directed every 30 (thirty) days.   . diazepam (VALIUM) 10 MG tablet Take 2.5 mg by mouth at bedtime as needed for sleep.  . famotidine (PEPCID) 20 MG tablet TAKE 1 TABLET (20 MG TOTAL) BY MOUTH 2 (TWO) TIMES DAILY AS NEEDED FOR HEARTBURN OR INDIGESTION.  Marland Kitchen latanoprost (XALATAN) 0.005 % ophthalmic solution Place 1 drop into both eyes at bedtime.   . metoprolol tartrate  (LOPRESSOR) 50 MG tablet TAKE 1 TABLET BY MOUTH TWICE A DAY  . oxyCODONE (OXY IR/ROXICODONE) 5 MG immediate release tablet Take 5 mg by mouth daily as needed.  Marland Kitchen spironolactone (ALDACTONE) 25 MG tablet Take 25 mg by mouth daily.   Marland Kitchen triamcinolone cream (KENALOG) 0.1 % APPLY TO AFFECTED AREA UP TO TWICE A DAY AS NEEDED. (NOT FACE,GROIN, OR UNDERARMS)  . allopurinol (ZYLOPRIM) 300 MG tablet Take 300 mg by mouth daily.  (Patient not taking: Reported on 11/27/2019)  . [DISCONTINUED] Vitamin D, Ergocalciferol, (DRISDOL) 1.25 MG (50000 UNIT) CAPS capsule Take 50,000 Units by mouth every 7 (seven) days. (Patient not taking: Reported on 12/17/2019)   No facility-administered encounter medications on file as of 12/17/2019.     Objective: Blood pressure (!) 143/75, pulse 86, temperature 99.2 F (37.3 C), temperature source Oral, height 4\' 8"  (1.422 m), weight 145 lb 8 oz (66 kg). Patient is alert and in no acute distress. Patient is wearing facial mask. Conjunctiva is pink. Sclera is nonicteric Oropharyngeal mucosa is normal. No neck masses or thyromegaly noted. Cardiac exam with regular rhythm normal S1 and S2. No murmur or gallop noted. Lungs are clear to auscultation. Abdomen is full.  She has Normodyne scar.  Bowel sounds are normal.  On palpation abdomen is soft.  She has mild tenderness at LLQ.  There is no guarding.  No organomegaly or masses. No LE edema or clubbing noted.  Labs/studies Results: No recent lab available for  review.   Assessment:  #1.  LLQ abdominal pain.  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  An abdominal examination is relatively benign.  If pain worsens and/or if she does not respond to antispasmodic will consider abdominal pelvic CT.  #2.  GERD.  She is on low-dose famotidine.  She should consider taking it twice daily.  #3.  Epigastric pain.  This pain may be part and parcel of GERD or she could have dyspepsia.  She has a history of  choledocholithiasis.  Her transaminases in the past have been normal.  Will request recent blood work from [employer].    Plan:  Patient advised to take famotidine 20 mg p.o. twice daily for few weeks and see if it would help alleviate epigastric pain.  It may help decrease frequency of heartburn as well. Levsin sublingual 1 tablet 3 times daily as needed. Will request copy of blood work from [employer]. Patient will call with progress report in few weeks. Office visit in 6 months.

## 2020-01-12 ENCOUNTER — Other Ambulatory Visit (INDEPENDENT_AMBULATORY_CARE_PROVIDER_SITE_OTHER): Payer: Self-pay | Admitting: Internal Medicine

## 2020-02-05 ENCOUNTER — Ambulatory Visit (INDEPENDENT_AMBULATORY_CARE_PROVIDER_SITE_OTHER): Payer: Medicare Other | Admitting: Cardiology

## 2020-02-05 ENCOUNTER — Encounter: Payer: Self-pay | Admitting: Cardiology

## 2020-02-05 VITALS — BP 122/84 | HR 78 | Ht <= 58 in | Wt 144.0 lb

## 2020-02-05 DIAGNOSIS — R000 Tachycardia, unspecified: Secondary | ICD-10-CM | POA: Diagnosis not present

## 2020-02-05 DIAGNOSIS — I10 Essential (primary) hypertension: Secondary | ICD-10-CM

## 2020-02-05 NOTE — Patient Instructions (Addendum)
Medication Instructions:   Your physician recommends that you continue on your current medications as directed. Please refer to the Current Medication list given to you today.  Labwork:  None  Testing/Procedures:  None  Follow-Up:  Your physician recommends that you schedule a follow-up appointment in: 1 year.  Any Other Special Instructions Will Be Listed Below (If Applicable).  If you need a refill on your cardiac medications before your next appointment, please call your pharmacy. 

## 2020-02-05 NOTE — Progress Notes (Signed)
Cardiology Office Note  Date: 02/05/2020   ID: Kathryn, Meyers 07-04-1945, MRN [id number]  PCP:  Kathryn Burly, MD  Cardiologist:  Rozann Lesches, MD Electrophysiologist:  None   Chief Complaint  Patient presents with  . Cardiac follow-up    History of Present Illness: Kathryn Meyers is a 75 y.o. female last assessed via telehealth encounter in October 2020.  She is here today with her husband for a follow-up visit.  From a cardiac perspective, she does not report any palpitations, heart rate is well controlled today on current dose of Lopressor.  I reviewed her ECG which shows sinus rhythm with short PR interval and diffuse nonspecific ST-T wave abnormalities.  She states that she had COVID-19 last April, predominantly GI symptoms and possibly colitis per her report.  She states that she took ivermectin.  Was also hospitalized for about a week.  Past Medical History:  Diagnosis Date  . Acid reflux   . Anxiety   . Arthritis   . Colitis   . Glaucoma   . Gout   . History of skin cancer   . Inappropriate sinus tachycardia   . 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 Shingles     Past Surgical History:  Procedure Laterality Date  . BILIARY STENT PLACEMENT N/A 03/10/2017   Procedure: BILIARY STENT PLACEMENT;  Surgeon: Rogene Houston, MD;  [facility];  Service: Gastroenterology;  Laterality: N/A;  . BIOPSY  06/30/2017   Procedure: BIOPSY AMPULA;  Surgeon: Rogene Houston, MD;  [facility];  Service: Gastroenterology;;  . CATARACT EXTRACTION Bilateral   . CESAREAN SECTION     x 4  . CHOLECYSTECTOMY  1982   cholecystitis  . COLONOSCOPY N/A 06/19/2014   Procedure: COLONOSCOPY;  Surgeon: Rogene Houston, MD;  [facility];  Service: Endoscopy;  Laterality: N/A;  730 - moved to 5/26 @ 12:00   . COLONOSCOPY N/A 09/26/2014   Procedure: COLONOSCOPY;  Surgeon: Rogene Houston, MD;  [facility];  Service: Endoscopy;  Laterality: N/A;  1020 - moved to 11:15 - [name] to notify pt  . COLONOSCOPY N/A 12/27/2017   Procedure: COLONOSCOPY;  Surgeon: Rogene Houston, MD;  [facility];  Service: Endoscopy;  Laterality: N/A;  12:00  . ERCP N/A 03/10/2017   Procedure: ENDOSCOPIC RETROGRADE CHOLANGIOPANCREATOGRAPHY (ERCP) With sphincterotomy and stone extraction.;  Surgeon: Rogene Houston, MD;  [facility];  Service: Gastroenterology;  Laterality: N/A;  . ERCP N/A 06/30/2017   Procedure: ENDOSCOPIC RETROGRADE CHOLANGIOPANCREATOGRAPHY (ERCP);  Surgeon: Rogene Houston, MD;  [facility];  Service: Endoscopy;  Laterality: N/A;  pt knows to arrive at 10:20  . ESOPHAGOGASTRODUODENOSCOPY ENDOSCOPY  06/30/2017   Procedure: ESOPHAGOGASTRODUODENOSCOPY ENDOSCOPY;  Surgeon: Rogene Houston, MD;  [facility];  Service: Endoscopy;;  . GASTROINTESTINAL STENT REMOVAL N/A 06/30/2017   Procedure: GASTROINTESTINAL STENT REMOVAL;  Surgeon: Rogene Houston, MD;  [facility];  Service: Endoscopy;  Laterality: N/A;  . Growth in ear removed     1994  . KNEE SURGERY Right   . POLYPECTOMY  12/27/2017   Procedure: POLYPECTOMY;  Surgeon: Rogene Houston, MD;  [facility];  Service: Endoscopy;;  polyps at splenic flexure x3, cecal polyp, transverse polyps x3  .  REMOVAL OF STONES  06/30/2017   Procedure: REMOVAL OF STONES FRAGMENTS ;  Surgeon: Rogene Houston, MD;  [facility];  Service: Gastroenterology;;  . rt knee replacement     2017  . SPHINCTEROTOMY N/A 03/10/2017   Procedure: SPHINCTEROTOMY;  Surgeon: Rogene Houston, MD;  [facility];  Service: Gastroenterology;  Laterality: N/A;  . stent eye Left   . STONE EXTRACTION WITH BASKET N/A 03/10/2017   Procedure: STONE EXTRACTION WITH BASKET;  Surgeon: Rogene Houston, MD;   [facility];  Service: Gastroenterology;  Laterality: N/A;  . TONSILLECTOMY     age 86    Current Outpatient Medications  Medication Sig Dispense Refill  . acetaminophen (TYLENOL) 500 MG tablet Take 1,000 mg by mouth daily as needed for moderate pain.     Marland Kitchen albuterol (VENTOLIN HFA) 108 (90 Base) MCG/ACT inhaler SMARTSIG:1 Puff(s) Via Inhaler Every 4 Hours PRN    . Cholecalciferol 5000 units capsule Take 5,000 Units by mouth daily.     Marland Kitchen conjugated estrogens (PREMARIN) vaginal cream Place 1 Applicatorful vaginally daily. 42.5 g 12  . Cyanocobalamin (VITAMIN B-12 IJ) Inject 1 Dose as directed every 30 (thirty) days.     . diazepam (VALIUM) 10 MG tablet Take 2.5 mg by mouth at bedtime as needed for sleep.    . famotidine (PEPCID) 20 MG tablet TAKE 1 TABLET (20 MG TOTAL) BY MOUTH 2 (TWO) TIMES DAILY AS NEEDED FOR HEARTBURN OR INDIGESTION. 180 tablet 1  . Hyoscyamine Sulfate SL (LEVSIN/SL) 0.125 MG SUBL Place 1 tablet under the tongue 3 (three) times daily as needed. 60 tablet 1  . latanoprost (XALATAN) 0.005 % ophthalmic solution Place 1 drop into both eyes at bedtime.     . metoprolol tartrate (LOPRESSOR) 50 MG tablet TAKE 1 TABLET BY MOUTH TWICE A DAY 180 tablet 2  . oxyCODONE (OXY IR/ROXICODONE) 5 MG immediate release tablet Take 5 mg by mouth daily as needed.    Marland Kitchen spironolactone (ALDACTONE) 25 MG tablet Take 25 mg by mouth daily.      No current facility-administered medications for this visit.   Allergies:  Nsaids, Cortisone, and Demerol [meperidine]   ROS: No syncope.  Physical Exam: VS:  BP 122/84   Pulse 78   Ht 4\' 8"  (1.422 m)   Wt 144 lb (65.3 kg)   SpO2 98%   BMI 32.28 kg/m , BMI Body mass index is 32.28 kg/m.  Wt Readings from Last 3 Encounters:  02/05/20 144 lb (65.3 kg)  12/17/19 145 lb 8 oz (66 kg)  11/27/19 145 lb 3.2 oz (65.9 kg)    General: Patient appears comfortable at rest. HEENT: Conjunctiva and lids normal, wearing a mask. Neck: Supple, no  elevated JVP or carotid bruits, no thyromegaly. Lungs: Clear to auscultation, nonlabored breathing at rest. Cardiac: Regular rate and rhythm, no S3 or significant systolic murmur, no pericardial rub. Extremities: No pitting edema.  ECG:  An ECG dated 11/14/2017 was personally reviewed today and demonstrated:  Sinus rhythm with short PR interval and diffuse nonspecific ST segment changes.  Recent Labwork:  July 2021: Potassium 3.3, BUN 7, creatinine 0.8, AST 21, ALT 15, hemoglobin 15.4, platelets 416  Other Studies Reviewed Today:  Echocardiogram 5/70/2013: Study Conclusions  - Left ventricle: The cavity size was normal. Wall thickness was at the upper limits of normal. Systolic function was normal. The estimated ejection fraction was in the range of 60% to 65%. Wall motion was normal; there  were no regional wall motion abnormalities. Left ventricular diastolic function parameters were normal. - Mitral valve: Trivial regurgitation. - Tricuspid valve: Mild regurgitation directed eccentrically. Peak RV-RA gradient: 64mm Hg (S). - Pulmonary arteries: Systolic pressure was within the normal range. - Pericardium, extracardiac: There was no pericardial effusion.  Assessment and Plan:  1. History of inappropriate sinus tachycardia.  Heart rate is in the 70s today on Lopressor, continue current dose and observation.  2. Essential hypertension, on Aldactone, blood pressure is well controlled today.  Keep follow-up with PCP.  Medication Adjustments/Labs and Tests Ordered: Current medicines are reviewed at length with the patient today.  Concerns regarding medicines are outlined above.   Tests Ordered: Orders Placed This Encounter  Procedures  . EKG 12-Lead    Medication Changes: No orders of the defined types were placed in this encounter.   Disposition:  Follow up 1 year in the [place] office.  Signed, Satira Sark, MD, [facility] 02/05/2020 4:11 PM    [place] at [address], [place], [place] [address] Phone: [phone]; Fax: [phone]

## 2020-02-17 ENCOUNTER — Telehealth (INDEPENDENT_AMBULATORY_CARE_PROVIDER_SITE_OTHER): Payer: Self-pay | Admitting: *Deleted

## 2020-02-17 NOTE — Telephone Encounter (Signed)
Patient reports,( she left a voicemail)  that Dr.Rehman had ask her to let him know if her stomach started hurting again. She states that her stomach is hurting and she saw her PCP who prescribed to her Cipro and Flagyl. Patient states that this has made her real , real sick. She cannot take it.  She would like to know what Dr.Rehman would recommend for her as she does not know what to do.

## 2020-02-17 NOTE — Telephone Encounter (Signed)
Per Dr.Rehman the patient will need to have the following labs. Hepatic , Amylase and Lipase. Patient will be called and made aware 02/18/2020.

## 2020-02-18 ENCOUNTER — Other Ambulatory Visit (INDEPENDENT_AMBULATORY_CARE_PROVIDER_SITE_OTHER): Payer: Self-pay | Admitting: *Deleted

## 2020-02-18 DIAGNOSIS — R1011 Right upper quadrant pain: Secondary | ICD-10-CM

## 2020-02-18 DIAGNOSIS — R1013 Epigastric pain: Secondary | ICD-10-CM

## 2020-02-18 DIAGNOSIS — K589 Irritable bowel syndrome without diarrhea: Secondary | ICD-10-CM

## 2020-02-19 LAB — HEPATIC FUNCTION PANEL
AG Ratio: 1.6 (calc) (ref 1.0–2.5)
ALT: 27 U/L (ref 6–29)
AST: 35 U/L (ref 10–35)
Albumin: 4.2 g/dL (ref 3.6–5.1)
Alkaline phosphatase (APISO): 61 U/L (ref 37–153)
Bilirubin, Direct: 0.2 mg/dL (ref 0.0–0.2)
Globulin: 2.7 g/dL (calc) (ref 1.9–3.7)
Indirect Bilirubin: 0.4 mg/dL (calc) (ref 0.2–1.2)
Total Bilirubin: 0.6 mg/dL (ref 0.2–1.2)
Total Protein: 6.9 g/dL (ref 6.1–8.1)

## 2020-02-19 LAB — LIPASE: Lipase: 32 U/L (ref 7–60)

## 2020-02-19 LAB — AMYLASE: Amylase: 62 U/L (ref 21–101)

## 2020-02-20 ENCOUNTER — Other Ambulatory Visit (INDEPENDENT_AMBULATORY_CARE_PROVIDER_SITE_OTHER): Payer: Self-pay | Admitting: Internal Medicine

## 2020-02-20 MED ORDER — ONDANSETRON HCL 4 MG PO TABS: 4.0000 mg | ORAL_TABLET | Freq: Two times a day (BID) | ORAL | 0 refills | Status: AC | PRN

## 2020-04-22 ENCOUNTER — Ambulatory Visit (INDEPENDENT_AMBULATORY_CARE_PROVIDER_SITE_OTHER): Payer: Medicare Other | Admitting: Urology

## 2020-04-22 ENCOUNTER — Other Ambulatory Visit: Payer: Self-pay

## 2020-04-22 ENCOUNTER — Encounter: Payer: Self-pay | Admitting: Urology

## 2020-04-22 VITALS — BP 106/66 | HR 82 | Temp 98.3°F | Ht <= 58 in | Wt 138.0 lb

## 2020-04-22 DIAGNOSIS — R310 Gross hematuria: Secondary | ICD-10-CM | POA: Diagnosis not present

## 2020-04-22 DIAGNOSIS — N362 Urethral caruncle: Secondary | ICD-10-CM

## 2020-04-22 LAB — URINALYSIS, ROUTINE W REFLEX MICROSCOPIC
Bilirubin, UA: NEGATIVE
Glucose, UA: NEGATIVE
Ketones, UA: NEGATIVE
Leukocytes,UA: NEGATIVE
Nitrite, UA: NEGATIVE
Protein,UA: NEGATIVE
Specific Gravity, UA: 1.02 (ref 1.005–1.030)
Urobilinogen, Ur: 0.2 mg/dL (ref 0.2–1.0)
pH, UA: 5.5 (ref 5.0–7.5)

## 2020-04-22 NOTE — Patient Instructions (Signed)

## 2020-04-22 NOTE — Progress Notes (Signed)
04/22/2020 3:12 PM   Kathryn Meyers September 20, 1945 [id number]  Referring provider: Neale Burly, MD [address] [place],  [place] [address]  followup gross hematuria  HPI: [name] is a 75yo here for followup for gross hematuria and urethral caruncle. No gross hematuria since last visit. She finished using premarin last week. No significant LUTS. No complaints today   PMH: Past Medical History:  Diagnosis Date  . Acid reflux   . Anxiety   . Arthritis   . Colitis   . Glaucoma   . Gout   . History of skin cancer   . Inappropriate sinus tachycardia   . 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 Shingles     Surgical History: Past Surgical History:  Procedure Laterality Date  . BILIARY STENT PLACEMENT N/A 03/10/2017   Procedure: BILIARY STENT PLACEMENT;  Surgeon: Rogene Houston, MD;  [facility];  Service: Gastroenterology;  Laterality: N/A;  . BIOPSY  06/30/2017   Procedure: BIOPSY AMPULA;  Surgeon: Rogene Houston, MD;  [facility];  Service: Gastroenterology;;  . CATARACT EXTRACTION Bilateral   . CESAREAN SECTION     x 4  . CHOLECYSTECTOMY  1982   cholecystitis  . COLONOSCOPY N/A 06/19/2014   Procedure: COLONOSCOPY;  Surgeon: Rogene Houston, MD;  [facility];  Service: Endoscopy;  Laterality: N/A;  730 - moved to 5/26 @ 12:00  . COLONOSCOPY N/A 09/26/2014   Procedure: COLONOSCOPY;  Surgeon: Rogene Houston, MD;  [facility];  Service: Endoscopy;  Laterality: N/A;  1020 - moved to 11:15 - [name] to notify pt  . COLONOSCOPY N/A 12/27/2017   Procedure: COLONOSCOPY;  Surgeon: Rogene Houston, MD;  [facility];  Service: Endoscopy;  Laterality: N/A;  12:00  . ERCP N/A 03/10/2017   Procedure: ENDOSCOPIC RETROGRADE CHOLANGIOPANCREATOGRAPHY (ERCP) With sphincterotomy and  stone extraction.;  Surgeon: Rogene Houston, MD;  [facility];  Service: Gastroenterology;  Laterality: N/A;  . ERCP N/A 06/30/2017   Procedure: ENDOSCOPIC RETROGRADE CHOLANGIOPANCREATOGRAPHY (ERCP);  Surgeon: Rogene Houston, MD;  [facility];  Service: Endoscopy;  Laterality: N/A;  pt knows to arrive at 10:20  . ESOPHAGOGASTRODUODENOSCOPY ENDOSCOPY  06/30/2017   Procedure: ESOPHAGOGASTRODUODENOSCOPY ENDOSCOPY;  Surgeon: Rogene Houston, MD;  [facility];  Service: Endoscopy;;  . GASTROINTESTINAL STENT REMOVAL N/A 06/30/2017   Procedure: GASTROINTESTINAL STENT REMOVAL;  Surgeon: Rogene Houston, MD;  [facility];  Service: Endoscopy;  Laterality: N/A;  . Growth in ear removed     1994  . KNEE SURGERY Right   . POLYPECTOMY  12/27/2017   Procedure: POLYPECTOMY;  Surgeon: Rogene Houston, MD;  [facility];  Service: Endoscopy;;  polyps at splenic flexure x3, cecal polyp, transverse polyps x3  . REMOVAL OF STONES  06/30/2017   Procedure: REMOVAL OF STONES FRAGMENTS ;  Surgeon: Rogene Houston, MD;  [facility];  Service: Gastroenterology;;  . rt knee replacement     2017  . SPHINCTEROTOMY N/A 03/10/2017   Procedure: SPHINCTEROTOMY;  Surgeon: Rogene Houston, MD;  [facility];  Service: Gastroenterology;  Laterality: N/A;  . stent eye Left   . STONE EXTRACTION WITH BASKET N/A 03/10/2017   Procedure: STONE EXTRACTION WITH BASKET;  Surgeon:  Rogene Houston, MD;  [facility];  Service: Gastroenterology;  Laterality: N/A;  . TONSILLECTOMY     age 67    Home Medications:  Allergies as of 04/22/2020      Reactions   Nsaids Other (See Comments)   Rectal bleeding.   Cortisone Other (See Comments)   Dizzy/flush/rapid heart beat.   Demerol [meperidine] Nausea And Vomiting      Medication List       Accurate as of April 22, 2020  3:12 PM. If you have any questions, ask your nurse or doctor.         acetaminophen 500 MG tablet Commonly known as: TYLENOL Take 1,000 mg by mouth daily as needed for moderate pain.   albuterol 108 (90 Base) MCG/ACT inhaler Commonly known as: VENTOLIN HFA SMARTSIG:1 Puff(s) Via Inhaler Every 4 Hours PRN   Anoro Ellipta 62.5-25 MCG/INH Aepb Generic drug: umeclidinium-vilanterol Inhale 1 puff into the lungs daily.   Cholecalciferol 125 MCG (5000 UT) capsule Take 5,000 Units by mouth daily.   diazepam 10 MG tablet Commonly known as: VALIUM Take 2.5 mg by mouth at bedtime as needed for sleep.   famotidine 20 MG tablet Commonly known as: PEPCID TAKE 1 TABLET (20 MG TOTAL) BY MOUTH 2 (TWO) TIMES DAILY AS NEEDED FOR HEARTBURN OR INDIGESTION.   hyoscyamine 0.125 MG SL tablet Commonly known as: LEVSIN SL PLACE 1 TABLET UNDER THE TONGUE 3 (THREE) TIMES DAILY AS NEEDED.   latanoprost 0.005 % ophthalmic solution Commonly known as: XALATAN Place 1 drop into both eyes at bedtime.   metoprolol tartrate 50 MG tablet Commonly known as: LOPRESSOR TAKE 1 TABLET BY MOUTH TWICE A DAY   ondansetron 4 MG tablet Commonly known as: ZOFRAN Take 1 tablet (4 mg total) by mouth 2 (two) times daily as needed for nausea or vomiting.   oxyCODONE 5 MG immediate release tablet Commonly known as: Oxy IR/ROXICODONE Take 5 mg by mouth daily as needed.   Premarin vaginal cream Generic drug: conjugated estrogens Place 1 Applicatorful vaginally daily.   spironolactone 25 MG tablet Commonly known as: ALDACTONE Take 25 mg by mouth daily.   VITAMIN B-12 IJ Inject 1 Dose as directed every 30 (thirty) days.       Allergies:  Allergies  Allergen Reactions  . Nsaids Other (See Comments)    Rectal bleeding.  . Cortisone Other (See Comments)    Dizzy/flush/rapid heart beat.  . Demerol [Meperidine] Nausea And Vomiting    Family History: Family History  Problem Relation Age of Onset  . Dementia Mother   . Alcoholism Father     Social History:  reports that  she has been smoking cigarettes. She started smoking about 43 years ago. She has a 51.00 pack-year smoking history. She has never used smokeless tobacco. She reports that she does not drink alcohol and does not use drugs.  ROS: All other review of systems were reviewed and are negative except what is noted above in HPI  Physical Exam: BP 106/66   Pulse 82   Temp 98.3 F (36.8 C)   Ht 4\' 8"  (1.422 m)   Wt 138 lb (62.6 kg)   BMI 30.94 kg/m   Constitutional:  Alert and oriented, No acute distress. HEENT: [place] AT, moist mucus membranes.  Trachea midline, no masses. Cardiovascular: No clubbing, cyanosis, or edema. Respiratory: Normal respiratory effort, no increased work of breathing. GI: Abdomen is soft, nontender, nondistended, no abdominal masses GU: No CVA tenderness.  Lymph:  No cervical or inguinal lymphadenopathy. Skin: No rashes, bruises or suspicious lesions. Neurologic: Grossly intact, no focal deficits, moving all 4 extremities. Psychiatric: Normal mood and affect.  Laboratory Data: Lab Results  Component Value Date   WBC 13.5 (H) 10/24/2018   HGB 15.1 10/24/2018   HCT 45.7 (H) 10/24/2018   MCV 88.7 10/24/2018   PLT 431 (H) 10/24/2018    Lab Results  Component Value Date   CREATININE 0.70 10/24/2018    No results found for: PSA  No results found for: TESTOSTERONE  No results found for: HGBA1C  Urinalysis    Component Value Date/Time   COLORURINE YELLOW 03/08/2017 1027   APPEARANCEUR Clear 04/22/2020 1453   LABSPEC 1.014 03/08/2017 1027   PHURINE 5.0 03/08/2017 1027   GLUCOSEU Negative 04/22/2020 1453   HGBUR NEGATIVE 03/08/2017 1027   BILIRUBINUR Negative 04/22/2020 1453   KETONESUR NEGATIVE 03/08/2017 1027   PROTEINUR Negative 04/22/2020 1453   PROTEINUR 30 (A) 03/08/2017 1027   UROBILINOGEN negative (A) 07/24/2019 1446   UROBILINOGEN 0.2 07/22/2010 1314   NITRITE Negative 04/22/2020 1453   NITRITE NEGATIVE 03/08/2017 1027   LEUKOCYTESUR Negative  04/22/2020 1453    Lab Results  Component Value Date   LABMICR Comment 04/22/2020   WBCUA 0-5 10/23/2019   LABEPIT >10 (A) 10/23/2019   BACTERIA Few (A) 10/23/2019    Pertinent Imaging:  No results found for this or any previous visit.  No results found for this or any previous visit.  No results found for this or any previous visit.  No results found for this or any previous visit.  No results found for this or any previous visit.  No results found for this or any previous visit.  No results found for this or any previous visit.  No results found for this or any previous visit.   Assessment & Plan:    1. Gross hematuria -resolved with treating caruncle - Urinalysis, Routine w reflex microscopic  2. Urethral caruncle -resolved. RTC 1 year.    No follow-ups on file.  Nicolette Bang, MD  [facility] [place]

## 2020-04-22 NOTE — Progress Notes (Signed)
Urological Symptom Review  Patient is experiencing the following symptoms: Get up at night 1x   Review of Systems  Gastrointestinal (upper)  : Negative for upper GI symptoms  Gastrointestinal (lower) : Diarrhea  Constitutional : Fatigue  Skin: Negative for skin symptoms  Eyes: Negative for eye symptoms  Ear/Nose/Throat : Negative for Ear/Nose/Throat symptoms  Hematologic/Lymphatic: Negative for Hematologic/Lymphatic symptoms  Cardiovascular : Negative for cardiovascular symptoms  Respiratory : Negative for respiratory symptoms  Endocrine: Negative for endocrine symptoms  Musculoskeletal: Back pain Joint pain  Neurological: Negative for neurological symptoms  Psychologic: Depression Negative for psychiatric symptoms

## 2020-06-16 ENCOUNTER — Ambulatory Visit (INDEPENDENT_AMBULATORY_CARE_PROVIDER_SITE_OTHER): Payer: Medicare Other | Admitting: Internal Medicine

## 2020-06-16 ENCOUNTER — Encounter (INDEPENDENT_AMBULATORY_CARE_PROVIDER_SITE_OTHER): Payer: Self-pay | Admitting: Internal Medicine

## 2020-06-16 ENCOUNTER — Other Ambulatory Visit: Payer: Self-pay

## 2020-06-16 VITALS — BP 134/80 | HR 80 | Temp 97.9°F | Ht <= 58 in | Wt 144.3 lb

## 2020-06-16 DIAGNOSIS — K580 Irritable bowel syndrome with diarrhea: Secondary | ICD-10-CM

## 2020-06-16 DIAGNOSIS — K8050 Calculus of bile duct without cholangitis or cholecystitis without obstruction: Secondary | ICD-10-CM

## 2020-06-16 MED ORDER — DICYCLOMINE HCL 10 MG PO CAPS
10.0000 mg | ORAL_CAPSULE | Freq: Two times a day (BID) | ORAL | 5 refills | Status: DC
Start: 2020-06-16 — End: 2020-12-22

## 2020-06-16 NOTE — Patient Instructions (Signed)
Take dicyclomine 10 mg by mouth 30 minutes before breakfast daily.  Can increase dose to twice daily if you experience no side effects to 10 mg daily.  Take second dose before lunch. Call office with progress report 1 month.   Consider seeing a rheumatologist.  For your musculoskeletal symptoms.

## 2020-06-16 NOTE — Progress Notes (Signed)
Presenting complaint;  Follow-up for upper abdominal pain and diarrhea.  Database hand subjective:  Patient is 75 year old Caucasian female who is here for scheduled visit accompanied by her husband.  She was last seen in November 2021. She has history of choledocholithiasis which was discovered in February 2019.  ERCP revealed ulceration at ampulla.  She had a large bile duct stone.  Sphincterotomy was performed.  Mechanical lithotripsy undertaken and multiple stone fragments were removed.  Duct was not cleared of all the fragments.  Therefore stent was left in place.  She returned in June 2019 when stenting remaining stone fragments were removed.  She has continued to complain of intermittent upper abdominal pain.  She had ultrasound on 10/18/2017 and bile duct only measures 7.5 mm.  There were no stones in the bile duct. She also had abdominal pelvic CT in October 2020 for right upper quadrant abdominal pain but there was no evidence of dilated bile duct or choledocholithiasis.  Pancreas was unremarkable.  Patient remains with upper abdominal pain which occurs with certain foods and associated nausea but no vomiting.  She has noted pain with red meat.  She feels heartburn is well controlled with famotidine.  She has 3 bowel movements per day.  Most of her stools are semiformed.  She denies melena or rectal bleeding.  She did take Levsin but it did not help. She says her appetite is not good.  She has not lost any weight since her last visit.  She also complains of pain in her joints and muscles.  She says she feels better when she drinks water but she does not like to drink water.  She drinks Pepsi and son drops every day.  She also has noted meat seem to trigger joint pains.  She says she tested negative for alpha gal in April 2020.  Her husband states she does not do anything other than working part-time which is 3 days a week.  She is a [employer] manager.  Current Medications: Outpatient Encounter  Medications as of 06/16/2020  Medication Sig  . acetaminophen (TYLENOL) 500 MG tablet Take 1,000 mg by mouth daily as needed for moderate pain.   Marland Kitchen albuterol (VENTOLIN HFA) 108 (90 Base) MCG/ACT inhaler SMARTSIG:1 Puff(s) Via Inhaler Every 4 Hours PRN  . ANORO ELLIPTA 62.5-25 MCG/INH AEPB Inhale 1 puff into the lungs daily.  . Cholecalciferol 5000 units capsule Take 5,000 Units by mouth daily.   . Cyanocobalamin (VITAMIN B-12 IJ) Inject 1 Dose as directed every 30 (thirty) days.   . diazepam (VALIUM) 10 MG tablet Take 2.5 mg by mouth at bedtime as needed for sleep.  . famotidine (PEPCID) 20 MG tablet TAKE 1 TABLET (20 MG TOTAL) BY MOUTH 2 (TWO) TIMES DAILY AS NEEDED FOR HEARTBURN OR INDIGESTION.  . metoprolol tartrate (LOPRESSOR) 50 MG tablet TAKE 1 TABLET BY MOUTH TWICE A DAY  . ondansetron (ZOFRAN) 4 MG tablet Take 1 tablet (4 mg total) by mouth 2 (two) times daily as needed for nausea or vomiting.  Marland Kitchen oxyCODONE (OXY IR/ROXICODONE) 5 MG immediate release tablet Take 5 mg by mouth daily as needed.  Marland Kitchen spironolactone (ALDACTONE) 25 MG tablet Take 25 mg by mouth daily.   . hyoscyamine (LEVSIN SL) 0.125 MG SL tablet PLACE 1 TABLET UNDER THE TONGUE 3 (THREE) TIMES DAILY AS NEEDED. (Patient not taking: Reported on 06/16/2020)  . latanoprost (XALATAN) 0.005 % ophthalmic solution Place 1 drop into both eyes at bedtime.  (Patient not taking: Reported on 06/16/2020)  . [  DISCONTINUED] conjugated estrogens (PREMARIN) vaginal cream Place 1 Applicatorful vaginally daily. (Patient not taking: Reported on 06/16/2020)   No facility-administered encounter medications on file as of 06/16/2020.     Objective: Blood pressure 134/80, pulse 80, temperature 97.9 F (36.6 C), temperature source Oral, height _0  (1.422 m), weight 144 lb 4.8 oz (65.5 kg). Patient is alert and in no acute distress. She is wearing a mask. Conjunctiva is pink. Sclera is nonicteric Oropharyngeal mucosa is normal. No neck masses or  thyromegaly noted. Cardiac exam with regular rhythm normal S1 and S2. No murmur or gallop noted. Lungs are clear to auscultation. Abdomen is full.  She has low midline scar.  Bowel sounds are normal.  On palpation abdomen is soft with mild tenderness left lower quadrant.  No organomegaly or masses. No LE edema or clubbing noted. No tenderness noted to muscles of upper or lower extremity.  Labs/studies Results:  CBC Latest Ref Rng & Units 10/24/2018 06/23/2017 03/11/2017  WBC 3.8 - 10.8 Thousand/uL 13.5(H) 10.9(H) 9.7  Hemoglobin 11.7 - 15.5 g/dL 15.1 15.3(H) 14.2  Hematocrit 35.0 - 45.0 % 45.7(H) 46.2(H) 42.3  Platelets 140 - 400 Thousand/uL 431(H) 375 400    CMP Latest Ref Rng & Units 02/18/2020 10/24/2018 01/31/2018  Glucose 65 - 139 mg/dL - 97 -  BUN 7 - 25 mg/dL - 5(L) -  Creatinine 0.60 - 0.93 mg/dL - 0.70 -  Sodium 135 - 146 mmol/L - 141 -  Potassium 3.5 - 5.3 mmol/L - 3.3(L) -  Chloride 98 - 110 mmol/L - 101 -  CO2 20 - 32 mmol/L - 25 -  Calcium 8.6 - 10.4 mg/dL - 10.0 -  Total Protein 6.1 - 8.1 g/dL 6.9 7.6 6.8  Total Bilirubin 0.2 - 1.2 mg/dL 0.6 0.6 0.4  Alkaline Phos 38 - 126 U/L - - -  AST 10 - 35 U/L 35 21 16  ALT 6 - 29 U/L _1 Hepatic Function Latest Ref Rng & Units 02/18/2020 10/24/2018 01/31/2018  Total Protein 6.1 - 8.1 g/dL 6.9 7.6 6.8  Albumin 3.5 - 5.0 g/dL - - -  AST 10 - 35 U/L 35 21 16  ALT 6 - 29 U/L _2 Alk Phosphatase 38 - 126 U/L - - -  Total Bilirubin 0.2 - 1.2 mg/dL 0.6 0.6 0.4  Bilirubin, Direct 0.0 - 0.2 mg/dL 0.2 - 0.1    Lab Results  Component Value Date   CRP 9.1 (H) 10/24/2018      Assessment:  #1.  History of intermittent epigastric pain.  She has a history of choledocholithiasis.  She underwent ERCP with sphincterotomy mechanical lithotripsy and stone extraction in February 2019 with stenting and he returned and stent and residual stones removed in June 2019.  Since then her imaging studies have been unremarkable and her LFTs  have been normal.  I suspect this pain be due to dyspepsia or IBS.  #2.  Irritable bowel syndrome with diarrhea.  Levsin did not help.  We will try on low-dose dicyclomine.  She must eliminate trigger foods from her diet.  #3.  Arthralgias and myalgias.  The symptoms appear to be having significant impact on quality of her life.  I would recommend that she should be evaluated by a rheumatologist for definite diagnosis and management.  Plan:  Medication list updated.  Hyoscyamine sublingual deleted. Dicyclomine 10 mg before breakfast and lunch daily.  He will also consider starting with 1 dose before  breakfast and see how she does.  If she has any side effects she will stop the medication and call office. Patient also encouraged to eliminate trigger foods from her diet i.e. carbonated drinks and meat. I recommended that she seek expertise of her rheumatologist in order to find out source of her myalgias and arthralgias. Office visit in 6 months.

## 2020-06-30 ENCOUNTER — Other Ambulatory Visit: Payer: Self-pay | Admitting: Cardiology

## 2020-09-09 IMAGING — MG STEREOTACTIC VACUUM ASSIST RIGHT
8 of 11 series · 8 of 19 positions shown · non-contrast
Comparison: Previous exams.

ADDENDUM:
Pathology revealed FOCAL FIBROCYSTIC CHANGES, PSEUDOANGIOMATOUS
STROMAL HYPERPLASIA of RIGHT breast, upper outer. This was found to
be concordant by Dr. Bon Bon Chayang.

Pathology results were discussed with the patient by telephone. The
patient reported doing well after the biopsy with tenderness at the
site. Post biopsy instructions and care were reviewed and questions
were answered. The patient was encouraged to call The [REDACTED]
The patient was instructed to return for annual screening
mammography which she will have at [facility], [HOSP[facility]HOSPITAL].
Pathology results reported by Pui Ching Lonergan, RN on 10/25/2017.
CLINICAL DATA: 72-year-old patient presents for stereotactic biopsy
of a developing asymmetry in the upper outer right breast.
EXAM:
RIGHT BREAST STEREOTACTIC CORE NEEDLE BIOPSY

[R (1 of 6)]
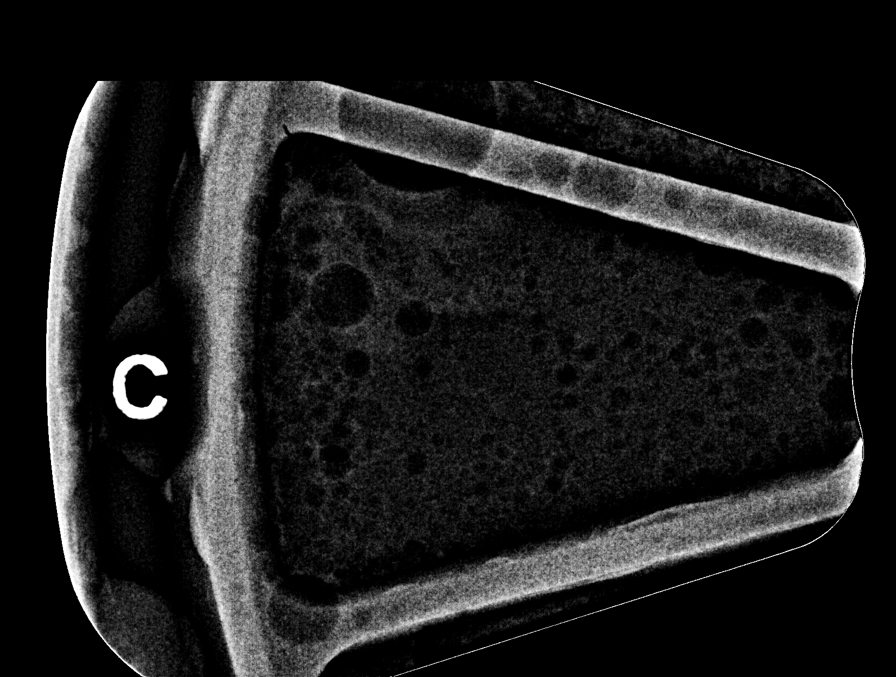

[R (2 of 6)]
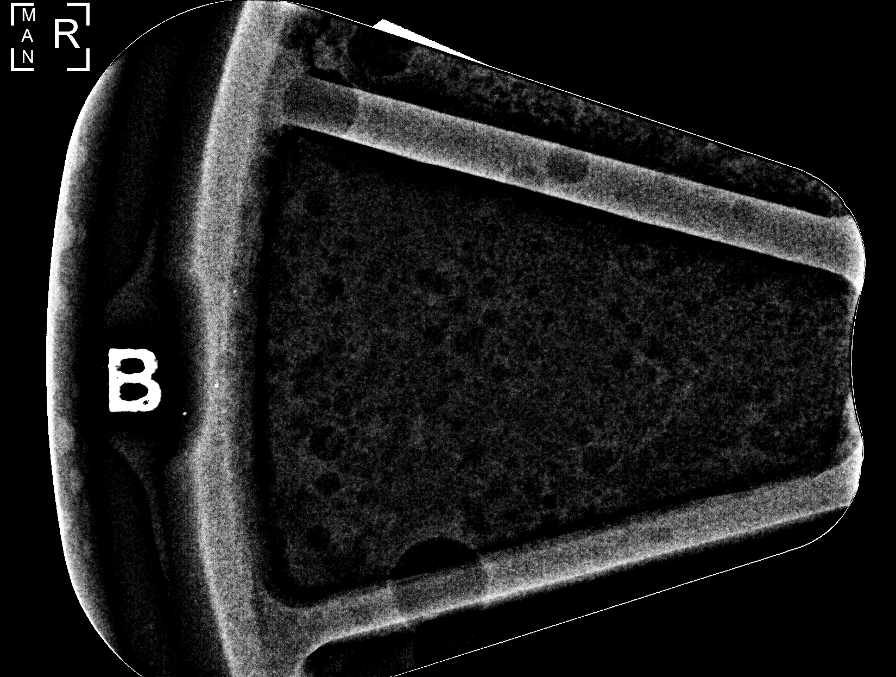

[R (3 of 6)]
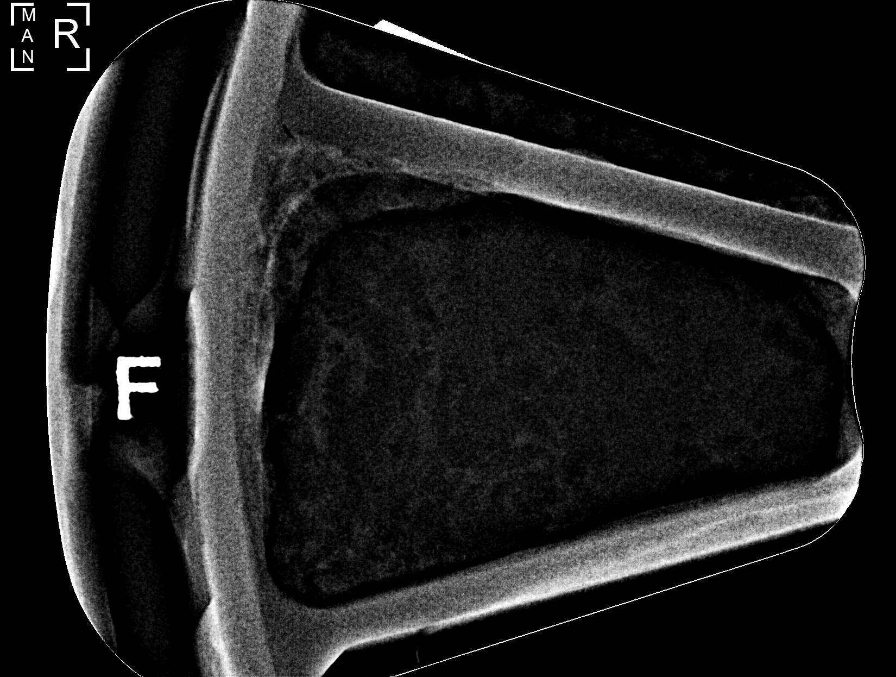

[R (4 of 6)]
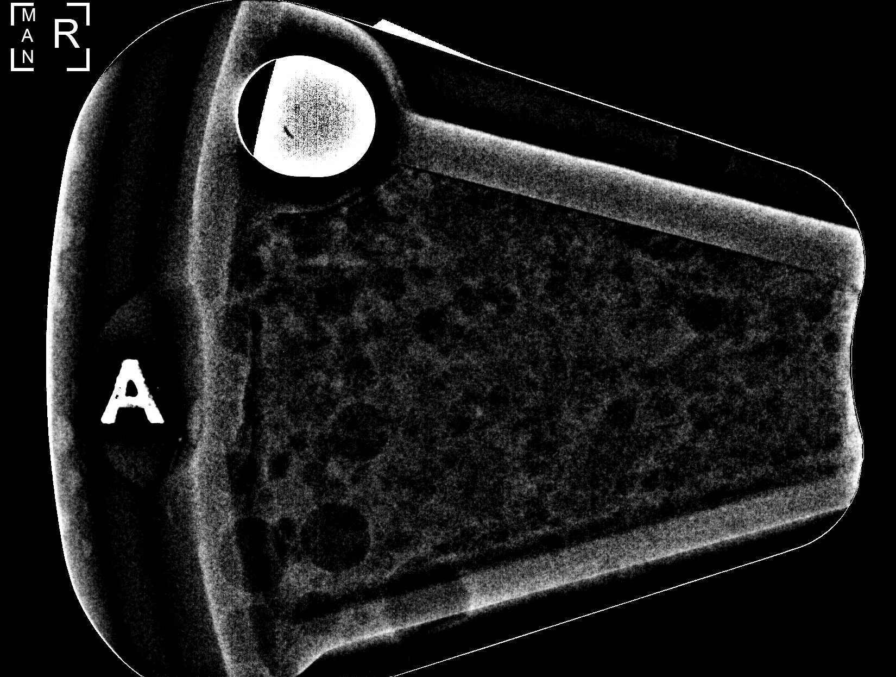

[R (5 of 6)]
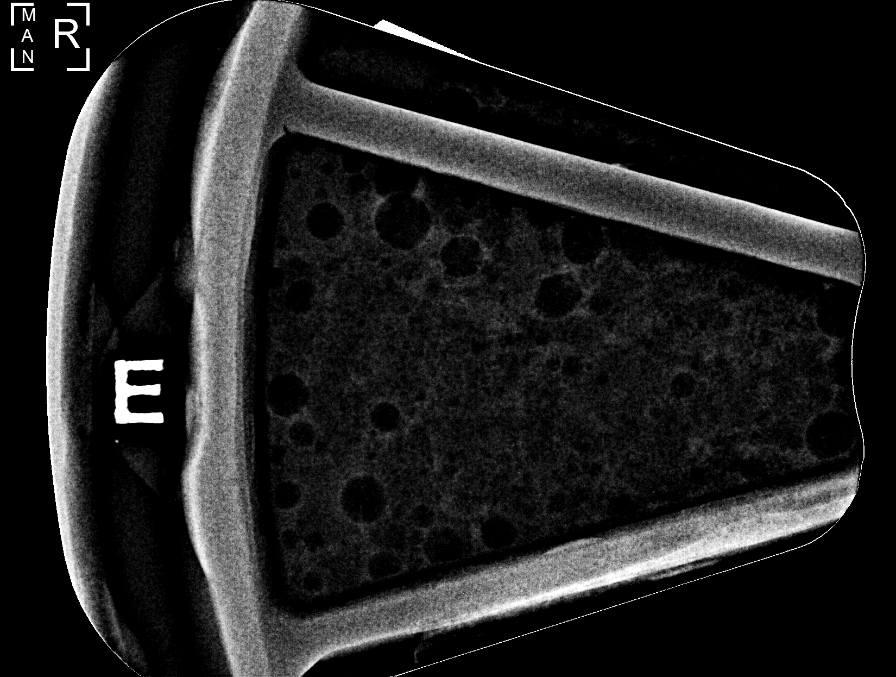

[R (6 of 6)]
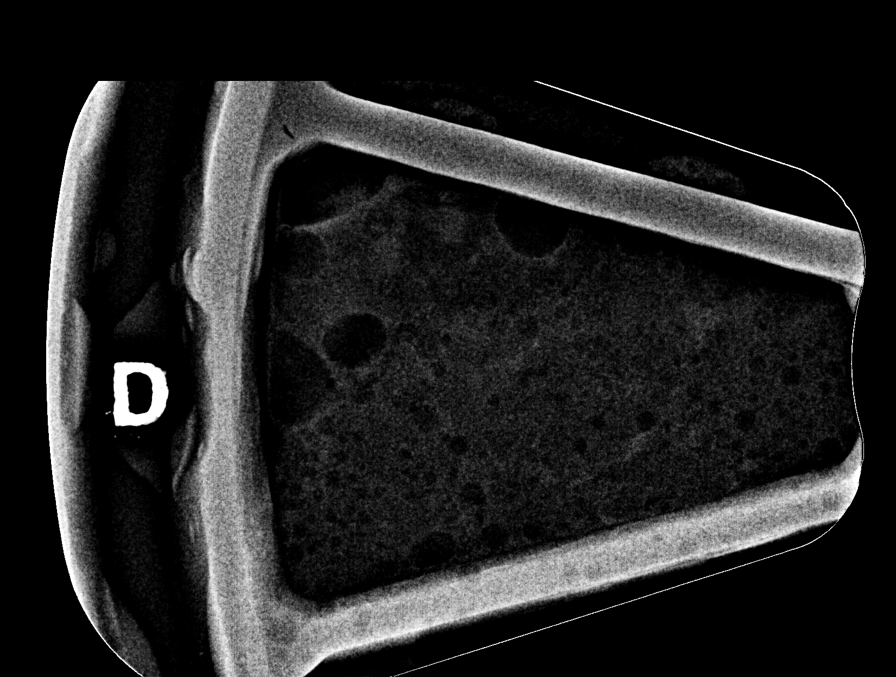

[R LM (1 of 2)]
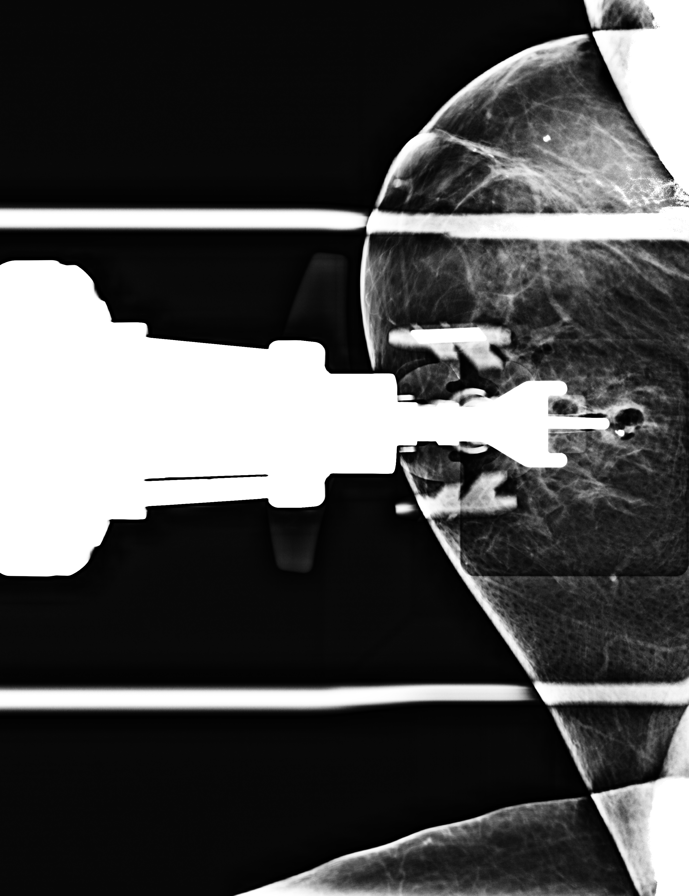

[R LM (2 of 2)]
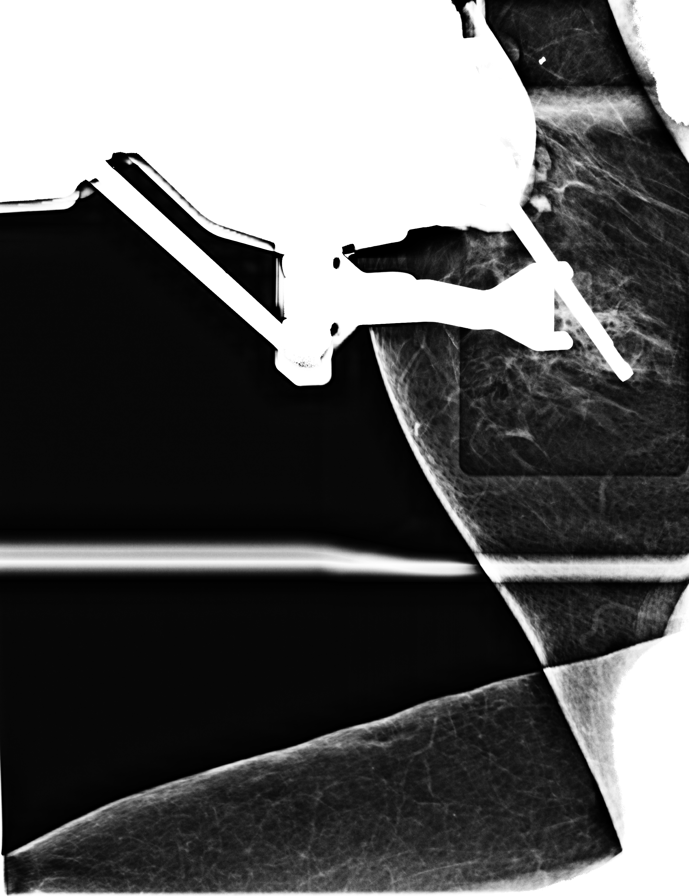

[8 of 19 positions shown; findings below may reference images not displayed]



Using sterile technique and 1% Lidocaine with and without
epinephrine as local anesthetic, under stereotactic guidance, a 9
gauge vacuum assisted device was used to perform core needle biopsy
of an asymmetry in the upper-outer right breast posteriorly using a
lateral approach. Specimen radiograph was performed showing tissue.

Lesion quadrant: Upper outer quadrant

At the conclusion of the procedure, a coil tissue marker clip was
deployed into the biopsy cavity. Follow-up 2-view mammogram was
performed and dictated separately.
IMPRESSION: Stereotactic-guided biopsy of the right breast. No apparent
complications.

## 2020-12-22 ENCOUNTER — Encounter (INDEPENDENT_AMBULATORY_CARE_PROVIDER_SITE_OTHER): Payer: Self-pay | Admitting: Internal Medicine

## 2020-12-22 ENCOUNTER — Other Ambulatory Visit: Payer: Self-pay

## 2020-12-22 ENCOUNTER — Ambulatory Visit (INDEPENDENT_AMBULATORY_CARE_PROVIDER_SITE_OTHER): Payer: Medicare Other | Admitting: Internal Medicine

## 2020-12-22 VITALS — BP 133/86 | HR 91 | Temp 98.2°F | Ht <= 58 in | Wt 145.2 lb

## 2020-12-22 DIAGNOSIS — K219 Gastro-esophageal reflux disease without esophagitis: Secondary | ICD-10-CM

## 2020-12-22 DIAGNOSIS — K580 Irritable bowel syndrome with diarrhea: Secondary | ICD-10-CM

## 2020-12-22 MED ORDER — METAMUCIL SMOOTH TEXTURE 58.6 % PO POWD: 1.0000 | Freq: Every day | ORAL | Status: DC

## 2020-12-22 MED ORDER — PANTOPRAZOLE SODIUM 40 MG PO TBEC
40.0000 mg | DELAYED_RELEASE_TABLET | Freq: Every day | ORAL | 5 refills | Status: DC
Start: 2020-12-22 — End: 2021-06-15

## 2020-12-22 NOTE — Patient Instructions (Signed)
Take famotidine only in the evening for breakthrough symptoms of heartburn on as-needed basis. Take pantoprazole 40 mg by mouth 30 minutes before breakfast daily.  Call with progress report in 2 months. Physician will call with results of blood test when completed.

## 2020-12-22 NOTE — Progress Notes (Signed)
Presenting complaint;  Follow-up for epigastric pain diarrhea and heartburn.  Database hand subjective:  Patient is 75 year old Caucasian female who is here for scheduled visit.  She was last seen on 06/16/2020. She has chronic GERD and diarrhea felt to be due to IBS.  She also has history of choledocholithiasis for which she underwent ERCP with sphincterotomy stenting and stone removal(February 2019 and June 2019). She also has a history of colonic polyps.  Last colonoscopy was in December 2019 with removal of 7 small polyps and these were all tubular adenomas.  She has multiple complaints.  She remains with diarrhea.  On most days she has 2-3 stools.  She does not remember the last time she had normal stool.  She tried dicyclomine but stopped it after 3 days because it caused abdominal pain.  She also complains of intermittent epigastric pain which she describes as twinges.  Pepcid has helped but she does not take it daily.  She has nausea but no vomiting.  She says her appetite is terrible because she does not have taste or sense of smell.  The symptoms started when she had COVID in March 2021.  She is everything taste like cardboard.  However she has not lost any weight since her last visit.  She is using Zofran every couple of weeks.  Last dose was 4 weeks ago.  She drinks 120 ounce some drop daily and Pepsi every day. She continues to complain of intermittent myalgias and arthralgias.  She says she was in the beach recently had had sudden onset of swelling in her wrist and elbow with it resolved after few days.  She states she was tried on Colcrys by Dr. Sherrie Sport but she developed nausea vomiting and diarrhea.  Current Medications: Outpatient Encounter Medications as of 12/22/2020  Medication Sig   acetaminophen (TYLENOL) 500 MG tablet Take 1,000 mg by mouth daily as needed for moderate pain.    albuterol (VENTOLIN HFA) 108 (90 Base) MCG/ACT inhaler SMARTSIG:1 Puff(s) Via Inhaler Every 4 Hours  PRN   ANORO ELLIPTA 62.5-25 MCG/INH AEPB Inhale 1 puff into the lungs daily.   cholecalciferol (VITAMIN D3) 25 MCG (1000 UNIT) tablet Take 1,000 Units by mouth daily.   Cyanocobalamin (VITAMIN B-12 IJ) Inject 1 Dose as directed every 30 (thirty) days.    diazepam (VALIUM) 10 MG tablet Take 2.5 mg by mouth at bedtime as needed for sleep.   famotidine (PEPCID) 20 MG tablet TAKE 1 TABLET (20 MG TOTAL) BY MOUTH 2 (TWO) TIMES DAILY AS NEEDED FOR HEARTBURN OR INDIGESTION.   metoprolol tartrate (LOPRESSOR) 50 MG tablet TAKE 1 TABLET BY MOUTH TWICE A DAY   ondansetron (ZOFRAN) 4 MG tablet Take 1 tablet (4 mg total) by mouth 2 (two) times daily as needed for nausea or vomiting.   oxyCODONE (OXY IR/ROXICODONE) 5 MG immediate release tablet Take 5 mg by mouth daily as needed.   spironolactone (ALDACTONE) 25 MG tablet Take 25 mg by mouth daily.    [DISCONTINUED] Cholecalciferol 5000 units capsule Take 5,000 Units by mouth daily.    [DISCONTINUED] dicyclomine (BENTYL) 10 MG capsule Take 1 capsule (10 mg total) by mouth 2 (two) times daily before a meal. (Patient not taking: Reported on 12/22/2020)   [DISCONTINUED] latanoprost (XALATAN) 0.005 % ophthalmic solution Place 1 drop into both eyes at bedtime.  (Patient not taking: Reported on 06/16/2020)   No facility-administered encounter medications on file as of 12/22/2020.     Objective: Blood pressure 133/86, pulse 91, temperature 98.2  F (36.8 C), temperature source Oral, height _0  (1.422 m), weight 145 lb 3.2 oz (65.9 kg). Patient is alert and in no acute distress. Conjunctiva is pink. Sclera is nonicteric Oropharyngeal mucosa is normal. No neck masses or thyromegaly noted. Cardiac exam with regular rhythm normal S1 and S2. No murmur or gallop noted. Lungs are clear to auscultation. Abdomen is full.  She has long right subcostal and lower midline scars.  Bowel sounds are normal.  On palpation abdomen is soft.  She has mild midepigastric tenderness.   No organomegaly or masses. No LE edema or clubbing noted.  Labs/studies Results:   CBC Latest Ref Rng & Units 10/24/2018 06/23/2017 03/11/2017  WBC 3.8 - 10.8 Thousand/uL 13.5(H) 10.9(H) 9.7  Hemoglobin 11.7 - 15.5 g/dL 15.1 15.3(H) 14.2  Hematocrit 35.0 - 45.0 % 45.7(H) 46.2(H) 42.3  Platelets 140 - 400 Thousand/uL 431(H) 375 400    CMP Latest Ref Rng & Units 02/18/2020 10/24/2018 01/31/2018  Glucose 65 - 139 mg/dL - 97 -  BUN 7 - 25 mg/dL - 5(L) -  Creatinine 0.60 - 0.93 mg/dL - 0.70 -  Sodium 135 - 146 mmol/L - 141 -  Potassium 3.5 - 5.3 mmol/L - 3.3(L) -  Chloride 98 - 110 mmol/L - 101 -  CO2 20 - 32 mmol/L - 25 -  Calcium 8.6 - 10.4 mg/dL - 10.0 -  Total Protein 6.1 - 8.1 g/dL 6.9 7.6 6.8  Total Bilirubin 0.2 - 1.2 mg/dL 0.6 0.6 0.4  Alkaline Phos 38 - 126 U/L - - -  AST 10 - 35 U/L 35 21 16  ALT 6 - 29 U/L _1 Hepatic Function Latest Ref Rng & Units 02/18/2020 10/24/2018 01/31/2018  Total Protein 6.1 - 8.1 g/dL 6.9 7.6 6.8  Albumin 3.5 - 5.0 g/dL - - -  AST 10 - 35 U/L 35 21 16  ALT 6 - 29 U/L _2 Alk Phosphatase 38 - 126 U/L - - -  Total Bilirubin 0.2 - 1.2 mg/dL 0.6 0.6 0.4  Bilirubin, Direct 0.0 - 0.2 mg/dL 0.2 - 0.1       Assessment:  #1.  Chronic diarrhea suspected to be due to IBS.  She did not tolerate dicyclomine.  She may benefit from fiber supplement if she is able to tolerate it.  Given her symptoms need to screen for celiac disease as well.  #2.  GERD.  She is also having intermittent epigastric pain.  She may do better with PPI on schedule rather than as needed HTB.  We will at least try.  #3.  History of arthralgias and myalgias.  Would recommend evaluation by rheumatologist.  #4.  History of colonic adenomas.  She has 7 small adenomas removed on colonoscopy of December 2019.  Next exam would be due in December 2024.   Plan:  Begin pantoprazole 40 mg by mouth 30 minutes before breakfast daily. Use famotidine OTC 20 mg in the evening for  breakthrough heartburn on as-needed basis. Metamucil 2 to 4 g by mouth daily at bedtime. Celiac disease panel. Office visit in 6 months.

## 2021-01-08 LAB — CELIAC DISEASE PANEL
(tTG) Ab, IgA: <1 U/mL
(tTG) Ab, IgG: <1 U/mL
Gliadin IgA: <1 U/mL
Gliadin IgG: <1 U/mL
Immunoglobulin A: 307 mg/dL (ref 70–320)

## 2021-02-08 ENCOUNTER — Other Ambulatory Visit: Payer: Self-pay

## 2021-02-08 ENCOUNTER — Ambulatory Visit (INDEPENDENT_AMBULATORY_CARE_PROVIDER_SITE_OTHER): Payer: Medicare Other | Admitting: Cardiology

## 2021-02-08 ENCOUNTER — Encounter: Payer: Self-pay | Admitting: Cardiology

## 2021-02-08 VITALS — BP 142/80 | HR 87 | Ht <= 58 in | Wt 142.0 lb

## 2021-02-08 DIAGNOSIS — R000 Tachycardia, unspecified: Secondary | ICD-10-CM

## 2021-02-08 DIAGNOSIS — I10 Essential (primary) hypertension: Secondary | ICD-10-CM

## 2021-02-08 DIAGNOSIS — I700 Atherosclerosis of aorta: Secondary | ICD-10-CM

## 2021-02-08 NOTE — Progress Notes (Signed)
Cardiology Office Note  Date: 02/08/2021   ID: Kathryn, Meyers [birth year], MRN [id number]  PCP:  Neale Burly, MD  Cardiologist:  Rozann Lesches, MD Electrophysiologist:  None   Chief Complaint  Patient presents with   Cardiac follow-up    History of Present Illness: Kathryn Meyers is a 76 y.o. female last seen in January 2022.  She is here with her husband for a follow-up visit.  Overall stable from a cardiac perspective and tolerating Lopressor over the years.  She has occasional sense of palpitations, no syncope.  I personally reviewed her ECG which shows sinus rhythm with short PR interval, no delta waves, chronic ST-T wave abnormalities that are stable compared to prior tracing.  She continues to follow with PCP.  She had a lung cancer screening chest CT in June 2022 that was stable, did mention aortic atherosclerosis and emphysematous changes.  PCP prescribed rosuvastatin 5 mg daily, but she was hesitant to take the medication concerned about possible side effects.  I did address this with her today.  Past Medical History:  Diagnosis Date   Acid reflux    Anxiety    Arthritis    Colitis    Glaucoma    Gout    History of skin cancer    Inappropriate sinus tachycardia    Nail patellar syndrome    Abnormal nails of the hands usually the thumb, absent or hypertrophic patella, abnormal elbows, autosomal dominant, must be very careful with renal function   Shingles     Past Surgical History:  Procedure Laterality Date   BILIARY STENT PLACEMENT N/A 03/10/2017   Procedure: BILIARY STENT PLACEMENT;  Surgeon: Rogene Houston, MD;  [facility];  Service: Gastroenterology;  Laterality: N/A;   BIOPSY  06/30/2017   Procedure: BIOPSY AMPULA;  Surgeon: Rogene Houston, MD;  [facility];  Service: Gastroenterology;;   CATARACT EXTRACTION Bilateral    CESAREAN SECTION     x 4   CHOLECYSTECTOMY  1982   cholecystitis   COLONOSCOPY N/A 06/19/2014    Procedure: COLONOSCOPY;  Surgeon: Rogene Houston, MD;  [facility];  Service: Endoscopy;  Laterality: N/A;  730 - moved to 5/26 @ 12:00   COLONOSCOPY N/A 09/26/2014   Procedure: COLONOSCOPY;  Surgeon: Rogene Houston, MD;  [facility];  Service: Endoscopy;  Laterality: N/A;  1020 - moved to 11:15 - [name] to notify pt   COLONOSCOPY N/A 12/27/2017   Procedure: COLONOSCOPY;  Surgeon: Rogene Houston, MD;  [facility];  Service: Endoscopy;  Laterality: N/A;  12:00   ERCP N/A 03/10/2017   Procedure: ENDOSCOPIC RETROGRADE CHOLANGIOPANCREATOGRAPHY (ERCP) With sphincterotomy and stone extraction.;  Surgeon: Rogene Houston, MD;  [facility];  Service: Gastroenterology;  Laterality: N/A;   ERCP N/A 06/30/2017   Procedure: ENDOSCOPIC RETROGRADE CHOLANGIOPANCREATOGRAPHY (ERCP);  Surgeon: Rogene Houston, MD;  [facility];  Service: Endoscopy;  Laterality: N/A;  pt knows to arrive at 10:20   ESOPHAGOGASTRODUODENOSCOPY ENDOSCOPY  06/30/2017   Procedure: ESOPHAGOGASTRODUODENOSCOPY ENDOSCOPY;  Surgeon: Rogene Houston, MD;  [facility];  Service: Endoscopy;;   GASTROINTESTINAL STENT REMOVAL N/A 06/30/2017   Procedure: GASTROINTESTINAL STENT REMOVAL;  Surgeon: Rogene Houston, MD;  [facility];  Service: Endoscopy;  Laterality: N/A;   Growth in ear removed     1994   KNEE SURGERY Right    POLYPECTOMY  12/27/2017  Procedure: POLYPECTOMY;  Surgeon: Rogene Houston, MD;  [facility];  Service: Endoscopy;;  polyps at splenic flexure x3, cecal polyp, transverse polyps x3   REMOVAL OF STONES  06/30/2017   Procedure: REMOVAL OF STONES FRAGMENTS ;  Surgeon: Rogene Houston, MD;  [facility];  Service: Gastroenterology;;   rt knee replacement     2017   SPHINCTEROTOMY N/A 03/10/2017   Procedure: SPHINCTEROTOMY;  Surgeon: Rogene Houston, MD;  [facility];  Service: Gastroenterology;  Laterality: N/A;    stent eye Left    STONE EXTRACTION WITH BASKET N/A 03/10/2017   Procedure: STONE EXTRACTION WITH BASKET;  Surgeon: Rogene Houston, MD;  [facility];  Service: Gastroenterology;  Laterality: N/A;   TONSILLECTOMY     age 62    Current Outpatient Medications  Medication Sig Dispense Refill   acetaminophen (TYLENOL) 500 MG tablet Take 1,000 mg by mouth daily as needed for moderate pain.      albuterol (VENTOLIN HFA) 108 (90 Base) MCG/ACT inhaler SMARTSIG:1 Puff(s) Via Inhaler Every 4 Hours PRN     ANORO ELLIPTA 62.5-25 MCG/INH AEPB Inhale 1 puff into the lungs daily.     cholecalciferol (VITAMIN D3) 25 MCG (1000 UNIT) tablet Take 1,000 Units by mouth daily.     Cyanocobalamin (VITAMIN B-12 IJ) Inject 1 Dose as directed every 30 (thirty) days.      diazepam (VALIUM) 10 MG tablet Take 2.5 mg by mouth at bedtime as needed for sleep.     famotidine (PEPCID) 20 MG tablet Take 1 tablet (20 mg total) by mouth at bedtime as needed for heartburn or indigestion.     metoprolol tartrate (LOPRESSOR) 50 MG tablet TAKE 1 TABLET BY MOUTH TWICE A DAY 180 tablet 2   ondansetron (ZOFRAN) 4 MG tablet Take 1 tablet (4 mg total) by mouth 2 (two) times daily as needed for nausea or vomiting. 20 tablet 0   oxyCODONE (OXY IR/ROXICODONE) 5 MG immediate release tablet Take 5 mg by mouth daily as needed.     pantoprazole (PROTONIX) 40 MG tablet Take 1 tablet (40 mg total) by mouth daily before breakfast. 30 tablet 5   psyllium (METAMUCIL SMOOTH TEXTURE) 58.6 % powder Take 1 packet by mouth at bedtime.     spironolactone (ALDACTONE) 25 MG tablet Take 25 mg by mouth daily.      No current facility-administered medications for this visit.   Allergies:  Nsaids, Cortisone, and Demerol [meperidine]   ROS: No orthopnea or PND.  Physical Exam: VS:  BP (!) 142/80    Pulse 87    Ht 4\' 8"  (1.422 m)    Wt 142 lb (64.4 kg)    SpO2 97%    BMI 31.84 kg/m , BMI Body mass index is 31.84 kg/m.  Wt Readings from Last 3  Encounters:  02/08/21 142 lb (64.4 kg)  12/22/20 145 lb 3.2 oz (65.9 kg)  06/16/20 144 lb 4.8 oz (65.5 kg)    General: Patient appears comfortable at rest. HEENT: Conjunctiva and lids normal, wearing a mask. Neck: Supple, no elevated JVP or carotid bruits, no thyromegaly. Lungs: Clear to auscultation, nonlabored breathing at rest. Cardiac: Regular rate and rhythm, no S3 or significant systolic murmur. Extremities: No pitting edema.  ECG:  An ECG dated 02/05/2020 was personally reviewed today and demonstrated:  Sinus rhythm with short PR interval and diffuse nonspecific ST-T wave abnormalities.  Recent Labwork: 02/18/2020: ALT 27; AST 35  Other Studies Reviewed Today:  Echocardiogram 11/03/2016: - Left ventricle: The cavity size was normal. Wall thickness was    normal. Systolic function was normal. The estimated ejection    fraction was in the range of 60% to 65%. Doppler parameters are    consistent with abnormal left ventricular relaxation (grade 1    diastolic dysfunction).  - Aortic valve: Mildly calcified annulus. Probably trileaflet.  - Mitral valve: There was trivial regurgitation.  - Atrial septum: No defect or patent foramen ovale was identified.  - Tricuspid valve: There was trivial regurgitation.  - Pulmonary arteries: PA peak pressure: 22 mm Hg (S).  - Pericardium, extracardiac: A prominent pericardial fat pad was    present.   Impressions:   - Images are limited by poor sound wave transmission. Normal LV    wall thickness with LVEF 60-65% and grade 1 diastolic    dysfunction. Trivial mitral regurgitation. Mildly calcified    aortic annulus. Trivial tricuspid regurgitation with PASP 22    mmHg.   Assessment and Plan:  1.  History of inappropriate sinus tachycardia, ECG chronically abnormal but stable.  Plan to continue Lopressor at current dose with resting heart rate in the 80s today.  2.  Aortic atherosclerosis evident by CT imaging.  Would agree with  recommendation for statin therapy as made by PCP.  Requesting lipid panel for review.  I did talk with her about this some today as well.  3.  Essential hypertension on Aldactone.  Medication Adjustments/Labs and Tests Ordered: Current medicines are reviewed at length with the patient today.  Concerns regarding medicines are outlined above.   Tests Ordered: Orders Placed This Encounter  Procedures   EKG 12-Lead    Medication Changes: No orders of the defined types were placed in this encounter.   Disposition:  Follow up  1 year.  Signed, Satira Sark, MD, [facility] 02/08/2021 4:26 PM    [facility] [address], [place], [place] [address] Phone: [phone]; Fax: [phone]

## 2021-02-08 NOTE — Patient Instructions (Signed)
Medication Instructions:  Your physician recommends that you continue on your current medications as directed. Please refer to the Current Medication list given to you today.  *If you need a refill on your cardiac medications before your next appointment, please call your pharmacy*   Lab Work: None If you have labs (blood work) drawn today and your tests are completely normal, you will receive your results only by: [place] (if you have MyChart) OR A paper copy in the mail If you have any lab test that is abnormal or we need to change your treatment, we will call you to review the results.   Testing/Procedures: None   Follow-Up: At [facility], you and your health needs are our priority.  As part of our continuing mission to provide you with exceptional heart care, we have created designated Provider Care Teams.  These Care Teams include your primary Cardiologist (physician) and Advanced Practice Providers (APPs -  Physician Assistants and Nurse Practitioners) who all work together to provide you with the care you need, when you need it.  We recommend signing up for the patient portal called "MyChart".  Sign up information is provided on this After Visit Summary.  MyChart is used to connect with patients for Virtual Visits (Telemedicine).  Patients are able to view lab/test results, encounter notes, upcoming appointments, etc.  Non-urgent messages can be sent to your provider as well.   To learn more about what you can do with MyChart, go to [email or web address].    Your next appointment:   1 year(s)  The format for your next appointment:   In Person  Provider:   Rozann Lesches, MD    Other Instructions

## 2021-02-12 ENCOUNTER — Telehealth: Payer: Self-pay | Admitting: *Deleted

## 2021-02-12 NOTE — Telephone Encounter (Signed)
Patient informed and verbalized understanding of plan. 

## 2021-02-12 NOTE — Telephone Encounter (Signed)
-----   Message from Satira Sark, MD sent at 02/11/2021  3:46 PM EST ----- Results reviewed.  Lab work from PCP showed LDL 104.  With atherosclerosis by CT imaging, would agree with initiation of Crestor as originally recommended.

## 2021-03-28 ENCOUNTER — Other Ambulatory Visit: Payer: Self-pay | Admitting: Cardiology

## 2021-04-05 ENCOUNTER — Telehealth (INDEPENDENT_AMBULATORY_CARE_PROVIDER_SITE_OTHER): Payer: Self-pay | Admitting: *Deleted

## 2021-04-05 NOTE — Telephone Encounter (Signed)
Patient had ERCP 06/30/17. Left vm that she wants to get appt or do bw to see if bile duct is stopped up.  ? ?I called patient she states she was on pantoprazole for about 2 months and pcp stopped med due to potassium being low and told her to hold while on potassium. States she started having epigastric pain soon after stopped pantoprazole. States it feels like something is stuck there and has some cramping pain that comes and goes. Takes famotidine as needed and it does help. Has some nausea. States she had yeast infection, bladder infection and then covid. Finished med about 2 weeks ago. Wondering if stomach doesn't still feel right due to being on a lot of meds recently and stopping pantoprazole but states she does not want to go through what she did a few years ago when bile duct was stopped up. When asked if she was having any pain in RUQ she states at times but she thinks that is muscular and she states she does having itching at different times in different spots all over that she has not thought much about, no change in stools, no jaudice.  ? ?[phone] ?

## 2021-04-05 NOTE — Telephone Encounter (Signed)
Seems like there are multiple ongoing things for her right now. Can we try getting her for an earlier slot with any of the providers? ?Thanks ?

## 2021-04-06 NOTE — Telephone Encounter (Signed)
Pt called and notified Dr. Jenetta Downer wants to move up appt. Mitzie please move up appt ?

## 2021-04-08 ENCOUNTER — Encounter (INDEPENDENT_AMBULATORY_CARE_PROVIDER_SITE_OTHER): Payer: Self-pay | Admitting: Gastroenterology

## 2021-04-08 ENCOUNTER — Other Ambulatory Visit: Payer: Self-pay

## 2021-04-08 ENCOUNTER — Ambulatory Visit (INDEPENDENT_AMBULATORY_CARE_PROVIDER_SITE_OTHER): Payer: Medicare Other | Admitting: Gastroenterology

## 2021-04-08 VITALS — BP 152/83 | HR 103 | Temp 97.9°F | Ht <= 58 in | Wt 140.5 lb

## 2021-04-08 DIAGNOSIS — R1013 Epigastric pain: Secondary | ICD-10-CM

## 2021-04-08 DIAGNOSIS — K580 Irritable bowel syndrome with diarrhea: Secondary | ICD-10-CM | POA: Diagnosis not present

## 2021-04-08 NOTE — Progress Notes (Signed)
Maylon Peppers, M.D. ?Gastroenterology & Hepatology ?[facility] ?[address] ?[place], [place] [address] ? ?Primary Care Physician: ?Neale Burly, MD ?[address] ?[place] [address] ? ?I will communicate my assessment and recommendations to the referring MD via EMR. ? ?Problems: ?IBS-D ?Functional dyspepsia ? ?History of Present Illness: ?Kathryn Meyers is a 76 y.o. female with past medical history of IBS-D, GERD, choledocholithiasis status post sphincterotomy and stone removal, who presents for follow up of IBS-D. ? ?The patient was last seen on 12/22/2020. At that time, the patient was started on pantoprazole 40 mg daily and morphine over-the-counter as needed.  She was also advised to take Metamucil at bedtime.  Celiac disease panel was tested which was negative. ? ?Patient reports that during her most recent blood workup she was found to have low potassium. She was advised by her PCP to take potassium but she was also told that this could be related to the pantoprazole. States that she was advised to stop the pantoprazole as advised by her PCP after taking it for 2 months.  She is also concerned as she presenting worsening abdominal pain in the epigastric area.  She reports that while taking the Protonix she did not have any pain.  States if she takes famotidine as needed, we improves the symptoms of abdominal pain.Patient is concerned as she has decreased her food intake to two small meals a day as she has no appetite. Has not lost weight due to this. She has felt very weak overall. She has presented recurrent nausea with some dry heaving but no vomiting on a daily basis. Has tried Zofran as needed for nausea. ? ?States that she had COVID in January 2022 and has not felt well since then. ? ?Takes oxycodone 2.5 mg infrequently, possibly every 2 weeks. ? ?The patient denies having any nausea, vomiting, fever, chills, hematochezia, melena,  hematemesis, abdominal distention, worsening diarrhea, jaundice, pruritus or weight loss. ? ?Last EGD: 06/30/2017  ?The Z-line was regular and was found 34 cm from the incisors. ?A 2 cm hiatal hernia was present. ?Patchy mild inflammation characterized by erythema and granularity was found in the gastric antrum. ?The exam was otherwise without abnormality. ?Patchy mild inflammation characterized by congestion (edema) and erythema was found in the duodenal ?bulb. ? ?Last Colonoscopy: 12/27/2017, small polyp in the cecum resected with cold forceps, 3 polyps in the mid transverse colon resected with a cold snare, 3 polyps in the splenic flexure presenting with cold snare, diverticulosis.  All polyps were less than 1 cm.  External and internal hemorrhoids.  Pathology showed multiple fragments of tubular adenomas. ? ?Recommended to repeat in December 2024 ? ?Past Medical History: ?Past Medical History:  ?Diagnosis Date  ? Acid reflux   ? Anxiety   ? Arthritis   ? Colitis   ? Glaucoma   ? Gout   ? History of skin cancer   ? Inappropriate sinus tachycardia   ? Nail patellar syndrome   ? Abnormal nails of the hands usually the thumb, absent or hypertrophic patella, abnormal elbows, autosomal dominant, must be very careful with renal function  ? Shingles   ? ? ?Past Surgical History: ?Past Surgical History:  ?Procedure Laterality Date  ? BILIARY STENT PLACEMENT N/A 03/10/2017  ? Procedure: BILIARY STENT PLACEMENT;  Surgeon: Rogene Houston, MD;  [facility];  Service: Gastroenterology;  Laterality: N/A;  ? BIOPSY  06/30/2017  ? Procedure: BIOPSY AMPULA;  Surgeon: Rogene Houston, MD;  [facility];  Service: Gastroenterology;;  ? CATARACT EXTRACTION Bilateral   ? CESAREAN SECTION    ? x 4  ? CHOLECYSTECTOMY  1982  ? cholecystitis  ? COLONOSCOPY N/A 06/19/2014  ? Procedure: COLONOSCOPY;  Surgeon: Rogene Houston, MD;  [facility];  Service: Endoscopy;  Laterality: N/A;  730 - moved to 5/26 @  12:00  ? COLONOSCOPY N/A 09/26/2014  ? Procedure: COLONOSCOPY;  Surgeon: Rogene Houston, MD;  [facility];  Service: Endoscopy;  Laterality: N/A;  1020 - moved to 11:15 - [name] to notify pt  ? COLONOSCOPY N/A 12/27/2017  ? Procedure: COLONOSCOPY;  Surgeon: Rogene Houston, MD;  [facility];  Service: Endoscopy;  Laterality: N/A;  12:00  ? ERCP N/A 03/10/2017  ? Procedure: ENDOSCOPIC RETROGRADE CHOLANGIOPANCREATOGRAPHY (ERCP) With sphincterotomy and stone extraction.;  Surgeon: Rogene Houston, MD;  [facility];  Service: Gastroenterology;  Laterality: N/A;  ? ERCP N/A 06/30/2017  ? Procedure: ENDOSCOPIC RETROGRADE CHOLANGIOPANCREATOGRAPHY (ERCP);  Surgeon: Rogene Houston, MD;  [facility];  Service: Endoscopy;  Laterality: N/A;  pt knows to arrive at 10:20  ? ESOPHAGOGASTRODUODENOSCOPY ENDOSCOPY  06/30/2017  ? Procedure: ESOPHAGOGASTRODUODENOSCOPY ENDOSCOPY;  Surgeon: Rogene Houston, MD;  [facility];  Service: Endoscopy;;  ? GASTROINTESTINAL STENT REMOVAL N/A 06/30/2017  ? Procedure: GASTROINTESTINAL STENT REMOVAL;  Surgeon: Rogene Houston, MD;  [facility];  Service: Endoscopy;  Laterality: N/A;  ? Growth in ear removed    ? 1994  ? KNEE SURGERY Right   ? POLYPECTOMY  12/27/2017  ? Procedure: POLYPECTOMY;  Surgeon: Rogene Houston, MD;  [facility];  Service: Endoscopy;;  polyps at splenic flexure x3, cecal polyp, transverse polyps x3  ? REMOVAL OF STONES  06/30/2017  ? Procedure: REMOVAL OF STONES FRAGMENTS ;  Surgeon: Rogene Houston, MD;  [facility];  Service: Gastroenterology;;  ? rt knee replacement    ? 2017  ? SPHINCTEROTOMY N/A 03/10/2017  ? Procedure: SPHINCTEROTOMY;  Surgeon: Rogene Houston, MD;  [facility];  Service: Gastroenterology;  Laterality: N/A;  ? stent eye Left   ? STONE EXTRACTION WITH BASKET N/A 03/10/2017  ? Procedure: STONE EXTRACTION WITH BASKET;  Surgeon: Rogene Houston, MD;   [facility];  Service: Gastroenterology;  Laterality: N/A;  ? TONSILLECTOMY    ? age 55  ? ? ?Family History: ?Family History  ?Problem Relation Age of Onset  ? Dementia Mother   ? Alcoholism Father   ? ? ?Social History: ?Social History  ? ?Tobacco Use  ?Smoking Status Every Day  ? Packs/day: 1.00  ? Years: 34.00  ? Pack years: 34.00  ? Types: Cigarettes  ? Start date: 03/21/1977  ?Smokeless Tobacco Never  ?Tobacco Comments  ? smokes 1 pack per day 11/27/2019  ? ?Social History  ? ?Substance and Sexual Activity  ?Alcohol Use No  ? Alcohol/week: 0.0 standard drinks  ? ?Social History  ? ?Substance and Sexual Activity  ?Drug Use No  ? ? ?Allergies: ?Allergies  ?Allergen Reactions  ? Nsaids Other (See Comments)  ?  Rectal bleeding.  ? Cortisone Other (See Comments)  ?  Dizzy/flush/rapid heart beat.  ? Demerol [Meperidine] Nausea And Vomiting  ? ? ?Medications: ?Current Outpatient Medications  ?Medication Sig Dispense Refill  ? acetaminophen (TYLENOL) 500 MG tablet Take 1,000 mg by mouth daily as  needed for moderate pain.     ? albuterol (VENTOLIN HFA) 108 (90 Base) MCG/ACT inhaler SMARTSIG:1 Puff(s) Via Inhaler Every 4 Hours PRN    ? ANORO ELLIPTA 62.5-25 MCG/INH AEPB Inhale 1 puff into the lungs daily.    ? cholecalciferol (VITAMIN D3) 25 MCG (1000 UNIT) tablet Take 1,000 Units by mouth daily.    ? Cyanocobalamin (VITAMIN B-12 IJ) Inject 1 Dose as directed every 30 (thirty) days.     ? diazepam (VALIUM) 10 MG tablet Take 2.5 mg by mouth at bedtime as needed for sleep.    ? famotidine (PEPCID) 20 MG tablet Take 1 tablet (20 mg total) by mouth at bedtime as needed for heartburn or indigestion.    ? metoprolol tartrate (LOPRESSOR) 50 MG tablet TAKE 1 TABLET BY MOUTH TWICE A DAY 180 tablet 3  ? ondansetron (ZOFRAN) 4 MG tablet Take 1 tablet (4 mg total) by mouth 2 (two) times daily as needed for nausea or vomiting. 20 tablet 0  ? oxyCODONE (OXY IR/ROXICODONE) 5 MG immediate release tablet Take 5 mg by mouth  daily as needed.    ? pantoprazole (PROTONIX) 40 MG tablet Take 1 tablet (40 mg total) by mouth daily before breakfast. 30 tablet 5  ? spironolactone (ALDACTONE) 25 MG tablet Take 25 mg by mouth daily.     ? psyllium (MET

## 2021-04-08 NOTE — Patient Instructions (Addendum)
Take famotidine 20 mg qday ?Schedule CT abdomen/pelvis with IV contrast ?Perform blood workup ?Continue zofran as needed for nausea ?

## 2021-04-09 LAB — CBC WITH DIFFERENTIAL/PLATELET
Absolute Monocytes: 870 cells/uL (ref 200–950)
Basophils Absolute: 115 cells/uL (ref 0–200)
Basophils Relative: 0.9 %
Eosinophils Absolute: 192 cells/uL (ref 15–500)
Eosinophils Relative: 1.5 %
HCT: 49 % — ABNORMAL HIGH (ref 35.0–45.0)
Hemoglobin: 16.4 g/dL — ABNORMAL HIGH (ref 11.7–15.5)
Lymphs Abs: 3341 cells/uL (ref 850–3900)
MCH: 30.2 pg (ref 27.0–33.0)
MCHC: 33.5 g/dL (ref 32.0–36.0)
MCV: 90.2 fL (ref 80.0–100.0)
MPV: 10.7 fL (ref 7.5–12.5)
Monocytes Relative: 6.8 %
Neutro Abs: 8282 cells/uL — ABNORMAL HIGH (ref 1500–7800)
Neutrophils Relative %: 64.7 %
Platelets: 415 10*3/uL — ABNORMAL HIGH (ref 140–400)
RBC: 5.43 10*6/uL — ABNORMAL HIGH (ref 3.80–5.10)
RDW: 12.2 % (ref 11.0–15.0)
Total Lymphocyte: 26.1 %
WBC: 12.8 10*3/uL — ABNORMAL HIGH (ref 3.8–10.8)

## 2021-04-09 LAB — COMPREHENSIVE METABOLIC PANEL
AG Ratio: 1.3 (calc) (ref 1.0–2.5)
ALT: 19 U/L (ref 6–29)
AST: 24 U/L (ref 10–35)
Albumin: 4.3 g/dL (ref 3.6–5.1)
Alkaline phosphatase (APISO): 80 U/L (ref 37–153)
BUN: 7 mg/dL (ref 7–25)
CO2: 25 mmol/L (ref 20–32)
Calcium: 10.2 mg/dL (ref 8.6–10.4)
Chloride: 98 mmol/L (ref 98–110)
Creat: 0.72 mg/dL (ref 0.60–1.00)
Globulin: 3.2 g/dL (calc) (ref 1.9–3.7)
Glucose, Bld: 101 mg/dL — ABNORMAL HIGH (ref 65–99)
Potassium: 3.6 mmol/L (ref 3.5–5.3)
Sodium: 138 mmol/L (ref 135–146)
Total Bilirubin: 0.8 mg/dL (ref 0.2–1.2)
Total Protein: 7.5 g/dL (ref 6.1–8.1)

## 2021-04-12 NOTE — Telephone Encounter (Signed)
error 

## 2021-04-21 ENCOUNTER — Ambulatory Visit: Payer: Medicare Other | Admitting: Urology

## 2021-04-29 ENCOUNTER — Telehealth (INDEPENDENT_AMBULATORY_CARE_PROVIDER_SITE_OTHER): Payer: Self-pay | Admitting: Gastroenterology

## 2021-04-29 NOTE — Telephone Encounter (Signed)
I called the patient to inform about the results of CT scan of the abdomen and pelvis with IV contrast which showed presence of fatty liver but no other abnormality.  She reports feeling slightly better compared to prior and believes her symptoms are related to her previous exposure to [place].  She would like to hold off in any further investigations for now.  The patient understood and agreed. ? ?Maylon Peppers, MD ?Gastroenterology and Hepatology ?[facility] ? ?

## 2021-05-21 ENCOUNTER — Ambulatory Visit: Payer: Medicare Other | Admitting: Urology

## 2021-06-15 ENCOUNTER — Encounter (INDEPENDENT_AMBULATORY_CARE_PROVIDER_SITE_OTHER): Payer: Self-pay | Admitting: Internal Medicine

## 2021-06-15 ENCOUNTER — Ambulatory Visit (INDEPENDENT_AMBULATORY_CARE_PROVIDER_SITE_OTHER): Payer: Medicare Other | Admitting: Internal Medicine

## 2021-06-15 VITALS — BP 131/79 | HR 76 | Temp 97.9°F | Ht <= 58 in | Wt 139.7 lb

## 2021-06-15 DIAGNOSIS — R1013 Epigastric pain: Secondary | ICD-10-CM

## 2021-06-15 DIAGNOSIS — K219 Gastro-esophageal reflux disease without esophagitis: Secondary | ICD-10-CM | POA: Diagnosis not present

## 2021-06-15 DIAGNOSIS — Z860100 Personal history of colon polyps, unspecified: Secondary | ICD-10-CM

## 2021-06-15 MED ORDER — PANTOPRAZOLE SODIUM 40 MG PO TBEC: 40.0000 mg | DELAYED_RELEASE_TABLET | Freq: Every day | ORAL | 5 refills | Status: DC

## 2021-06-15 NOTE — Patient Instructions (Signed)
Take pantoprazole 40 mg by mouth 30 minutes before breakfast daily for 3 months.  After 3 months drop dose to every other day.  If epigastric pain recurs can go back to taking it every day. Can try Metamucil if IBS symptoms i.e. pain and her diarrhea recurs. Office visit in 6 months.

## 2021-06-15 NOTE — Progress Notes (Signed)
Presenting complaint;  Follow-up for epigastric.  GERD and IBS.  Database and subjective:  Patient is 76 year old Caucasian female who is here for scheduled visit.  She has history of choledocholithiasis for which she underwent therapeutic ERCP February and July 13, 2017.  She also has history of GERD diarrhea and colonic polyps.  Her last colonoscopy was in December 2019 with removal of 7 small tubular adenomas.  On her last visit she was advised to take pantoprazole 40 mg daily.  She was also screened for celiac disease and celiac disease panel was negative.  In the meantime she was seen by her primary care physician and she was advised to stop pantoprazole.  Patient gives the impression that pantoprazole was thought to be because of her hypokalemia. Patient was seen by Dr. Jenetta Downer on 04/08/2021 for worsening epigastric pain. Transaminases and serum calcium are normal.  CBC revealed H&H of 16.4 and 49.0.  She underwent CT abdomen and pelvis at [facility] on 04/23/2021.  Changes of fatty infiltration noted involving right hepatic lobe.  There was no evidence of choledocholithiasis or pancreatitis.  She had left-sided diverticulosis.  Patient continues to experience intermittent right upper quadrant and epigastric pain.  She feels epigastric pain sometimes restricted by use of inhalers.  This pain does not radiate into her back.  It is usually mild.  She continues to have nausea every couple of weeks and she is using ondansetron.  She says she takes famotidine maybe once a week for heartburn.  She has not had good appetite since she had COVID in March this year.  She usually has 2 formed stools daily.  She denies melena or rectal bleeding.  She remains with intermittent myalgias and joint pains.  She reports intermittent swelling to her wrist and elbow joints.  She says it resolves after a few days.  Dr.Hasanaj thought she was having gout attacks.  She was given Colcrys but she stopped it after couple of doses  because of nausea vomiting and diarrhea. Even though she has poor appetite she has not lost any weight since her last visit. She is not taking Metamucil as recommended on her last visit.   Current Medications: Outpatient Encounter Medications as of 06/15/2021  Medication Sig   acetaminophen (TYLENOL) 500 MG tablet Take 1,000 mg by mouth daily as needed for moderate pain.    albuterol (VENTOLIN HFA) 108 (90 Base) MCG/ACT inhaler SMARTSIG:1 Puff(s) Via Inhaler Every 4 Hours PRN   ANORO ELLIPTA 62.5-25 MCG/INH AEPB Inhale 1 puff into the lungs daily.   cholecalciferol (VITAMIN D3) 25 MCG (1000 UNIT) tablet Take 1,000 Units by mouth daily.   Cyanocobalamin (VITAMIN B-12 IJ) Inject 1 Dose as directed every 30 (thirty) days.    diazepam (VALIUM) 10 MG tablet Take 2.5 mg by mouth at bedtime as needed for sleep.   famotidine (PEPCID) 20 MG tablet Take 1 tablet (20 mg total) by mouth at bedtime as needed for heartburn or indigestion.   metoprolol tartrate (LOPRESSOR) 50 MG tablet TAKE 1 TABLET BY MOUTH TWICE A DAY   ondansetron (ZOFRAN) 4 MG tablet Take 1 tablet (4 mg total) by mouth 2 (two) times daily as needed for nausea or vomiting.   oxyCODONE (OXY IR/ROXICODONE) 5 MG immediate release tablet Take 5 mg by mouth daily as needed.   spironolactone (ALDACTONE) 25 MG tablet Take 25 mg by mouth daily.    pantoprazole (PROTONIX) 40 MG tablet Take 1 tablet (40 mg total) by mouth daily before breakfast. (Patient not  taking: Reported on 06/15/2021)   psyllium (METAMUCIL SMOOTH TEXTURE) 58.6 % powder Take 1 packet by mouth at bedtime. (Patient not taking: Reported on 04/08/2021)   No facility-administered encounter medications on file as of 06/15/2021.     Objective: Blood pressure 131/79, pulse 76, temperature 97.9 F (36.6 C), temperature source Oral, height '4\' 8"'$  (1.422 m), weight 139 lb 11.2 oz (63.4 kg). Patient is alert and in no acute distress. Conjunctiva is pink. Sclera is  nonicteric Oropharyngeal mucosa is normal. No neck masses or thyromegaly noted. Cardiac exam with regular rhythm normal S1 and S2. No murmur or gallop noted. Lungs are clear to auscultation. Abdomen is symmetrical.  She has long right subcostal scar.  On palpation abdomen is soft with mild midepigastric tenderness.  No organomegaly or masses. No LE edema or clubbing noted.  Labs/studies Results:      Latest Ref Rng & Units 04/08/2021   10:55 AM 10/24/2018   11:56 AM 06/23/2017   11:31 AM  CBC  WBC 3.8 - 10.8 Thousand/uL 12.8   13.5   10.9    Hemoglobin 11.7 - 15.5 g/dL 16.4   15.1   15.3    Hematocrit 35.0 - 45.0 % 49.0   45.7   46.2    Platelets 140 - 400 Thousand/uL 415   431   375         Latest Ref Rng & Units 04/08/2021   10:55 AM 02/18/2020    2:33 PM 10/24/2018   11:56 AM  CMP  Glucose 65 - 99 mg/dL 101    97    BUN 7 - 25 mg/dL 7    5    Creatinine 0.60 - 1.00 mg/dL 0.72    0.70    Sodium 135 - 146 mmol/L 138    141    Potassium 3.5 - 5.3 mmol/L 3.6    3.3    Chloride 98 - 110 mmol/L 98    101    CO2 20 - 32 mmol/L 25    25    Calcium 8.6 - 10.4 mg/dL 10.2    10.0    Total Protein 6.1 - 8.1 g/dL 7.5   6.9   7.6    Total Bilirubin 0.2 - 1.2 mg/dL 0.8   0.6   0.6    AST 10 - 35 U/L 24   35   21    ALT 6 - 29 U/L '19   27   14         '$ Latest Ref Rng & Units 04/08/2021   10:55 AM 02/18/2020    2:33 PM 10/24/2018   11:56 AM  Hepatic Function  Total Protein 6.1 - 8.1 g/dL 7.5   6.9   7.6    AST 10 - 35 U/L 24   35   21    ALT 6 - 29 U/L '19   27   14    '$ Total Bilirubin 0.2 - 1.2 mg/dL 0.8   0.6   0.6    Bilirubin, Direct 0.0 - 0.2 mg/dL  0.2       Lab Results  Component Value Date   CRP 9.1 (H) 10/24/2018    Summary of CT finding from 04/23/2021 IMPRESSION:  1. No CT evidence for acute intra-abdominal or pelvic abnormality.  2. Heterogenous hypodensity in the right hepatic lobe potentially  due to geographic fat infiltration, suggest correlation with LFTs.   Confirmation could be obtained with MRI.  3. Mild diverticular disease  of the left colon without acute  inflammatory process.   Assessment:  #1.  Recurrent epigastric pain.  She either has dyspepsia or she could also have atypical presentation with GERD.  She has a history of choledocholithiasis but there is no evidence of recurrence based on CT from October 2020 and March 2023.  She did experience resolution of this pain when she was treated with pantoprazole last year.  It is unclear as to why PPI was discontinued.  #2.  Chronic GERD.  Presently she is on dietary measures and as needed famotidine.  With pantoprazole she may not need famotidine.  #3.  History of IBS/diarrhea.  She is presently having normal bowel movements.  If diarrhea recurs she will start taking Metamucil.  #4.  History of colonic adenomas.  Last colonoscopy was in December 2019.  Next colonoscopy in December 2024.   Plan:  Patient advised to go back on pantoprazole at a dose of 40 mg by mouth 30 minutes before breakfast daily for 3 months.  Thereafter she can drop the dose to every other day.  If epigastric pain or heartburn recurs she can go back to taking it every day and also let us know. Metamucil 1 heaping tablespoonful daily if diarrhea recurs. Once again patient advised to talk with Dr. Sherrie Sport about referral to rheumatologist regarding recurrent painful joint swelling. Office visit Dr. Jenetta Downer in 6 months.

## 2021-08-09 ENCOUNTER — Ambulatory Visit (INDEPENDENT_AMBULATORY_CARE_PROVIDER_SITE_OTHER): Payer: Medicare Other | Admitting: Gastroenterology

## 2021-09-09 ENCOUNTER — Ambulatory Visit (INDEPENDENT_AMBULATORY_CARE_PROVIDER_SITE_OTHER): Payer: Medicare Other | Admitting: Gastroenterology

## 2021-11-29 ENCOUNTER — Encounter (INDEPENDENT_AMBULATORY_CARE_PROVIDER_SITE_OTHER): Payer: Self-pay | Admitting: Gastroenterology

## 2021-11-29 ENCOUNTER — Ambulatory Visit (INDEPENDENT_AMBULATORY_CARE_PROVIDER_SITE_OTHER): Payer: Medicare Other | Admitting: Gastroenterology

## 2021-11-29 VITALS — BP 138/81 | HR 83 | Temp 97.5°F | Ht <= 58 in | Wt 140.6 lb

## 2021-11-29 DIAGNOSIS — K219 Gastro-esophageal reflux disease without esophagitis: Secondary | ICD-10-CM | POA: Diagnosis not present

## 2021-11-29 DIAGNOSIS — K760 Fatty (change of) liver, not elsewhere classified: Secondary | ICD-10-CM

## 2021-11-29 DIAGNOSIS — K580 Irritable bowel syndrome with diarrhea: Secondary | ICD-10-CM | POA: Diagnosis not present

## 2021-11-29 NOTE — Progress Notes (Signed)
Maylon Peppers, M.D. Gastroenterology & Hepatology [facility] [address] [place], [place] [address]  Primary Care Physician: Neale Burly, MD [place] [address]  I will communicate my assessment and recommendations to the referring MD via EMR.  Problems: IBS-D Functional dyspepsia  History of Present Illness: Kathryn Meyers is a 76 y.o. female  with past medical history of IBS-D, GERD, choledocholithiasis status post sphincterotomy and stone removal, who presents for follow up of nausea and decreased appetite.  The patient was last seen on 06/15/21. She was advised to go back to pantoprazole 40 mg qday and taking Metamucil daily.  The patient denies having any vomiting, fever, chills, hematochezia, melena, hematemesis, abdominal distention, abdominal pain, diarrhea, jaundice, pruritus or weight changes - has remained at 140 lb. Her husband is concerned as she has had decreased appetite and with low energy. She has had some worsening nausea but she reports that it has not been concerning for her. States that after she had COVID, food tastes like cardboard, which has not helped with her appetite.She very occasionally has some pain in her LLQ, which she attributes to food she takes occasionally, as well in the epigastric area.  Patient reports that she takes Pepcid rarely for heartburn, which helps with her symptoms effectively.  Underwent a CT abdomen and pelvis with IV contrast on 04/2021 at outside facility which showed fatty liver without any other abnormalities.  She was given a prescription for Zofran as needed for nausea.  Last EGD: 06/30/2017  The Z-line was regular and was found 34 cm from the incisors. A 2 cm hiatal hernia was present. Patchy mild inflammation characterized by erythema and granularity was found in the gastric antrum. The exam was otherwise without abnormality. Patchy mild inflammation  characterized by congestion (edema) and erythema was found in the duodenal bulb.   Last Colonoscopy: 12/27/2017, small polyp in the cecum resected with cold forceps, 3 polyps in the mid transverse colon resected with a cold snare, 3 polyps in the splenic flexure presenting with cold snare, diverticulosis.  All polyps were less than 1 cm.  External and internal hemorrhoids.  Pathology showed multiple fragments of tubular adenomas.   Recommended to repeat in December 2024  Past Medical History: Past Medical History:  Diagnosis Date   Acid reflux    Anxiety    Arthritis    Colitis    Glaucoma    Gout    History of skin cancer    Inappropriate sinus tachycardia    Nail patellar syndrome    Abnormal nails of the hands usually the thumb, absent or hypertrophic patella, abnormal elbows, autosomal dominant, must be very careful with renal function   Shingles     Past Surgical History: Past Surgical History:  Procedure Laterality Date   BILIARY STENT PLACEMENT N/A 03/10/2017   Procedure: BILIARY STENT PLACEMENT;  Surgeon: Rogene Houston, MD;  [facility];  Service: Gastroenterology;  Laterality: N/A;   BIOPSY  06/30/2017   Procedure: BIOPSY AMPULA;  Surgeon: Rogene Houston, MD;  [facility];  Service: Gastroenterology;;   CATARACT EXTRACTION Bilateral    CESAREAN SECTION     x 4   CHOLECYSTECTOMY  1982   cholecystitis   COLONOSCOPY N/A 06/19/2014   Procedure: COLONOSCOPY;  Surgeon: Rogene Houston, MD;  [facility];  Service: Endoscopy;  Laterality: N/A;  730 - moved to 5/26 @ 12:00   COLONOSCOPY  N/A 09/26/2014   Procedure: COLONOSCOPY;  Surgeon: Rogene Houston, MD;  [facility];  Service: Endoscopy;  Laterality: N/A;  1020 - moved to 11:15 - [name] to notify pt   COLONOSCOPY N/A 12/27/2017   Procedure: COLONOSCOPY;  Surgeon: Rogene Houston, MD;  [facility];  Service: Endoscopy;  Laterality: N/A;  12:00   ERCP N/A 03/10/2017    Procedure: ENDOSCOPIC RETROGRADE CHOLANGIOPANCREATOGRAPHY (ERCP) With sphincterotomy and stone extraction.;  Surgeon: Rogene Houston, MD;  [facility];  Service: Gastroenterology;  Laterality: N/A;   ERCP N/A 06/30/2017   Procedure: ENDOSCOPIC RETROGRADE CHOLANGIOPANCREATOGRAPHY (ERCP);  Surgeon: Rogene Houston, MD;  [facility];  Service: Endoscopy;  Laterality: N/A;  pt knows to arrive at 10:20   ESOPHAGOGASTRODUODENOSCOPY ENDOSCOPY  06/30/2017   Procedure: ESOPHAGOGASTRODUODENOSCOPY ENDOSCOPY;  Surgeon: Rogene Houston, MD;  [facility];  Service: Endoscopy;;   GASTROINTESTINAL STENT REMOVAL N/A 06/30/2017   Procedure: GASTROINTESTINAL STENT REMOVAL;  Surgeon: Rogene Houston, MD;  [facility];  Service: Endoscopy;  Laterality: N/A;   Growth in ear removed     1994   KNEE SURGERY Right    POLYPECTOMY  12/27/2017   Procedure: POLYPECTOMY;  Surgeon: Rogene Houston, MD;  [facility];  Service: Endoscopy;;  polyps at splenic flexure x3, cecal polyp, transverse polyps x3   REMOVAL OF STONES  06/30/2017   Procedure: REMOVAL OF STONES FRAGMENTS ;  Surgeon: Rogene Houston, MD;  [facility];  Service: Gastroenterology;;   rt knee replacement     2017   SPHINCTEROTOMY N/A 03/10/2017   Procedure: SPHINCTEROTOMY;  Surgeon: Rogene Houston, MD;  [facility];  Service: Gastroenterology;  Laterality: N/A;   stent eye Left    STONE EXTRACTION WITH BASKET N/A 03/10/2017   Procedure: STONE EXTRACTION WITH BASKET;  Surgeon: Rogene Houston, MD;  [facility];  Service: Gastroenterology;  Laterality: N/A;   TONSILLECTOMY     age 37    Family History: Family History  Problem Relation Age of Onset   Dementia Mother    Alcoholism Father     Social History: Social History   Tobacco Use  Smoking Status Every Day   Packs/day: 1.00   Years: 34.00   Total pack years: 34.00   Types: Cigarettes   Start date:  03/21/1977   Passive exposure: Current  Smokeless Tobacco Never  Tobacco Comments   smokes 1 pack per day 11/27/2019   Social History   Substance and Sexual Activity  Alcohol Use No   Alcohol/week: 0.0 standard drinks of alcohol   Social History   Substance and Sexual Activity  Drug Use No    Allergies: Allergies  Allergen Reactions   Nsaids Other (See Comments)    Rectal bleeding.   Cortisone Other (See Comments)    Dizzy/flush/rapid heart beat.   Demerol [Meperidine] Nausea And Vomiting    Medications: Current Outpatient Medications  Medication Sig Dispense Refill   acetaminophen (TYLENOL) 500 MG tablet Take 1,000 mg by mouth daily as needed for moderate pain.      albuterol (VENTOLIN HFA) 108 (90 Base) MCG/ACT inhaler SMARTSIG:1 Puff(s) Via Inhaler Every 4 Hours PRN     ANORO ELLIPTA 62.5-25 MCG/INH AEPB Inhale 1 puff into the lungs daily.     cholecalciferol (VITAMIN D3) 25 MCG (1000 UNIT) tablet Take 1,000 Units by mouth daily.     Cyanocobalamin (VITAMIN B-12  IJ) Inject 1 Dose as directed every 30 (thirty) days.      diazepam (VALIUM) 10 MG tablet Take 2.5 mg by mouth at bedtime as needed for sleep.     famotidine (PEPCID) 20 MG tablet Take 20 mg by mouth at bedtime as needed for heartburn or indigestion. As needed     metoprolol tartrate (LOPRESSOR) 50 MG tablet TAKE 1 TABLET BY MOUTH TWICE A DAY 180 tablet 3   ondansetron (ZOFRAN) 4 MG tablet Take 1 tablet (4 mg total) by mouth 2 (two) times daily as needed for nausea or vomiting. 20 tablet 0   oxyCODONE (OXY IR/ROXICODONE) 5 MG immediate release tablet Take 5 mg by mouth daily as needed. 2.5 mg prn     spironolactone (ALDACTONE) 25 MG tablet Take 25 mg by mouth daily.      pantoprazole (PROTONIX) 40 MG tablet Take 1 tablet (40 mg total) by mouth daily before breakfast. (Patient not taking: Reported on 11/29/2021) 30 tablet 5   psyllium (METAMUCIL SMOOTH TEXTURE) 58.6 % powder Take 1 packet by mouth at bedtime.  (Patient not taking: Reported on 04/08/2021)     No current facility-administered medications for this visit.    Review of Systems: GENERAL: negative for malaise, night sweats HEENT: No changes in hearing or vision, no nose bleeds or other nasal problems. NECK: Negative for lumps, goiter, pain and significant neck swelling RESPIRATORY: Negative for cough, wheezing CARDIOVASCULAR: Negative for chest pain, leg swelling, palpitations, orthopnea GI: SEE HPI MUSCULOSKELETAL: Negative for joint pain or swelling, back pain, and muscle pain. SKIN: Negative for lesions, rash PSYCH: Negative for sleep disturbance, mood disorder and recent psychosocial stressors. HEMATOLOGY Negative for prolonged bleeding, bruising easily, and swollen nodes. ENDOCRINE: Negative for cold or heat intolerance, polyuria, polydipsia and goiter. NEURO: negative for tremor, gait imbalance, syncope and seizures. The remainder of the review of systems is noncontributory.   Physical Exam: BP 138/81 (BP Location: Left Arm, Patient Position: Sitting, Cuff Size: Small)   Pulse 83   Temp (!) 97.5 F (36.4 C) (Oral)   Ht '4\' 8"'$  (1.422 m)   Wt 140 lb 9.6 oz (63.8 kg)   BMI 31.52 kg/m  GENERAL: The patient is AO x3, in no acute distress. HEENT: Head is normocephalic and atraumatic. EOMI are intact. Mouth is well hydrated and without lesions. NECK: Supple. No masses LUNGS: Clear to auscultation. No presence of rhonchi/wheezing/rales. Adequate chest expansion HEART: RRR, normal s1 and s2. ABDOMEN: Soft, nontender, no guarding, no peritoneal signs, and nondistended. BS +. No masses. EXTREMITIES: Without any cyanosis, clubbing, rash, lesions or edema. NEUROLOGIC: AOx3, no focal motor deficit. SKIN: no jaundice, no rashes  Imaging/Labs: as above  I personally reviewed and interpreted the available labs, imaging and endoscopic files.  Impression and Plan: Kathryn Meyers is a 76 y.o. female  with past medical history of  IBS-D, GERD, choledocholithiasis status post sphincterotomy and stone removal, who presents for follow up of nausea and decreased appetite. The patient has presented persistent nausea and poor appetite after she had COVID infection, but has not presented any associated symptoms or red flag signs. Cross sectional abdominal imaging is unremarkable. The patient is not interested in pursuing an EGD as she states her symptoms are present infrequently and are not concerning for her, which I consider is reasonable. I advised if her symptoms were to worse, she could reach [place] back to proceed with an EGD.   She is rarely presenting any heartburn for which she can  continue taking famotidine PRN.  Finally, we discussed that even though imaging showed fatty liver, she did not have any LFT elevation. She would benefit from further weight loss but no further management is warranted at this point.  - Continue with famotidine as needed for heartburn - Try to liberalize diet as much as possible  All questions were answered.      Maylon Peppers, MD Gastroenterology and Hepatology [facility]

## 2021-11-29 NOTE — Patient Instructions (Signed)
Continue with famotidine as needed for heartburn Try to liberalize diet as much as possible

## 2021-12-04 ENCOUNTER — Encounter (INDEPENDENT_AMBULATORY_CARE_PROVIDER_SITE_OTHER): Payer: Self-pay | Admitting: Gastroenterology

## 2021-12-17 ENCOUNTER — Other Ambulatory Visit (INDEPENDENT_AMBULATORY_CARE_PROVIDER_SITE_OTHER): Payer: Self-pay | Admitting: Internal Medicine

## 2021-12-23 ENCOUNTER — Ambulatory Visit (INDEPENDENT_AMBULATORY_CARE_PROVIDER_SITE_OTHER): Payer: Medicare Other | Admitting: Gastroenterology

## 2022-03-25 ENCOUNTER — Ambulatory Visit: Payer: Medicare Other | Attending: Cardiology | Admitting: Cardiology

## 2022-03-25 ENCOUNTER — Encounter: Payer: Self-pay | Admitting: Cardiology

## 2022-03-25 VITALS — BP 136/80 | HR 74 | Ht <= 58 in | Wt 141.6 lb

## 2022-03-25 DIAGNOSIS — I4711 Inappropriate sinus tachycardia, so stated: Secondary | ICD-10-CM | POA: Diagnosis present

## 2022-03-25 DIAGNOSIS — I10 Essential (primary) hypertension: Secondary | ICD-10-CM | POA: Diagnosis present

## 2022-03-25 DIAGNOSIS — I700 Atherosclerosis of aorta: Secondary | ICD-10-CM | POA: Diagnosis present

## 2022-03-25 NOTE — Progress Notes (Signed)
    Cardiology Office Note  Date: 03/25/2022   ID: Caprina, Outten 1945-06-12, MRN [id number]  History of Present Illness: HIBA MAHDAVI is a 77 y.o. female last seen in January 2023.  She is here with her husband for follow-up visit.  Reports no palpitations or chest pain, no unusual shortness of breath with typical activities.  Main limitation is related to chronic arthritic pain.  She has had no dizziness or syncope.  I reviewed her medications, she continues on Lopressor 50 mg twice daily.  I reviewed her ECG today which shows sinus rhythm with short PR interval as before, no delta waves, chronic ST-T wave abnormalities which are stable as well.  Lab work from June is noted below.  LDL 101, she declines statin therapy.  Renal function normal on Aldactone.  Physical Exam: VS:  BP 136/80   Pulse 74   Ht '4\' 8"'$  (1.422 m)   Wt 141 lb 9.6 oz (64.2 kg)   SpO2 97%   BMI 31.75 kg/m , BMI Body mass index is 31.75 kg/m.  Wt Readings from Last 3 Encounters:  03/25/22 141 lb 9.6 oz (64.2 kg)  11/29/21 140 lb 9.6 oz (63.8 kg)  06/15/21 139 lb 11.2 oz (63.4 kg)    General: Patient appears comfortable at rest. HEENT: Conjunctiva and lids normal. Neck: Supple, no elevated JVP or carotid bruits. Lungs: Clear to auscultation, nonlabored breathing at rest. Cardiac: Regular rate and rhythm, no S3 or significant systolic murmur.  ECG:  An ECG dated 02/08/2021 was personally reviewed today and demonstrated:  Sinus rhythm with short PR interval, no delta waves, chronic ST-T wave abnormalities.  Labwork: 04/08/2021: ALT 19; AST 24; BUN 7; Creat 0.72; Hemoglobin 16.4; Platelets 415; Potassium 3.6; Sodium 138  June 2023: Cholesterol 171, triglycerides 134, HDL 46, LDL 101, hemoglobin A1c 5.7% November 2023: Hemoglobin 15.1, platelets 391, BUN 7, creatinine 0.92, potassium 3.4, AST 18, ALT 11  Other Studies Reviewed Today:  No interval cardiac testing for review today.  Assessment and  Plan:  1.  Inappropriate sinus tachycardia, continues on metoprolol.  She is asymptomatic at this time.  ECG reviewed and stable.  No changes were made today.  2.  Aortic atherosclerosis by CT imaging.  Statin therapy recommended, particularly with LDL 101, however she declines.  3.  Essential hypertension.  Continues to do well on Aldactone.  Renal function normal by lab work in June 2023.  Disposition:  Follow up  1 year.  Signed, Satira Sark, M.D., F.A.C.C.

## 2022-03-25 NOTE — Patient Instructions (Addendum)
Medication Instructions:  Your physician recommends that you continue on your current medications as directed. Please refer to the Current Medication list given to you today.  Labwork: none  Testing/Procedures: none  Follow-Up: Your physician recommends that you schedule a follow-up appointment in: 1 year. You will receive a reminder call in the mail in about 10 months reminding you to call and schedule your appointment. If you don't receive this call, please contact our office.  Any Other Special Instructions Will Be Listed Below (If Applicable).  If you need a refill on your cardiac medications before your next appointment, please call your pharmacy. 

## 2022-05-26 ENCOUNTER — Other Ambulatory Visit: Payer: Self-pay | Admitting: Cardiology

## 2022-12-16 ENCOUNTER — Encounter (INDEPENDENT_AMBULATORY_CARE_PROVIDER_SITE_OTHER): Payer: Self-pay | Admitting: *Deleted

## 2023-03-01 ENCOUNTER — Encounter (INDEPENDENT_AMBULATORY_CARE_PROVIDER_SITE_OTHER): Payer: Self-pay | Admitting: *Deleted

## 2023-03-21 ENCOUNTER — Telehealth: Payer: Self-pay | Admitting: Gastroenterology

## 2023-03-28 ENCOUNTER — Telehealth: Payer: Self-pay | Admitting: Internal Medicine

## 2023-03-28 ENCOUNTER — Telehealth (INDEPENDENT_AMBULATORY_CARE_PROVIDER_SITE_OTHER): Payer: Self-pay | Admitting: Internal Medicine

## 2023-03-28 NOTE — Telephone Encounter (Signed)
 Patient has left 2 messages asking about referral sent from Dr. Olena Leatherwood.  She said she was told she would receive a questionnaire but she says that she has never received it.

## 2023-03-28 NOTE — Telephone Encounter (Signed)
 Will you accept this patient as one of yours? She is wanting to switch providers. Please advise.

## 2023-03-28 NOTE — Telephone Encounter (Signed)
 Letter mailed again - left message for patient advising was mailing letter again

## 2023-04-07 NOTE — Telephone Encounter (Signed)
 Sure

## 2023-04-26 ENCOUNTER — Ambulatory Visit (INDEPENDENT_AMBULATORY_CARE_PROVIDER_SITE_OTHER): Admitting: Gastroenterology

## 2023-04-26 VITALS — BP 136/72 | HR 82 | Temp 97.3°F | Ht <= 58 in | Wt 135.9 lb

## 2023-04-26 DIAGNOSIS — K921 Melena: Secondary | ICD-10-CM | POA: Diagnosis not present

## 2023-04-26 DIAGNOSIS — K582 Mixed irritable bowel syndrome: Secondary | ICD-10-CM | POA: Diagnosis not present

## 2023-04-26 DIAGNOSIS — Z860100 Personal history of colon polyps, unspecified: Secondary | ICD-10-CM | POA: Diagnosis not present

## 2023-04-26 DIAGNOSIS — R110 Nausea: Secondary | ICD-10-CM

## 2023-04-26 DIAGNOSIS — K580 Irritable bowel syndrome with diarrhea: Secondary | ICD-10-CM

## 2023-04-26 DIAGNOSIS — K760 Fatty (change of) liver, not elsewhere classified: Secondary | ICD-10-CM

## 2023-04-26 DIAGNOSIS — K5904 Chronic idiopathic constipation: Secondary | ICD-10-CM | POA: Insufficient documentation

## 2023-04-26 DIAGNOSIS — K219 Gastro-esophageal reflux disease without esophagitis: Secondary | ICD-10-CM

## 2023-04-26 MED ORDER — PSYLLIUM 58.6 % PO PACK: 1.0000 | PACK | Freq: Two times a day (BID) | ORAL | 2 refills | Status: AC

## 2023-04-26 MED ORDER — POLYETHYLENE GLYCOL 3350 17 G PO PACK: 17.0000 g | PACK | Freq: Two times a day (BID) | ORAL | 0 refills | Status: AC

## 2023-04-26 MED ORDER — PEG 3350-KCL-NA BICARB-NACL 420 G PO SOLR: 4000.0000 mL | Freq: Once | ORAL | 0 refills | Status: AC

## 2023-04-26 NOTE — H&P (View-Only) (Signed)
 Kathryn Meyers , M.D. Gastroenterology & Hepatology [facility] Gastroenterology [address] [place], [place] [address] Primary Care Physician: Toma Deiters, MD [address] [place] [address]  Chief Complaint:  Nausea , hematochezia, surveillance colonoscopy with large colon polyp burden  History of Present Illness:  Kathryn Meyers is a 78 y.o. female  with past medical history of IBS-Mixed, GERD, choledocholithiasis status post sphincterotomy and stone removal, who presents with complaints of hematochezia, surveillance colonoscopy and nausea  Patient is accompanying with husband in the clinic .  Patient reports diffuse abdominal discomfort couple times a week with altered bowel movements ranging from [facility] stool scale 4-6.  She has been noticing fresh blood upon wiping and also dripping into any bowel for past couple months.  Patient has not been taking anything for IBS, takes pantoprazole intermittently.  Patient continues to have nausea most mornings no aggravating or relieving factor  Underwent a CT abdomen and pelvis with IV contrast on 04/2021 at outside facility which showed fatty liver without any other abnormalities.   The patient denies having any  fever, chills, hematochezia, melena, hematemesis, abdominal distention,  jaundice, pruritus or weight loss.  Labs from 12/2022 hemoglobin A1c 6.0 AST 19 ALT 15 alk phos 80 T. bili 0.7 Last EGD: 06/30/2017  The Z-line was regular and was found 34 cm from the incisors. A 2 cm hiatal hernia was present. Patchy mild inflammation characterized by erythema and granularity was found in the gastric antrum. The exam was otherwise without abnormality. Patchy mild inflammation characterized by congestion (edema) and erythema was found in the duodenal bulb.   Last Colonoscopy: 12/27/2017, small polyp in the cecum resected with cold forceps, 3 polyps in the mid transverse colon resected with a cold  snare, 3 polyps in the splenic flexure presenting with cold snare, diverticulosis.  All polyps were less than 1 cm.  External and internal hemorrhoids.  Pathology showed multiple fragments of tubular adenomas.   Recommended to repeat in December 2024  Daughter had colon cancer at age 33  Past Medical History: Past Medical History:  Diagnosis Date   Acid reflux    Anxiety    Arthritis    Colitis    Glaucoma    Gout    History of skin cancer    Inappropriate sinus tachycardia    Nail patellar syndrome    Abnormal nails of the hands usually the thumb, absent or hypertrophic patella, abnormal elbows, autosomal dominant, must be very careful with renal function   Shingles     Past Surgical History: Past Surgical History:  Procedure Laterality Date   BILIARY STENT PLACEMENT N/A 03/10/2017   Procedure: BILIARY STENT PLACEMENT;  Surgeon: Malissa Hippo, MD;  [facility];  Service: Gastroenterology;  Laterality: N/A;   BIOPSY  06/30/2017   Procedure: BIOPSY AMPULA;  Surgeon: Malissa Hippo, MD;  [facility];  Service: Gastroenterology;;   CATARACT EXTRACTION Bilateral    CESAREAN SECTION     x 4   CHOLECYSTECTOMY  1982   cholecystitis   COLONOSCOPY N/A 06/19/2014   Procedure: COLONOSCOPY;  Surgeon: Malissa Hippo, MD;  [facility];  Service: Endoscopy;  Laterality: N/A;  730 - moved to 5/26 @ 12:00   COLONOSCOPY N/A 09/26/2014   Procedure: COLONOSCOPY;  Surgeon: Malissa Hippo, MD;  [facility];  Service: Endoscopy;  Laterality: N/A;  1020 - moved to 11:15 - [name] to notify  pt   COLONOSCOPY N/A 12/27/2017   Procedure: COLONOSCOPY;  Surgeon: Malissa Hippo, MD;  [facility];  Service: Endoscopy;  Laterality: N/A;  12:00   ERCP N/A 03/10/2017   Procedure: ENDOSCOPIC RETROGRADE CHOLANGIOPANCREATOGRAPHY (ERCP) With sphincterotomy and stone extraction.;  Surgeon: Malissa Hippo, MD;  [facility];  Service:  Gastroenterology;  Laterality: N/A;   ERCP N/A 06/30/2017   Procedure: ENDOSCOPIC RETROGRADE CHOLANGIOPANCREATOGRAPHY (ERCP);  Surgeon: Malissa Hippo, MD;  [facility];  Service: Endoscopy;  Laterality: N/A;  pt knows to arrive at 10:20   ESOPHAGOGASTRODUODENOSCOPY ENDOSCOPY  06/30/2017   Procedure: ESOPHAGOGASTRODUODENOSCOPY ENDOSCOPY;  Surgeon: Malissa Hippo, MD;  [facility];  Service: Endoscopy;;   GASTROINTESTINAL STENT REMOVAL N/A 06/30/2017   Procedure: GASTROINTESTINAL STENT REMOVAL;  Surgeon: Malissa Hippo, MD;  [facility];  Service: Endoscopy;  Laterality: N/A;   Growth in ear removed     1994   KNEE SURGERY Right    POLYPECTOMY  12/27/2017   Procedure: POLYPECTOMY;  Surgeon: Malissa Hippo, MD;  [facility];  Service: Endoscopy;;  polyps at splenic flexure x3, cecal polyp, transverse polyps x3   REMOVAL OF STONES  06/30/2017   Procedure: REMOVAL OF STONES FRAGMENTS ;  Surgeon: Malissa Hippo, MD;  [facility];  Service: Gastroenterology;;   rt knee replacement     2017   SPHINCTEROTOMY N/A 03/10/2017   Procedure: SPHINCTEROTOMY;  Surgeon: Malissa Hippo, MD;  [facility];  Service: Gastroenterology;  Laterality: N/A;   stent eye Left    STONE EXTRACTION WITH BASKET N/A 03/10/2017   Procedure: STONE EXTRACTION WITH BASKET;  Surgeon: Malissa Hippo, MD;  [facility];  Service: Gastroenterology;  Laterality: N/A;   TONSILLECTOMY     age 51    Family History: Family History  Problem Relation Age of Onset   Dementia Mother    Alcoholism Father     Social History: Social History   Tobacco Use  Smoking Status Every Day   Current packs/day: 1.00   Average packs/day: 1 pack/day for 46.1 years (46.1 ttl pk-yrs)   Types: Cigarettes   Start date: 03/21/1977   Passive exposure: Current  Smokeless Tobacco Never  Tobacco Comments   smokes 1 pack per day 11/27/2019   Social History    Substance and Sexual Activity  Alcohol Use No   Alcohol/week: 0.0 standard drinks of alcohol   Social History   Substance and Sexual Activity  Drug Use No    Allergies: Allergies  Allergen Reactions   Nsaids Other (See Comments)    Rectal bleeding.   Cortisone Other (See Comments)    Dizzy/flush/rapid heart beat.   Demerol [Meperidine] Nausea And Vomiting    Medications: Current Outpatient Medications  Medication Sig Dispense Refill   acetaminophen (TYLENOL) 500 MG tablet Take 1,000 mg by mouth daily as needed for moderate pain.      albuterol (VENTOLIN HFA) 108 (90 Base) MCG/ACT inhaler SMARTSIG:1 Puff(s) Via Inhaler Every 4 Hours PRN     ANORO ELLIPTA 62.5-25 MCG/INH AEPB Inhale 1 puff into the lungs daily.     cholecalciferol (VITAMIN D3) 25 MCG (1000 UNIT) tablet Take 1,000 Units by mouth daily.     Cyanocobalamin (VITAMIN B-12 IJ) Inject 1 Dose as directed every 30 (thirty) days.      diazepam (VALIUM) 10 MG tablet Take 2.5 mg by mouth at bedtime as needed for  sleep.     famotidine (PEPCID) 20 MG tablet Take 20 mg by mouth at bedtime as needed for heartburn or indigestion. As needed     hydrocortisone (ANUSOL-HC) 25 MG suppository Place 25 mg rectally. As needed     metoprolol tartrate (LOPRESSOR) 50 MG tablet TAKE 1 TABLET BY MOUTH TWICE A DAY 180 tablet 3   ondansetron (ZOFRAN) 4 MG tablet Take 1 tablet (4 mg total) by mouth 2 (two) times daily as needed for nausea or vomiting. 20 tablet 0   oxyCODONE (OXY IR/ROXICODONE) 5 MG immediate release tablet Take 5 mg by mouth daily as needed. 2.5 mg prn     pantoprazole (PROTONIX) 40 MG tablet TAKE 1 TABLET BY MOUTH DAILY BEFORE BREAKFAST (Patient taking differently: Take 40 mg by mouth as needed.) 90 tablet 3   spironolactone (ALDACTONE) 25 MG tablet Take 25 mg by mouth daily.      No current facility-administered medications for this visit.    Review of Systems: GENERAL: negative for malaise, night sweats HEENT: No  changes in hearing or vision, no nose bleeds or other nasal problems. NECK: Negative for lumps, goiter, pain and significant neck swelling RESPIRATORY: Negative for cough, wheezing CARDIOVASCULAR: Negative for chest pain, leg swelling, palpitations, orthopnea GI: SEE HPI MUSCULOSKELETAL: Negative for joint pain or swelling, back pain, and muscle pain. SKIN: Negative for lesions, rash HEMATOLOGY Negative for prolonged bleeding, bruising easily, and swollen nodes. ENDOCRINE: Negative for cold or heat intolerance, polyuria, polydipsia and goiter. NEURO: negative for tremor, gait imbalance, syncope and seizures. The remainder of the review of systems is noncontributory.   Physical Exam: Ht 4\' 8"  (1.422 m)   Wt 135 lb 14.4 oz (61.6 kg)   BMI 30.47 kg/m  GENERAL: The patient is AO x3, in no acute distress. HEENT: Head is normocephalic and atraumatic. EOMI are intact. Mouth is well hydrated and without lesions. NECK: Supple. No masses LUNGS: Clear to auscultation. No presence of rhonchi/wheezing/rales. Adequate chest expansion HEART: RRR, normal s1 and s2. ABDOMEN: Soft, nontender, no guarding, no peritoneal signs, and nondistended. BS +. No masses.  Imaging/Labs: as above     Latest Ref Rng & Units 04/08/2021   10:55 AM 10/24/2018   11:56 AM 06/23/2017   11:31 AM  CBC  WBC 3.8 - 10.8 Thousand/uL 12.8  13.5  10.9   Hemoglobin 11.7 - 15.5 g/dL 19.1  47.8  29.5   Hematocrit 35.0 - 45.0 % 49.0  45.7  46.2   Platelets 140 - 400 Thousand/uL 415  431  375    No results found for: "IRON", "TIBC", "FERRITIN"  I personally reviewed and interpreted the available labs, imaging and endoscopic files.  Impression and Plan: JAELEN GELLERMAN is a 78 y.o. female  with past medical history of IBS-Mixed, GERD, choledocholithiasis status post sphincterotomy and stone removal, who presents with complaints of hematochezia, surveillance colonoscopy and nausea  #IBS-M #Nausea #Painless Hematochezia #  Surveillance colonoscopy   Patient has diffuse abdominal discomfort with altered bowel movements.  This is likely IBS mixed type.  Not taking anything for IBS  Patient continues to be nauseous in the morning and has been going on since at least 2023  Painless hematochezia could be hemorrhoidal bleed  Given persistent nausea and patient about the age of 64 diagnostic upper endoscopy is indicated  Will start patient on IBgard and Metamucil twice daily for IBS  Given history of large colon polyp burden reports at 1 time and 23 polyps.  Last  colonoscopy 2019 suggest repeat 5 years and hence she is overdue.  Will schedule surveillance colonoscopy as well  All questions were answered.      Kathryn Lawman, MD Gastroenterology and Hepatology [facility] Gastroenterology   This chart has been completed using [facility] Dictation software, and while attempts have been made to ensure accuracy , certain words and phrases may not be transcribed as intended

## 2023-04-26 NOTE — Patient Instructions (Addendum)
 It was very nice to meet you today, as dicussed with will plan for the following :  1) Ensure adequate fluid intake: Aim for 8 glasses of water daily. Follow a high fiber diet: Include foods such as dates, prunes, pears, and kiwi. Take Miralax twice a day for the first week, then reduce to once daily thereafter. Use Metamucil twice a day.  2) IB Guard 1-2 times daily  3) Protonix 30 min before breakfast  4) Upper endoscopy and colonoscopy

## 2023-04-26 NOTE — Progress Notes (Signed)
 Kathryn Meyers , M.D. Gastroenterology & Hepatology [facility] Gastroenterology [address] [place], [place] [address] Primary Care Physician: Toma Deiters, MD [address] [place] [address]  Chief Complaint:  Nausea , hematochezia, surveillance colonoscopy with large colon polyp burden  History of Present Illness:  Kathryn Meyers is a 78 y.o. female  with past medical history of IBS-Mixed, GERD, choledocholithiasis status post sphincterotomy and stone removal, who presents with complaints of hematochezia, surveillance colonoscopy and nausea  Patient is accompanying with husband in the clinic .  Patient reports diffuse abdominal discomfort couple times a week with altered bowel movements ranging from [facility] stool scale 4-6.  She has been noticing fresh blood upon wiping and also dripping into any bowel for past couple months.  Patient has not been taking anything for IBS, takes pantoprazole intermittently.  Patient continues to have nausea most mornings no aggravating or relieving factor  Underwent a CT abdomen and pelvis with IV contrast on 04/2021 at outside facility which showed fatty liver without any other abnormalities.   The patient denies having any  fever, chills, hematochezia, melena, hematemesis, abdominal distention,  jaundice, pruritus or weight loss.  Labs from 12/2022 hemoglobin A1c 6.0 AST 19 ALT 15 alk phos 80 T. bili 0.7 Last EGD: 06/30/2017  The Z-line was regular and was found 34 cm from the incisors. A 2 cm hiatal hernia was present. Patchy mild inflammation characterized by erythema and granularity was found in the gastric antrum. The exam was otherwise without abnormality. Patchy mild inflammation characterized by congestion (edema) and erythema was found in the duodenal bulb.   Last Colonoscopy: 12/27/2017, small polyp in the cecum resected with cold forceps, 3 polyps in the mid transverse colon resected with a cold  snare, 3 polyps in the splenic flexure presenting with cold snare, diverticulosis.  All polyps were less than 1 cm.  External and internal hemorrhoids.  Pathology showed multiple fragments of tubular adenomas.   Recommended to repeat in December 2024  Daughter had colon cancer at age 33  Past Medical History: Past Medical History:  Diagnosis Date   Acid reflux    Anxiety    Arthritis    Colitis    Glaucoma    Gout    History of skin cancer    Inappropriate sinus tachycardia    Nail patellar syndrome    Abnormal nails of the hands usually the thumb, absent or hypertrophic patella, abnormal elbows, autosomal dominant, must be very careful with renal function   Shingles     Past Surgical History: Past Surgical History:  Procedure Laterality Date   BILIARY STENT PLACEMENT N/A 03/10/2017   Procedure: BILIARY STENT PLACEMENT;  Surgeon: Malissa Hippo, MD;  [facility];  Service: Gastroenterology;  Laterality: N/A;   BIOPSY  06/30/2017   Procedure: BIOPSY AMPULA;  Surgeon: Malissa Hippo, MD;  [facility];  Service: Gastroenterology;;   CATARACT EXTRACTION Bilateral    CESAREAN SECTION     x 4   CHOLECYSTECTOMY  1982   cholecystitis   COLONOSCOPY N/A 06/19/2014   Procedure: COLONOSCOPY;  Surgeon: Malissa Hippo, MD;  [facility];  Service: Endoscopy;  Laterality: N/A;  730 - moved to 5/26 @ 12:00   COLONOSCOPY N/A 09/26/2014   Procedure: COLONOSCOPY;  Surgeon: Malissa Hippo, MD;  [facility];  Service: Endoscopy;  Laterality: N/A;  1020 - moved to 11:15 - [name] to notify  pt   COLONOSCOPY N/A 12/27/2017   Procedure: COLONOSCOPY;  Surgeon: Malissa Hippo, MD;  [facility];  Service: Endoscopy;  Laterality: N/A;  12:00   ERCP N/A 03/10/2017   Procedure: ENDOSCOPIC RETROGRADE CHOLANGIOPANCREATOGRAPHY (ERCP) With sphincterotomy and stone extraction.;  Surgeon: Malissa Hippo, MD;  [facility];  Service:  Gastroenterology;  Laterality: N/A;   ERCP N/A 06/30/2017   Procedure: ENDOSCOPIC RETROGRADE CHOLANGIOPANCREATOGRAPHY (ERCP);  Surgeon: Malissa Hippo, MD;  [facility];  Service: Endoscopy;  Laterality: N/A;  pt knows to arrive at 10:20   ESOPHAGOGASTRODUODENOSCOPY ENDOSCOPY  06/30/2017   Procedure: ESOPHAGOGASTRODUODENOSCOPY ENDOSCOPY;  Surgeon: Malissa Hippo, MD;  [facility];  Service: Endoscopy;;   GASTROINTESTINAL STENT REMOVAL N/A 06/30/2017   Procedure: GASTROINTESTINAL STENT REMOVAL;  Surgeon: Malissa Hippo, MD;  [facility];  Service: Endoscopy;  Laterality: N/A;   Growth in ear removed     1994   KNEE SURGERY Right    POLYPECTOMY  12/27/2017   Procedure: POLYPECTOMY;  Surgeon: Malissa Hippo, MD;  [facility];  Service: Endoscopy;;  polyps at splenic flexure x3, cecal polyp, transverse polyps x3   REMOVAL OF STONES  06/30/2017   Procedure: REMOVAL OF STONES FRAGMENTS ;  Surgeon: Malissa Hippo, MD;  [facility];  Service: Gastroenterology;;   rt knee replacement     2017   SPHINCTEROTOMY N/A 03/10/2017   Procedure: SPHINCTEROTOMY;  Surgeon: Malissa Hippo, MD;  [facility];  Service: Gastroenterology;  Laterality: N/A;   stent eye Left    STONE EXTRACTION WITH BASKET N/A 03/10/2017   Procedure: STONE EXTRACTION WITH BASKET;  Surgeon: Malissa Hippo, MD;  [facility];  Service: Gastroenterology;  Laterality: N/A;   TONSILLECTOMY     age 51    Family History: Family History  Problem Relation Age of Onset   Dementia Mother    Alcoholism Father     Social History: Social History   Tobacco Use  Smoking Status Every Day   Current packs/day: 1.00   Average packs/day: 1 pack/day for 46.1 years (46.1 ttl pk-yrs)   Types: Cigarettes   Start date: 03/21/1977   Passive exposure: Current  Smokeless Tobacco Never  Tobacco Comments   smokes 1 pack per day 11/27/2019   Social History    Substance and Sexual Activity  Alcohol Use No   Alcohol/week: 0.0 standard drinks of alcohol   Social History   Substance and Sexual Activity  Drug Use No    Allergies: Allergies  Allergen Reactions   Nsaids Other (See Comments)    Rectal bleeding.   Cortisone Other (See Comments)    Dizzy/flush/rapid heart beat.   Demerol [Meperidine] Nausea And Vomiting    Medications: Current Outpatient Medications  Medication Sig Dispense Refill   acetaminophen (TYLENOL) 500 MG tablet Take 1,000 mg by mouth daily as needed for moderate pain.      albuterol (VENTOLIN HFA) 108 (90 Base) MCG/ACT inhaler SMARTSIG:1 Puff(s) Via Inhaler Every 4 Hours PRN     ANORO ELLIPTA 62.5-25 MCG/INH AEPB Inhale 1 puff into the lungs daily.     cholecalciferol (VITAMIN D3) 25 MCG (1000 UNIT) tablet Take 1,000 Units by mouth daily.     Cyanocobalamin (VITAMIN B-12 IJ) Inject 1 Dose as directed every 30 (thirty) days.      diazepam (VALIUM) 10 MG tablet Take 2.5 mg by mouth at bedtime as needed for  sleep.     famotidine (PEPCID) 20 MG tablet Take 20 mg by mouth at bedtime as needed for heartburn or indigestion. As needed     hydrocortisone (ANUSOL-HC) 25 MG suppository Place 25 mg rectally. As needed     metoprolol tartrate (LOPRESSOR) 50 MG tablet TAKE 1 TABLET BY MOUTH TWICE A DAY 180 tablet 3   ondansetron (ZOFRAN) 4 MG tablet Take 1 tablet (4 mg total) by mouth 2 (two) times daily as needed for nausea or vomiting. 20 tablet 0   oxyCODONE (OXY IR/ROXICODONE) 5 MG immediate release tablet Take 5 mg by mouth daily as needed. 2.5 mg prn     pantoprazole (PROTONIX) 40 MG tablet TAKE 1 TABLET BY MOUTH DAILY BEFORE BREAKFAST (Patient taking differently: Take 40 mg by mouth as needed.) 90 tablet 3   spironolactone (ALDACTONE) 25 MG tablet Take 25 mg by mouth daily.      No current facility-administered medications for this visit.    Review of Systems: GENERAL: negative for malaise, night sweats HEENT: No  changes in hearing or vision, no nose bleeds or other nasal problems. NECK: Negative for lumps, goiter, pain and significant neck swelling RESPIRATORY: Negative for cough, wheezing CARDIOVASCULAR: Negative for chest pain, leg swelling, palpitations, orthopnea GI: SEE HPI MUSCULOSKELETAL: Negative for joint pain or swelling, back pain, and muscle pain. SKIN: Negative for lesions, rash HEMATOLOGY Negative for prolonged bleeding, bruising easily, and swollen nodes. ENDOCRINE: Negative for cold or heat intolerance, polyuria, polydipsia and goiter. NEURO: negative for tremor, gait imbalance, syncope and seizures. The remainder of the review of systems is noncontributory.   Physical Exam: Ht 4\' 8"  (1.422 m)   Wt 135 lb 14.4 oz (61.6 kg)   BMI 30.47 kg/m  GENERAL: The patient is AO x3, in no acute distress. HEENT: Head is normocephalic and atraumatic. EOMI are intact. Mouth is well hydrated and without lesions. NECK: Supple. No masses LUNGS: Clear to auscultation. No presence of rhonchi/wheezing/rales. Adequate chest expansion HEART: RRR, normal s1 and s2. ABDOMEN: Soft, nontender, no guarding, no peritoneal signs, and nondistended. BS +. No masses.  Imaging/Labs: as above     Latest Ref Rng & Units 04/08/2021   10:55 AM 10/24/2018   11:56 AM 06/23/2017   11:31 AM  CBC  WBC 3.8 - 10.8 Thousand/uL 12.8  13.5  10.9   Hemoglobin 11.7 - 15.5 g/dL 19.1  47.8  29.5   Hematocrit 35.0 - 45.0 % 49.0  45.7  46.2   Platelets 140 - 400 Thousand/uL 415  431  375    No results found for: "IRON", "TIBC", "FERRITIN"  I personally reviewed and interpreted the available labs, imaging and endoscopic files.  Impression and Plan: Kathryn Meyers is a 78 y.o. female  with past medical history of IBS-Mixed, GERD, choledocholithiasis status post sphincterotomy and stone removal, who presents with complaints of hematochezia, surveillance colonoscopy and nausea  #IBS-M #Nausea #Painless Hematochezia #  Surveillance colonoscopy   Patient has diffuse abdominal discomfort with altered bowel movements.  This is likely IBS mixed type.  Not taking anything for IBS  Patient continues to be nauseous in the morning and has been going on since at least 2023  Painless hematochezia could be hemorrhoidal bleed  Given persistent nausea and patient about the age of 64 diagnostic upper endoscopy is indicated  Will start patient on IBgard and Metamucil twice daily for IBS  Given history of large colon polyp burden reports at 1 time and 23 polyps.  Last  colonoscopy 2019 suggest repeat 5 years and hence she is overdue.  Will schedule surveillance colonoscopy as well  All questions were answered.      Kathryn Lawman, MD Gastroenterology and Hepatology [facility] Gastroenterology   This chart has been completed using [facility] Dictation software, and while attempts have been made to ensure accuracy , certain words and phrases may not be transcribed as intended

## 2023-05-22 ENCOUNTER — Ambulatory Visit (HOSPITAL_COMMUNITY): Admitting: Anesthesiology

## 2023-05-22 ENCOUNTER — Ambulatory Visit (HOSPITAL_BASED_OUTPATIENT_CLINIC_OR_DEPARTMENT_OTHER): Admitting: Anesthesiology

## 2023-05-22 ENCOUNTER — Ambulatory Visit (HOSPITAL_COMMUNITY)
Admission: RE | Admit: 2023-05-22 | Discharge: 2023-05-22 | Disposition: A | Discharge status: Home / Self Care | Attending: Gastroenterology | Admitting: Gastroenterology

## 2023-05-22 ENCOUNTER — Encounter (HOSPITAL_COMMUNITY)
Admission: RE | Disposition: A | Discharge status: Home / Self Care | Payer: Self-pay | Source: Home / Self Care | Attending: Gastroenterology

## 2023-05-22 ENCOUNTER — Other Ambulatory Visit: Payer: Self-pay

## 2023-05-22 ENCOUNTER — Encounter (INDEPENDENT_AMBULATORY_CARE_PROVIDER_SITE_OTHER): Payer: Self-pay | Admitting: *Deleted

## 2023-05-22 ENCOUNTER — Encounter (HOSPITAL_COMMUNITY): Payer: Self-pay | Admitting: Gastroenterology

## 2023-05-22 DIAGNOSIS — K648 Other hemorrhoids: Secondary | ICD-10-CM | POA: Insufficient documentation

## 2023-05-22 DIAGNOSIS — K2970 Gastritis, unspecified, without bleeding: Secondary | ICD-10-CM

## 2023-05-22 DIAGNOSIS — K5730 Diverticulosis of large intestine without perforation or abscess without bleeding: Secondary | ICD-10-CM

## 2023-05-22 DIAGNOSIS — D124 Benign neoplasm of descending colon: Secondary | ICD-10-CM | POA: Diagnosis not present

## 2023-05-22 DIAGNOSIS — F17210 Nicotine dependence, cigarettes, uncomplicated: Secondary | ICD-10-CM | POA: Insufficient documentation

## 2023-05-22 DIAGNOSIS — K2950 Unspecified chronic gastritis without bleeding: Secondary | ICD-10-CM | POA: Diagnosis not present

## 2023-05-22 DIAGNOSIS — K3189 Other diseases of stomach and duodenum: Secondary | ICD-10-CM

## 2023-05-22 DIAGNOSIS — M1990 Unspecified osteoarthritis, unspecified site: Secondary | ICD-10-CM | POA: Diagnosis not present

## 2023-05-22 DIAGNOSIS — K219 Gastro-esophageal reflux disease without esophagitis: Secondary | ICD-10-CM | POA: Insufficient documentation

## 2023-05-22 DIAGNOSIS — D120 Benign neoplasm of cecum: Secondary | ICD-10-CM | POA: Diagnosis not present

## 2023-05-22 DIAGNOSIS — D123 Benign neoplasm of transverse colon: Secondary | ICD-10-CM | POA: Diagnosis not present

## 2023-05-22 DIAGNOSIS — K635 Polyp of colon: Secondary | ICD-10-CM

## 2023-05-22 DIAGNOSIS — D122 Benign neoplasm of ascending colon: Secondary | ICD-10-CM

## 2023-05-22 DIAGNOSIS — Z860101 Personal history of adenomatous and serrated colon polyps: Secondary | ICD-10-CM

## 2023-05-22 DIAGNOSIS — Z1211 Encounter for screening for malignant neoplasm of colon: Secondary | ICD-10-CM | POA: Insufficient documentation

## 2023-05-22 DIAGNOSIS — Z860100 Personal history of colon polyps, unspecified: Secondary | ICD-10-CM

## 2023-05-22 HISTORY — PX: ESOPHAGOGASTRODUODENOSCOPY: SHX5428

## 2023-05-22 HISTORY — PX: COLONOSCOPY: SHX5424

## 2023-05-22 LAB — HM COLONOSCOPY

## 2023-05-22 SURGERY — COLONOSCOPY
Anesthesia: General

## 2023-05-22 MED ORDER — GLYCOPYRROLATE PF 0.2 MG/ML IJ SOSY
PREFILLED_SYRINGE | INTRAMUSCULAR | Status: DC | PRN
  Administered 2023-05-22: .2 mg via INTRAVENOUS

## 2023-05-22 MED ORDER — EPHEDRINE SULFATE-NACL 50-0.9 MG/10ML-% IV SOSY
PREFILLED_SYRINGE | INTRAVENOUS | Status: DC | PRN
Start: 2023-05-22 — End: 2023-05-22
  Administered 2023-05-22: 5 mg via INTRAVENOUS

## 2023-05-22 MED ORDER — PHENYLEPHRINE 80 MCG/ML (10ML) SYRINGE FOR IV PUSH (FOR BLOOD PRESSURE SUPPORT)
PREFILLED_SYRINGE | INTRAVENOUS | Status: DC | PRN
Start: 2023-05-22 — End: 2023-05-22
  Administered 2023-05-22 (×3): 160 ug via INTRAVENOUS
  Administered 2023-05-22: 80 ug via INTRAVENOUS
  Administered 2023-05-22: 160 ug via INTRAVENOUS
  Administered 2023-05-22: 80 ug via INTRAVENOUS

## 2023-05-22 MED ORDER — LACTATED RINGERS IV SOLN: INTRAVENOUS | Status: DC | PRN

## 2023-05-22 MED ORDER — SODIUM CHLORIDE 0.9% FLUSH: 3.0000 mL | INTRAVENOUS | Status: DC | PRN

## 2023-05-22 MED ORDER — SODIUM CHLORIDE 0.9% FLUSH: 3.0000 mL | Freq: Two times a day (BID) | INTRAVENOUS | Status: DC

## 2023-05-22 MED ORDER — LIDOCAINE 2% (20 MG/ML) 5 ML SYRINGE
INTRAMUSCULAR | Status: DC | PRN
  Administered 2023-05-22: 100 mg via INTRAVENOUS

## 2023-05-22 MED ORDER — PROPOFOL 500 MG/50ML IV EMUL
INTRAVENOUS | Status: DC | PRN
  Administered 2023-05-22: 150 ug/kg/min via INTRAVENOUS

## 2023-05-22 MED ORDER — PROPOFOL 10 MG/ML IV BOLUS
INTRAVENOUS | Status: DC | PRN
  Administered 2023-05-22: 100 mg via INTRAVENOUS

## 2023-05-22 MED ORDER — GLYCOPYRROLATE PF 0.2 MG/ML IJ SOSY
PREFILLED_SYRINGE | INTRAMUSCULAR | Status: AC
  Filled 2023-05-22: qty 1

## 2023-05-22 NOTE — Transfer of Care (Signed)
 Immediate Anesthesia Transfer of Care Note  Patient: Kathryn Meyers  Procedure(s) Performed: COLONOSCOPY EGD (ESOPHAGOGASTRODUODENOSCOPY)  Patient Location: Endoscopy Unit  Anesthesia Type:General  Level of Consciousness: drowsy  Airway & Oxygen Therapy: Patient Spontanous Breathing  Post-op Assessment: Report given to RN and Post -op Vital signs reviewed and stable  Post vital signs: Reviewed and stable  Last Vitals:  Vitals Value Taken Time  BP    Temp    Pulse    Resp    SpO2      Last Pain:  Vitals:   05/22/23 0846  TempSrc:   PainSc: 0-No pain         Complications: No notable events documented.

## 2023-05-22 NOTE — Interval H&P Note (Signed)
 History and Physical Interval Note:  05/22/2023 8:38 AM  Kathryn Meyers  has presented today for surgery, with the diagnosis of DYSPEPSIA,SURVEILLANCE.  The various methods of treatment have been discussed with the patient and family. After consideration of risks, benefits and other options for treatment, the patient has consented to  Procedure(s) with comments: COLONOSCOPY (N/A) - 9:15AM;ASA 1-2 EGD (ESOPHAGOGASTRODUODENOSCOPY) (N/A) - 9:15AM;ASA 1-2 as a surgical intervention.  The patient's history has been reviewed, patient examined, no change in status, stable for surgery.  I have reviewed the patient's chart and labs.  Questions were answered to the patient's satisfaction.     [name]

## 2023-05-22 NOTE — Op Note (Signed)
 [facility] Patient Name: Kathryn Meyers Procedure Date: 05/22/2023 8:40 AM MRN: [id number] Date of Birth: [birth year] Attending MD: Terril Fetters , MD, [id number] CSN: [id number] Age: 78 Admit Type: Outpatient Procedure:                Colonoscopy Indications:              High risk colon cancer surveillance: Personal                            history of colonic polyps, High risk colon cancer                            surveillance: Personal history of multiple (3 or                            more) adenomas Providers:                Terril Fetters, MD, Crystal Page, Kristine L. Roberta Chin, Technician Referring MD:              Medicines:                Monitored Anesthesia Care Complications:            No immediate complications. Estimated Blood Loss:     Estimated blood loss: none. Procedure:                Pre-Anesthesia Assessment:                           - Prior to the procedure, a History and Physical                            was performed, and patient medications and                            allergies were reviewed. The patient's tolerance of                            previous anesthesia was also reviewed. The risks                            and benefits of the procedure and the sedation                            options and risks were discussed with the patient.                            All questions were answered, and informed consent                            was obtained. Prior Anticoagulants: The patient has                            taken  no anticoagulant or antiplatelet agents. ASA                            Grade Assessment: II - A patient with mild systemic                            disease. After reviewing the risks and benefits,                            the patient was deemed in satisfactory condition to                            undergo the procedure.                           After obtaining informed consent, the  colonoscope                            was passed under direct vision. Throughout the                            procedure, the patient's blood pressure, pulse, and                            oxygen saturations were monitored continuously. The                            [id number]) scope was introduced through                            the anus and advanced to the the cecum, identified                            by appendiceal orifice and ileocecal valve. The                            colonoscopy was performed without difficulty. The                            patient tolerated the procedure well. The quality                            of the bowel preparation was evaluated using the                            BBPS [facility] Bowel Preparation Scale) with scores                            of: Right Colon = 3, Transverse Colon = 3 and Left                            Colon = 3 (entire mucosa seen well with no residual  staining, small fragments of stool or opaque                            liquid). The total BBPS score equals 9. The                            ileocecal valve, appendiceal orifice, and rectum                            were photographed. Scope In: 8:59:25 AM Scope Out: 9:26:33 AM Scope Withdrawal Time: 0 hours 23 minutes 31 seconds  Total Procedure Duration: 0 hours 27 minutes 8 seconds  Findings:      The perianal and digital rectal examinations were normal.      A 10 mm polyp was found in the cecum. The polyp was sessile.       Preparations were made for mucosal resection. Demarcation of the lesion       was performed with high-definition white light and narrow band imaging       to clearly identify the boundaries of the lesion. Eleview was injected       to raise the lesion. Snare mucosal resection was performed. Resection       and retrieval were complete. Resected tissue margins were examined and       clear of polyp tissue.      Seven  sessile polyps were found in the descending colon, transverse       colon and ascending colon. The polyps were 4 to 8 mm in size. These       polyps were removed with a cold snare. Resection and retrieval were       complete.      A few small-mouthed diverticula were found in the left colon. Impression:               - One 10 mm polyp in the cecum, removed with                            mucosal resection. Resected and retrieved.                           - Seven 4 to 8 mm polyps in the descending colon,                            in the transverse colon and in the ascending colon,                            removed with a cold snare. Resected and retrieved.                           - Diverticulosis in the left colon.                           - Mucosal resection was performed. Resection and                            retrieval were complete. Moderate Sedation:      Per Anesthesia Care  Recommendation:           - Patient has a contact number available for                            emergencies. The signs and symptoms of potential                            delayed complications were discussed with the                            patient. Return to normal activities tomorrow.                            Written discharge instructions were provided to the                            patient.                           - Resume previous diet.                           - Continue present medications.                           - Await pathology results.                           - Repeat colonoscopy in 3 years for surveillance.                           - Return to primary care physician as previously                            scheduled. Procedure Code(s):        --- Professional ---                           [phone], Colonoscopy, flexible; with endoscopic                            mucosal resection                           45385, 59, Colonoscopy, flexible; with removal of                             tumor(s), polyp(s), or other lesion(s) by snare                            technique Diagnosis Code(s):        --- Professional ---                           D12.0, Benign neoplasm of cecum  D12.4, Benign neoplasm of descending colon                           D12.3, Benign neoplasm of transverse colon (hepatic                            flexure or splenic flexure)                           D12.2, Benign neoplasm of ascending colon                           Z86.010, Personal history of colonic polyps                           K57.30, Diverticulosis of large intestine without                            perforation or abscess without bleeding CPT copyright 2022 American Medical Association. All rights reserved. The codes documented in this report are preliminary and upon coder review may  be revised to meet current compliance requirements. Terril Fetters, MD Terril Fetters, MD 05/22/2023 9:40:37 AM This report has been signed electronically. Number of Addenda: 0

## 2023-05-22 NOTE — Discharge Instructions (Signed)

## 2023-05-22 NOTE — Anesthesia Preprocedure Evaluation (Addendum)
 Anesthesia Evaluation  Patient identified by MRN, date of birth, ID band Patient awake    Reviewed: Allergy & Precautions, NPO status , Patient's Chart, lab work & pertinent test results  Airway Mallampati: II  TM Distance: <3 FB   Mouth opening: Limited Mouth Opening  Dental  (+) Teeth Intact, Poor Dentition, Implants, Dental Advisory Given Veneers 2 upper front incisors:   Pulmonary Current Smoker  recent diagnosis of influenza A  Prod cough  Pulmonary exam normal breath sounds clear to auscultation       Cardiovascular negative cardio ROS Normal cardiovascular exam Rhythm:Regular Rate:Tachycardia     Neuro/Psych   Anxiety     glaucoma  negative psych ROS   GI/Hepatic Neg liver ROS,GERD  ,,  Endo/Other  negative endocrine ROS    Renal/GU negative Renal ROS     Musculoskeletal  (+) Arthritis , Osteoarthritis,    Abdominal   Peds  Hematology   Anesthesia Other Findings H/O TMJ pain after last procedure  Reproductive/Obstetrics                             Anesthesia Physical Anesthesia Plan  ASA: 2  Anesthesia Plan: General   Post-op Pain Management: Minimal or no pain anticipated   Induction: Intravenous  PONV Risk Score and Plan: Propofol  infusion  Airway Management Planned: Nasal Cannula and Natural Airway  Additional Equipment: None  Intra-op Plan:   Post-operative Plan:   Informed Consent: I have reviewed the patients History and Physical, chart, labs and discussed the procedure including the risks, benefits and alternatives for the proposed anesthesia with the patient or authorized representative who has indicated his/her understanding and acceptance.     Dental advisory given  Plan Discussed with: CRNA  Anesthesia Plan Comments:         Anesthesia Quick Evaluation

## 2023-05-22 NOTE — Anesthesia Procedure Notes (Signed)
 Date/Time: 05/22/2023 8:45 AM  Performed by: Sherwin Donate, CRNAPre-anesthesia Checklist: Patient identified, Emergency Drugs available and Suction available Patient Re-evaluated:Patient Re-evaluated prior to induction Oxygen Delivery Method: Nasal cannula Induction Type: IV induction Placement Confirmation: positive ETCO2 Comments: Optiflow High Flow [place] O2 used.

## 2023-05-22 NOTE — Op Note (Addendum)
 [facility] Patient Name: Kathryn Meyers Procedure Date: 05/22/2023 8:40 AM MRN: [id number] Date of Birth: Apr 08, 1945 Attending MD: Terril Fetters , MD, [id number] CSN: [id number] Age: 78 Admit Type: Outpatient Procedure:                Upper GI endoscopy Indications:              Dyspepsia, Nausea Providers:                Terril Fetters, MD, Crystal Page, Kristine L. Roberta Chin, Technician Referring MD:              Medicines:                Monitored Anesthesia Care Complications:            No immediate complications. Estimated Blood Loss:     Estimated blood loss: none. Procedure:                Pre-Anesthesia Assessment:                           - Prior to the procedure, a History and Physical                            was performed, and patient medications and                            allergies were reviewed. The patient's tolerance of                            previous anesthesia was also reviewed. The risks                            and benefits of the procedure and the sedation                            options and risks were discussed with the patient.                            All questions were answered, and informed consent                            was obtained. Prior Anticoagulants: The patient has                            taken no anticoagulant or antiplatelet agents. ASA                            Grade Assessment: II - A patient with mild systemic                            disease. After reviewing the risks and benefits,  the patient was deemed in satisfactory condition to                            undergo the procedure.                           After obtaining informed consent, the endoscope was                            passed under direct vision. Throughout the                            procedure, the patient's blood pressure, pulse, and                            oxygen saturations were  monitored continuously. The                            [id number]-H190 ([id number]) scope was introduced through the                            mouth, and advanced to the second part of duodenum.                            The upper GI endoscopy was accomplished without                            difficulty. The patient tolerated the procedure                            well. Scope In: 8:50:54 AM Scope Out: 8:54:26 AM Total Procedure Duration: 0 hours 3 minutes 32 seconds  Findings:      The examined esophagus was normal.      Mild inflammation characterized by erythema was found in the stomach.       Biopsies were taken with a cold forceps for histology.      The duodenal bulb and second portion of the duodenum were normal. Impression:               - Normal esophagus.                           - Gastritis. Biopsied.                           - Normal duodenal bulb and second portion of the                            duodenum. Moderate Sedation:      Per Anesthesia Care Recommendation:           - Patient has a contact number available for                            emergencies. The signs and symptoms of potential  delayed complications were discussed with the                            patient. Return to normal activities tomorrow.                            Written discharge instructions were provided to the                            patient.                           - Resume previous diet.                           - Continue present medications.                           - Await pathology results. Procedure Code(s):        --- Professional ---                           [id number], Esophagogastroduodenoscopy, flexible,                            transoral; with biopsy, single or multiple Diagnosis Code(s):        --- Professional ---                           K29.70, Gastritis, unspecified, without bleeding                           R10.13, Epigastric pain                            R11.0, Nausea CPT copyright 2022 American Medical Association. All rights reserved. The codes documented in this report are preliminary and upon coder review may  be revised to meet current compliance requirements. Terril Fetters, MD Terril Fetters, MD 05/22/2023 8:58:02 AM This report has been signed electronically. Number of Addenda: 0

## 2023-05-22 NOTE — Anesthesia Postprocedure Evaluation (Signed)
 Anesthesia Post Note  Patient: Kathryn Meyers  Procedure(s) Performed: COLONOSCOPY EGD (ESOPHAGOGASTRODUODENOSCOPY)  Patient location during evaluation: PACU Anesthesia Type: General Level of consciousness: awake and alert Pain management: pain level controlled Vital Signs Assessment: post-procedure vital signs reviewed and stable Respiratory status: spontaneous breathing, nonlabored ventilation, respiratory function stable and patient connected to nasal cannula oxygen Cardiovascular status: blood pressure returned to baseline and stable Postop Assessment: no apparent nausea or vomiting Anesthetic complications: no   There were no known notable events for this encounter.   Last Vitals:  Vitals:   05/22/23 0808 05/22/23 0930  BP: (!) 155/86 (!) 107/56  Pulse: 83 85  Resp: 16 19  Temp: 36.6 C 36.4 C  SpO2: 98% 100%    Last Pain:  Vitals:   05/22/23 0930  TempSrc: Oral  PainSc: 0-No pain                 Ezmeralda Stefanick L Terrill Wauters

## 2023-05-23 ENCOUNTER — Encounter (HOSPITAL_COMMUNITY): Payer: Self-pay | Admitting: Gastroenterology

## 2023-05-24 LAB — SURGICAL PATHOLOGY

## 2023-05-24 NOTE — Progress Notes (Signed)
 I reviewed the pathology results. [name], can you send her a letter with the findings as described below please? Repeat colonoscopy in 3 years  Thanks,  Elinore Shults Faizan Hasset Chaviano, MD Gastroenterology and Hepatology [facility]  ---------------------------------------------------------------------------------------------  [facility] [address], [place], [place] [address] Phone:  [phone]   05/24/23 [place], [place]   Dear Kathryn Meyers,  I am writing to inform you that the biopsies taken during your recent endoscopic examination showed:  A. STOMACH, BIOPSY:  Reactive gastropathy with focal chronic gastritis  Helicobacter stain negative (IHC, adequate control)  Negative for intestinal metaplasia, dysplasia and carcinoma   B. CECUM, TRANSVERSE AND DESCENDING COLON, POLYPECTOMY:  Tubular adenoma, multiple fragments  Negative for high-grade dysplasia and carcinoma   What does this mean?  You had a total of 8 polyps removed. The pathology came back as "tubular adenoma." These findings are NOT cancer, but had the polyps remained in your colon, they could have turned into cancer.  Given these findings, it is recommended that your next colonoscopy be performed in 3 years.  Also upper endoscopy biopsy did not show anything significant . Please continue to see us  in GI clinic   Also I value your feedback , so if you get a survey , please take the time to fill it out and thank you for choosing [facility]  Please call us  at [phone] if you have persistent problems or have questions about your condition that have not been fully answered at this time.  Sincerely,  Shirin Echeverry Faizan Kaliana Albino, MD Gastroenterology and Hepatology

## 2023-05-30 ENCOUNTER — Encounter (INDEPENDENT_AMBULATORY_CARE_PROVIDER_SITE_OTHER): Payer: Self-pay | Admitting: *Deleted

## 2023-06-06 ENCOUNTER — Ambulatory Visit: Attending: Cardiology | Admitting: Cardiology

## 2023-06-06 ENCOUNTER — Encounter: Payer: Self-pay | Admitting: Cardiology

## 2023-06-06 VITALS — BP 114/74 | HR 78 | Ht <= 58 in | Wt 137.4 lb

## 2023-06-06 DIAGNOSIS — I700 Atherosclerosis of aorta: Secondary | ICD-10-CM | POA: Insufficient documentation

## 2023-06-06 DIAGNOSIS — I4711 Inappropriate sinus tachycardia, so stated: Secondary | ICD-10-CM | POA: Insufficient documentation

## 2023-06-06 DIAGNOSIS — I10 Essential (primary) hypertension: Secondary | ICD-10-CM | POA: Insufficient documentation

## 2023-06-06 NOTE — Progress Notes (Signed)
    Cardiology Office Note  Date: 06/06/2023   ID: Anjel, Nunnally 02/15/1945, MRN [id number]  History of Present Illness: Kathryn Meyers is a 78 y.o. female last seen in March 2024.  She is here with her husband for a routine visit.  She reports an occasional sense of palpitations, no progressive symptoms or associated chest pain/syncope.  She reports chronic pain related to rheumatoid arthritis and has had difficulty with treatment, including what sounds like a reaction to methotrexate.  We went over her medications.  She remains on Lopressor  50 mg twice daily.  Blood pressure normal today on Aldactone 25 mg daily.  I reviewed her ECG today which shows sinus rhythm with short PR interval as before, chronic ST segment abnormalities.  Physical Exam: VS:  BP 114/74 (BP Location: Left Arm, Patient Position: Sitting, Cuff Size: Normal)   Pulse 78   Ht 4\' 8"  (1.422 m)   Wt 137 lb 6.4 oz (62.3 kg)   SpO2 95%   BMI 30.80 kg/m , BMI Body mass index is 30.8 kg/m.  Wt Readings from Last 3 Encounters:  06/06/23 137 lb 6.4 oz (62.3 kg)  05/22/23 134 lb (60.8 kg)  04/26/23 135 lb 14.4 oz (61.6 kg)    General: Patient appears comfortable at rest. HEENT: Conjunctiva and lids normal. Neck: Supple, no elevated JVP or carotid bruits. Lungs: Clear to auscultation, nonlabored breathing at rest. Cardiac: Regular rate and rhythm, no S3 or significant systolic murmur, no pericardial rub.  ECG:  An ECG dated 03/25/2022 was personally reviewed today and demonstrated:  Sinus rhythm with short PR interval, no delta waves, chronic ST-T wave abnormalities.  Labwork:  December 2024: BUN 4, creatinine 0.64, potassium 3, GFR 92, AST 19, ALT 15, cholesterol 166, triglycerides 148, HDL 44, LDL 96, hemoglobin A1c 6%  Other Studies Reviewed Today:  No interval cardiac testing for review today.  Assessment and Plan:  1.  Inappropriate sinus tachycardia.  Heart rate normal range today on Lopressor  50  mg twice daily.  ECG reviewed without significant changes.  Continue with observation.   2.  Aortic atherosclerosis by CT imaging.  She has declined statin therapy.   3.  Primary hypertension.  Blood pressure well-controlled today on Aldactone 25 mg daily.  I reviewed her interval lab work.  Disposition:  Follow up 1 year.  Signed, Gerard Knight, M.D., F.A.C.C. [facility]

## 2023-06-06 NOTE — Patient Instructions (Signed)

## 2023-06-24 ENCOUNTER — Other Ambulatory Visit: Payer: Self-pay | Admitting: Cardiology

## 2023-09-29 ENCOUNTER — Encounter (INDEPENDENT_AMBULATORY_CARE_PROVIDER_SITE_OTHER): Payer: Self-pay | Admitting: Gastroenterology

## 2023-10-04 ENCOUNTER — Telehealth: Payer: Self-pay | Admitting: Cardiology

## 2023-10-04 MED ORDER — METOPROLOL TARTRATE 50 MG PO TABS: 50.0000 mg | ORAL_TABLET | Freq: Two times a day (BID) | ORAL | 2 refills | Status: AC

## 2023-10-04 NOTE — Telephone Encounter (Signed)
*  STAT* If patient is at the pharmacy, call can be transferred to refill team.   1. Which medications need to be refilled? (please list name of each medication and dose if known)   metoprolol  tartrate (LOPRESSOR ) 50 MG tablet     2. Would you like to learn more about the convenience, safety, & potential cost savings by using the [facility]? No    3. Are you open to using the [facility] (Type [facility]. ). No   4. Which pharmacy/location (including street and city if local pharmacy) is medication to be sent to? [id number] - [place], [place] - [address]    5. Do they need a 30 day or 90 day supply? 90 day   Pharmacy accidentally filled pt's prescription for 25 mg of medication rather than 50 mg, so they had to cancel the prescription on file for research reasons. Pharmacy is requesting we send back in a refill request on pt's behalf.

## 2023-10-04 NOTE — Telephone Encounter (Signed)
 Pt's medication was resent to pt's pharmacy as requested. Confirmation received.

## 2023-10-23 ENCOUNTER — Ambulatory Visit (INDEPENDENT_AMBULATORY_CARE_PROVIDER_SITE_OTHER): Admitting: Gastroenterology

## 2023-10-23 ENCOUNTER — Encounter (INDEPENDENT_AMBULATORY_CARE_PROVIDER_SITE_OTHER): Payer: Self-pay | Admitting: Gastroenterology

## 2023-10-23 VITALS — BP 137/82 | HR 65 | Temp 96.8°F | Ht <= 58 in | Wt 132.6 lb

## 2023-10-23 DIAGNOSIS — K582 Mixed irritable bowel syndrome: Secondary | ICD-10-CM

## 2023-10-23 DIAGNOSIS — K219 Gastro-esophageal reflux disease without esophagitis: Secondary | ICD-10-CM

## 2023-10-23 DIAGNOSIS — Z860101 Personal history of adenomatous and serrated colon polyps: Secondary | ICD-10-CM | POA: Diagnosis not present

## 2023-10-23 DIAGNOSIS — Z860100 Personal history of colon polyps, unspecified: Secondary | ICD-10-CM

## 2023-10-23 DIAGNOSIS — K5904 Chronic idiopathic constipation: Secondary | ICD-10-CM

## 2023-10-23 NOTE — Progress Notes (Signed)
 Regan Llorente Faizan Lily Kernen , M.D. Gastroenterology & Hepatology [facility] Gastroenterology [address] [place], [place] [address] Primary Care Physician: Orpha Yancey LABOR, MD [address] [place] [address]  Chief Complaint:  IBD-Mixed type , Colon polyps   History of Present Illness:  Kathryn Meyers is a 78 y.o. female  with past medical history of IBS-Mixed, GERD, choledocholithiasis status post sphincterotomy and stone removal, who presents with complaints of  IBD-Mixed type , Colon polyps   Patient was last seen 04/2023 where she underwent upper endoscopy and colonoscopy with removal of 8 polyps which were tubular adenomas.  Patient is here for follow-up Patient is accompanying with husband in the clinic .  Patient reports that her complaints of diffuse abdominal pain and altered bowel movements with blood per rectum all has resolved after starting Metamucil/MiraLAX  and IBgard .   Underwent a CT abdomen and pelvis with IV contrast on 04/2021 at outside facility which showed fatty liver without any other abnormalities.   The patient denies having any  fever, chills, hematochezia, melena, hematemesis, abdominal distention,  jaundice, pruritus or weight loss.  Labs from 12/2022 hemoglobin A1c 6.0 AST 19 ALT 15 alk phos 80 T. bili 0.7 Last EGD: 06/30/2017  The Z-line was regular and was found 34 cm from the incisors. A 2 cm hiatal hernia was present. Patchy mild inflammation characterized by erythema and granularity was found in the gastric antrum. The exam was otherwise without abnormality. Patchy mild inflammation characterized by congestion (edema) and erythema was found in the duodenal bulb.   Last Colonoscopy: 04/2023  - One 10 mm polyp in the cecum, removed with mucosal resection. Resected and retrieved. - Seven 4 to 8 mm polyps in the descending colon, in the transverse colon and in the ascending colon, removed with a cold snare. Resected and  retrieved. - Diverticulosis in the left colon. - Mucosal resection was performed. Resection and retrieval were complete.  Repeat 3 years   A. STOMACH, BIOPSY:  Reactive gastropathy with focal chronic gastritis  Helicobacter stain negative (IHC, adequate control)  Negative for intestinal metaplasia, dysplasia and carcinoma    B. CECUM, TRANSVERSE AND DESCENDING COLON, POLYPECTOMY:  Tubular adenoma, multiple fragments  Negative for high-grade dysplasia and carcinoma   04/2023  - Normal esophagus. - Gastritis. Biopsied. - Normal duodenal bulb and second portion of the duodenum.  12/27/2017, small polyp in the cecum resected with cold forceps, 3 polyps in the mid transverse colon resected with a cold snare, 3 polyps in the splenic flexure presenting with cold snare, diverticulosis.  All polyps were less than 1 cm.  External and internal hemorrhoids.  Pathology showed multiple fragments of tubular adenomas.   Recommended to repeat in December 2024  Daughter had colon cancer at age 7  Past Medical History: Past Medical History:  Diagnosis Date   Acid reflux    Anxiety    Arthritis    Colitis    Glaucoma    Gout    History of skin cancer    Inappropriate sinus tachycardia    Nail patellar syndrome    Abnormal nails of the hands usually the thumb, absent or hypertrophic patella, abnormal elbows, autosomal dominant, must be very careful with renal function   Shingles     Past Surgical History: Past Surgical History:  Procedure Laterality Date   BILIARY STENT PLACEMENT N/A 03/10/2017   Procedure: BILIARY STENT PLACEMENT;  Surgeon: Golda Claudis PENNER, MD;  [facility];  Service: Gastroenterology;  Laterality: N/A;   BIOPSY  06/30/2017   Procedure: BIOPSY AMPULA;  Surgeon: Golda Claudis PENNER, MD;  [facility];  Service: Gastroenterology;;   CATARACT EXTRACTION Bilateral    CESAREAN SECTION     x 4   CHOLECYSTECTOMY  1982   cholecystitis   COLONOSCOPY N/A  06/19/2014   Procedure: COLONOSCOPY;  Surgeon: Claudis PENNER Golda, MD;  [facility];  Service: Endoscopy;  Laterality: N/A;  730 - moved to 5/26 @ 12:00   COLONOSCOPY N/A 09/26/2014   Procedure: COLONOSCOPY;  Surgeon: Claudis PENNER Golda, MD;  [facility];  Service: Endoscopy;  Laterality: N/A;  1020 - moved to 11:15 - [name] to notify pt   COLONOSCOPY N/A 12/27/2017   Procedure: COLONOSCOPY;  Surgeon: Golda Claudis PENNER, MD;  [facility];  Service: Endoscopy;  Laterality: N/A;  12:00   COLONOSCOPY N/A 05/22/2023   Procedure: COLONOSCOPY;  Surgeon: Cinderella Deatrice FALCON, MD;  [facility];  Service: Endoscopy;  Laterality: N/A;  9:15AM;ASA 1-2   ERCP N/A 03/10/2017   Procedure: ENDOSCOPIC RETROGRADE CHOLANGIOPANCREATOGRAPHY (ERCP) With sphincterotomy and stone extraction.;  Surgeon: Golda Claudis PENNER, MD;  [facility];  Service: Gastroenterology;  Laterality: N/A;   ERCP N/A 06/30/2017   Procedure: ENDOSCOPIC RETROGRADE CHOLANGIOPANCREATOGRAPHY (ERCP);  Surgeon: Golda Claudis PENNER, MD;  [facility];  Service: Endoscopy;  Laterality: N/A;  pt knows to arrive at 10:20   ESOPHAGOGASTRODUODENOSCOPY N/A 05/22/2023   Procedure: EGD (ESOPHAGOGASTRODUODENOSCOPY);  Surgeon: Cinderella Deatrice FALCON, MD;  [facility];  Service: Endoscopy;  Laterality: N/A;  9:15AM;ASA 1-2   ESOPHAGOGASTRODUODENOSCOPY ENDOSCOPY  06/30/2017   Procedure: ESOPHAGOGASTRODUODENOSCOPY ENDOSCOPY;  Surgeon: Golda Claudis PENNER, MD;  [facility];  Service: Endoscopy;;   GASTROINTESTINAL STENT REMOVAL N/A 06/30/2017   Procedure: GASTROINTESTINAL STENT REMOVAL;  Surgeon: Golda Claudis PENNER, MD;  [facility];  Service: Endoscopy;  Laterality: N/A;   Growth in ear removed     1994   KNEE SURGERY Right    POLYPECTOMY  12/27/2017   Procedure: POLYPECTOMY;  Surgeon: Golda Claudis PENNER, MD;  [facility];  Service: Endoscopy;;  polyps at splenic flexure x3, cecal  polyp, transverse polyps x3   REMOVAL OF STONES  06/30/2017   Procedure: REMOVAL OF STONES FRAGMENTS ;  Surgeon: Golda Claudis PENNER, MD;  [facility];  Service: Gastroenterology;;   rt knee replacement     2017   SPHINCTEROTOMY N/A 03/10/2017   Procedure: SPHINCTEROTOMY;  Surgeon: Golda Claudis PENNER, MD;  [facility];  Service: Gastroenterology;  Laterality: N/A;   stent eye Left    STONE EXTRACTION WITH BASKET N/A 03/10/2017   Procedure: STONE EXTRACTION WITH BASKET;  Surgeon: Golda Claudis PENNER, MD;  [facility];  Service: Gastroenterology;  Laterality: N/A;   TONSILLECTOMY     age 89    Family History: Family History  Problem Relation Age of Onset   Dementia Mother    Alcoholism Father     Social History: Social History   Tobacco Use  Smoking Status Every Day   Current packs/day: 1.00   Average packs/day: 1 pack/day for 46.6 years (46.6 ttl pk-yrs)   Types: Cigarettes   Start date: 03/21/1977   Passive exposure: Current  Smokeless Tobacco Never  Tobacco Comments   smokes 1 pack per day 11/27/2019   Social History   Substance and Sexual Activity  Alcohol Use No  Alcohol/week: 0.0 standard drinks of alcohol   Social History   Substance and Sexual Activity  Drug Use No    Allergies: Allergies  Allergen Reactions   Methotrexate Shortness Of Breath   Nsaids Other (See Comments)    Rectal bleeding.   Cortisone Other (See Comments)    Dizzy/flush/rapid heart beat.   Demerol  [Meperidine ] Nausea And Vomiting   Hydroxychloroquine Other (See Comments)    Medications: Current Outpatient Medications  Medication Sig Dispense Refill   acetaminophen  (TYLENOL ) 500 MG tablet Take 1,000 mg by mouth daily as needed for moderate pain.      albuterol  (VENTOLIN  HFA) 108 (90 Base) MCG/ACT inhaler SMARTSIG:1 Puff(s) Via Inhaler Every 4 Hours PRN     cholecalciferol (VITAMIN D3) 25 MCG (1000 UNIT) tablet Take 1,000 Units by mouth daily.      Cyanocobalamin (VITAMIN B-12 IJ) Inject 1 Dose as directed every 30 (thirty) days.      diazepam (VALIUM) 10 MG tablet Take 2.5 mg by mouth at bedtime as needed for sleep.     famotidine  (PEPCID ) 20 MG tablet Take 20 mg by mouth at bedtime as needed for heartburn or indigestion. As needed     Fluticasone-Umeclidin-Vilant (TRELEGY ELLIPTA IN) Inhale into the lungs.     hydrocortisone (ANUSOL-HC) 25 MG suppository Place 25 mg rectally. As needed     metoprolol  tartrate (LOPRESSOR ) 50 MG tablet Take 1 tablet (50 mg total) by mouth 2 (two) times daily. 180 tablet 2   ondansetron  (ZOFRAN ) 4 MG tablet Take 1 tablet (4 mg total) by mouth 2 (two) times daily as needed for nausea or vomiting. 20 tablet 0   oxyCODONE (OXY IR/ROXICODONE) 5 MG immediate release tablet Take 5 mg by mouth daily as needed. 2.5 mg prn     pantoprazole  (PROTONIX ) 40 MG tablet TAKE 1 TABLET BY MOUTH DAILY BEFORE BREAKFAST 90 tablet 3   spironolactone (ALDACTONE) 25 MG tablet Take 25 mg by mouth daily.      No current facility-administered medications for this visit.    Review of Systems: GENERAL: negative for malaise, night sweats HEENT: No changes in hearing or vision, no nose bleeds or other nasal problems. NECK: Negative for lumps, goiter, pain and significant neck swelling RESPIRATORY: Negative for cough, wheezing CARDIOVASCULAR: Negative for chest pain, leg swelling, palpitations, orthopnea GI: SEE HPI MUSCULOSKELETAL: Negative for joint pain or swelling, back pain, and muscle pain. SKIN: Negative for lesions, rash HEMATOLOGY Negative for prolonged bleeding, bruising easily, and swollen nodes. ENDOCRINE: Negative for cold or heat intolerance, polyuria, polydipsia and goiter. NEURO: negative for tremor, gait imbalance, syncope and seizures. The remainder of the review of systems is noncontributory.   Physical Exam: BP 137/82   Pulse 65   Temp (!) 96.8 F (36 C)   Ht 4' 8 (1.422 m)   Wt 132 lb 9.6 oz (60.1 kg)    BMI 29.73 kg/m  GENERAL: The patient is AO x3, in no acute distress. HEENT: Head is normocephalic and atraumatic. EOMI are intact. Mouth is well hydrated and without lesions. NECK: Supple. No masses LUNGS: Clear to auscultation. No presence of rhonchi/wheezing/rales. Adequate chest expansion HEART: RRR, normal s1 and s2. ABDOMEN: Soft, nontender, no guarding, no peritoneal signs, and nondistended. BS +. No masses.  Imaging/Labs: as above     Latest Ref Rng & Units 04/08/2021   10:55 AM 10/24/2018   11:56 AM 06/23/2017   11:31 AM  CBC  WBC 3.8 - 10.8 Thousand/uL 12.8  13.5  10.9  Hemoglobin 11.7 - 15.5 g/dL 83.5  84.8  84.6   Hematocrit 35.0 - 45.0 % 49.0  45.7  46.2   Platelets 140 - 400 Thousand/uL 415  431  375    No results found for: IRON, TIBC, FERRITIN  I personally reviewed and interpreted the available labs, imaging and endoscopic files.  Impression and Plan: MICHALLA RINGER is a 78 y.o. female  with past medical history of IBS-Mixed, GERD, choledocholithiasis status post sphincterotomy and stone removal, who presents with complaints of  IBD-Mixed type , Colon polyps   #IBS-M #Nausea-resolved  #Painless Hematochezia-resolved # Colon polyps   Patient has diffuse abdominal discomfort with altered bowel movements.  This is likely IBS mixed type.  Since starting Metamucil/MiraLAX  and IBgard patient's symptoms have completely resolved  Hematochezia has also resolved patient is up-to-date with colonoscopy with 8 tubular adenoma removed  Large colon polyp burden reports at 1 time and 23 polyps.  Daughter with colon cancer at age 46  Next suggested repeat colonoscopy 3 years Metamucil/MiraLAX  IBgard 1-2 times daily  All questions were answered.      Fran Neiswonger Faizan Breshae Belcher, MD Gastroenterology and Hepatology [facility]   This chart has been completed using [facility] Dictation software, and while attempts have been made to  ensure accuracy , certain words and phrases may not be transcribed as intended

## 2023-11-08 ENCOUNTER — Encounter (INDEPENDENT_AMBULATORY_CARE_PROVIDER_SITE_OTHER): Payer: Self-pay | Admitting: Gastroenterology
